# Patient Record
Sex: Female | Born: 1935 | Race: White | Hispanic: No | Smoking: Former smoker
Health system: Southern US, Community
[De-identification: ages and names within clinical notes are randomized; demographics above are authoritative.]

## PROBLEM LIST (undated history)

## (undated) DIAGNOSIS — F32A Depression, unspecified: Secondary | ICD-10-CM

## (undated) DIAGNOSIS — M545 Low back pain, unspecified: Secondary | ICD-10-CM

## (undated) DIAGNOSIS — F329 Major depressive disorder, single episode, unspecified: Secondary | ICD-10-CM

## (undated) DIAGNOSIS — L570 Actinic keratosis: Secondary | ICD-10-CM

## (undated) DIAGNOSIS — M722 Plantar fascial fibromatosis: Secondary | ICD-10-CM

## (undated) DIAGNOSIS — K5790 Diverticulosis of intestine, part unspecified, without perforation or abscess without bleeding: Secondary | ICD-10-CM

## (undated) DIAGNOSIS — J449 Chronic obstructive pulmonary disease, unspecified: Secondary | ICD-10-CM

## (undated) DIAGNOSIS — E785 Hyperlipidemia, unspecified: Secondary | ICD-10-CM

## (undated) DIAGNOSIS — I10 Essential (primary) hypertension: Secondary | ICD-10-CM

## (undated) DIAGNOSIS — H35351 Cystoid macular degeneration, right eye: Secondary | ICD-10-CM

## (undated) HISTORY — DX: Hyperlipidemia, unspecified: E78.5

## (undated) HISTORY — PX: ABDOMINAL HYSTERECTOMY: SHX81

## (undated) HISTORY — PX: VESICOVAGINAL FISTULA CLOSURE W/ TAH: SUR271

## (undated) HISTORY — DX: Low back pain: M54.5

## (undated) HISTORY — DX: Actinic keratosis: L57.0

## (undated) HISTORY — DX: Low back pain, unspecified: M54.50

## (undated) HISTORY — DX: Cystoid macular degeneration, right eye: H35.351

## (undated) HISTORY — DX: Depression, unspecified: F32.A

## (undated) HISTORY — DX: Chronic obstructive pulmonary disease, unspecified: J44.9

## (undated) HISTORY — DX: Plantar fascial fibromatosis: M72.2

## (undated) HISTORY — DX: Essential (primary) hypertension: I10

## (undated) HISTORY — PX: COLONOSCOPY: SHX174

## (undated) HISTORY — DX: Diverticulosis of intestine, part unspecified, without perforation or abscess without bleeding: K57.90

## (undated) HISTORY — DX: Major depressive disorder, single episode, unspecified: F32.9

---
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
## 2004-10-21 ENCOUNTER — Ambulatory Visit: Payer: Self-pay

## 2005-09-29 ENCOUNTER — Ambulatory Visit: Payer: Self-pay

## 2006-03-08 ENCOUNTER — Ambulatory Visit: Payer: Self-pay | Admitting: Ophthalmology

## 2006-03-14 ENCOUNTER — Ambulatory Visit: Payer: Self-pay | Admitting: Ophthalmology

## 2006-10-05 ENCOUNTER — Ambulatory Visit: Payer: Self-pay

## 2007-01-18 ENCOUNTER — Ambulatory Visit: Payer: Self-pay

## 2007-06-29 ENCOUNTER — Ambulatory Visit: Payer: Self-pay

## 2007-10-18 ENCOUNTER — Ambulatory Visit: Payer: Self-pay

## 2008-02-05 ENCOUNTER — Ambulatory Visit: Payer: Self-pay | Admitting: Internal Medicine

## 2008-02-25 ENCOUNTER — Ambulatory Visit: Payer: Self-pay | Admitting: Gastroenterology

## 2008-07-03 ENCOUNTER — Ambulatory Visit: Payer: Self-pay | Admitting: Internal Medicine

## 2008-11-05 ENCOUNTER — Ambulatory Visit: Payer: Self-pay | Admitting: Internal Medicine

## 2009-02-26 ENCOUNTER — Ambulatory Visit: Payer: Self-pay | Admitting: Gastroenterology

## 2009-09-23 ENCOUNTER — Ambulatory Visit: Payer: Self-pay | Admitting: Internal Medicine

## 2009-11-10 ENCOUNTER — Ambulatory Visit: Payer: Self-pay | Admitting: Internal Medicine

## 2010-03-02 ENCOUNTER — Ambulatory Visit: Payer: Self-pay | Admitting: Internal Medicine

## 2010-06-02 ENCOUNTER — Encounter: Payer: Self-pay | Admitting: Cardiology

## 2010-09-09 ENCOUNTER — Encounter: Payer: Self-pay | Admitting: Cardiology

## 2010-09-14 ENCOUNTER — Ambulatory Visit: Payer: Self-pay | Admitting: Cardiology

## 2010-09-14 DIAGNOSIS — R079 Chest pain, unspecified: Secondary | ICD-10-CM

## 2010-09-18 HISTORY — PX: NM MYOVIEW LTD: HXRAD82

## 2010-09-28 ENCOUNTER — Ambulatory Visit: Payer: Self-pay | Admitting: Cardiovascular Disease

## 2010-09-28 ENCOUNTER — Ambulatory Visit: Payer: Self-pay | Admitting: Cardiology

## 2010-09-30 ENCOUNTER — Ambulatory Visit: Payer: Self-pay | Admitting: Cardiovascular Disease

## 2010-09-30 DIAGNOSIS — R0602 Shortness of breath: Secondary | ICD-10-CM | POA: Insufficient documentation

## 2010-09-30 DIAGNOSIS — E785 Hyperlipidemia, unspecified: Secondary | ICD-10-CM | POA: Insufficient documentation

## 2010-11-17 ENCOUNTER — Ambulatory Visit: Payer: Self-pay | Admitting: Internal Medicine

## 2011-01-18 NOTE — Assessment & Plan Note (Signed)
Summary: EC6/AMD   Primary Provider:  Duncan Dull, M.D.  CC:  f/u after stress test.  History of Present Illness: 76 yo with history of HTN and hyperlipidemia , episodes of left arm tingling, With recent stress test at [facility] who presents for routine followup.  She had a treadmill Myoview. She only exercised for a very short period, 2 minutes though she did achieve her target heart rate. There is no ischemia seen on perfusion imaging. Normal ejection fraction. Overall a low risk study.  She presents today and reports that she now has fever. As Monday, she has had worsening cough, congestion. She feels that from her chest line up, she cannot breathe. She reports having previous episodes last year requiring antibiotics and prednisone. She states having bronchopneumonia in the past. She does have a long smoking history though did stop smoking 20 years ago She denies any chest pain.  ECG: NSR, normal  Labs (6/11): K 4.1, creatinine 0.9, LFTs normal, TSH normal Labs (9/11): HDL 70, LDL 112, TGs 148   Current Medications (verified): 1)  Hydrochlorothiazide 12.5 Mg Tabs (Hydrochlorothiazide) .... Take One Tablet By Mouth Daily. 2)  Simvastatin 40 Mg Tabs (Simvastatin) .... One Tablet At Bedtime 3)  Omeprazole 40 Mg Cpdr (Omeprazole) .... One Tablet Once Daily 4)  Vasotec 5 Mg Tabs (Enalapril Maleate) .... One Tablet Once Daily 5)  Zoloft 25 Mg Tabs (Sertraline Hcl) .... Take 1 Tablet By Mouth Once A Day As Needed 6)  Alprazolam 0.5 Mg Tabs (Alprazolam) .... One Tablet Once Daily 7)  Proventil Hfa 108 (90 Base) Mcg/act Aers (Albuterol Sulfate) .... As Needed 8)  Nystatin 100000 Unit/gm Oint (Nystatin) 9)  Aspir-Low 81 Mg Tbec (Aspirin) .... One Tablet Once Daily 10)  Vitamin D3 400 Unit Tabs (Cholecalciferol) .... One Tablet Once Daily  Allergies (verified): 1)  ! Codeine 2)  ! Sulfa 3)  ! Darvocet  Past History:  Past Medical History: Last updated: 09/14/2010 1. cholelithiasis:  asymptomatic 2. osteoporosis 3. plantar fascitis/Morton's Neuroma 4. Actinic Keratosis: Back, Upper extremities: s/p liquid nitro 01/29/2006 5. diverticulitis 6. colonoscopy March 2009, one sigmoid polyp, no tics 7. Hyperlipidemia 8. Hypertension 9. Depression 10. Hysterectomy 11. Low back pain  Past Surgical History: Last updated: 09/14/2010 hysterectomy  Family History: Last updated: 09/14/2010 Mother:  CAD; first MI in 29's, s/p CABG x 3, [place] ([facility]) Family History of Cancer: Mother, nonmetatastic 50. Father: Aortic Aneurysm, mini strokes, dementia  Social History: Last updated: 09/14/2010 Widowed, lives in [place] Tobacco Use - Former. Quit 20 years ago. Alcohol Use - no Regular Exercise - yes--works in yard. Drug Use - no  Risk Factors: Exercise: yes (09/14/2010)  Risk Factors: Smoking Status: quit (09/14/2010)  Review of Systems       The patient complains of dyspnea on exertion.  The patient denies fever, weight loss, weight gain, vision loss, decreased hearing, hoarseness, chest pain, syncope, peripheral edema, prolonged cough, abdominal pain, incontinence, muscle weakness, depression, and enlarged lymph nodes.         cough, "can't breath"  Vital Signs:  Patient profile:   76 year old female Height:      61 inches Weight:      126 pounds BMI:     23.89 Pulse rate:   96 / minute BP sitting:   124 / 72  (left arm) Cuff size:   regular  Vitals Entered By: Benedict Needy, RN (September 30, 2010 12:08 PM)  Physical Exam  General:  Well developed, well nourished,  in no acute distress. thin elderly Head:  normocephalic and atraumatic Neck:  Neck supple, no JVD. No masses, thyromegaly or abnormal cervical nodes. Lungs:  mildly decreased breath sounds throughout, moderately decreased at the bases Heart:  Non-displaced PMI, chest non-tender; regular rate and rhythm, S1, S2 without murmurs, rubs or gallops. Carotid upstroke normal, no bruit. Pedals  normal pulses. No edema, no varicosities. Abdomen:  Bowel sounds positive; abdomen soft and non-tender without masses Msk:  Back normal, normal gait. Muscle strength and tone normal. Pulses:  pulses normal in all 4 extremities Extremities:  No clubbing or cyanosis. Neurologic:  Alert and oriented x 3. Skin:  Intact without lesions or rashes. Psych:  Normal affect.   Impression & Recommendations:  Problem # 1:  DYSPNEA (ICD-786.05) she has worsening shortness of breath, cough, fever. Symptoms are similar to previous episodes of bronchitis though I cannot exclude a viral etiology. Given her significant COPD and smoking history, we will start her on a Z-Pak and prednisone taper. She would like to start today and not wait until tomorrow when she sees Dr. Lottie Mussel. She wonders if she might feel well enough tomorrow to not have to go to the doctor thought suggested that she do go in case she does not get better or the next several days.  Her updated medication list for this problem includes:    Hydrochlorothiazide 12.5 Mg Tabs (Hydrochlorothiazide) .Marland Kitchen... Take one tablet by mouth daily.    Vasotec 5 Mg Tabs (Enalapril maleate) ..... One tablet once daily    Aspir-low 81 Mg Tbec (Aspirin) ..... One tablet once daily  Problem # 2:  HYPERLIPIDEMIA-MIXED (ICD-272.4) Cholesterol is well controlled on her current medication regimen.  Her updated medication list for this problem includes:    Simvastatin 40 Mg Tabs (Simvastatin) ..... One tablet at bedtime  Problem # 3:  CHEST PAIN (ICD-786.50) No recent episodes of chest pain. Her left arm numbness and tingling has been stable. This is likely noncardiac and possibly neurologic in the ulnar nerve distribution.  Her Myoview pictures looked excellent with no significant ischemia noted. No further testing needed.  Her updated medication list for this problem includes:    Vasotec 5 Mg Tabs (Enalapril maleate) ..... One tablet once daily    Aspir-low  81 Mg Tbec (Aspirin) ..... One tablet once daily

## 2011-01-18 NOTE — Assessment & Plan Note (Signed)
Summary: NEW PT   Visit Type:  Initial Consult Primary Provider:  Duncan Dull, M.D.  CC:  c/o left and finger numbness; if massages the symptoms go away.  Denies chest pain.  Has shortness of breath that comes and goes..  History of Present Illness: 76 yo with history of HTN and hyperlipidemia presents for evaluation of left arm tingling.  Her entire left arm + the 4th and 5th fingers on the left hand have been tingling periodically.  This has been going on over a 4 week period and has happened 4 times total.  Each time she has been sitting down watching TV. The tingling resolves eventually after she massages the arm.  She does not have her arm in any particular position when this happens.  She does not get arm tingling with exertion.  She lives on an acre of land and does a fair amount of yardwork.  She is short of breath walking up a hill or up steps.  No problems walking on flat ground.  No chest pain.   ECG: NSR, normal  Labs (6/11): K 4.1, creatinine 0.9, LFTs normal, TSH normal Labs (9/11): HDL 70, LDL 112, TGs 148  Preventive Screening-Counseling & Management  Caffeine-Diet-Exercise     Does Patient Exercise: yes      Drug Use:  no.    Current Medications (verified): 1)  Hydrochlorothiazide 12.5 Mg Tabs (Hydrochlorothiazide) .... Take One Tablet By Mouth Daily. 2)  Simvastatin 40 Mg Tabs (Simvastatin) .... One Tablet At Bedtime 3)  Omeprazole 40 Mg Cpdr (Omeprazole) .... One Tablet Once Daily 4)  Vasotec 5 Mg Tabs (Enalapril Maleate) .... One Tablet Once Daily 5)  Zoloft 25 Mg Tabs (Sertraline Hcl) .... One Tablet Once Daily 6)  Allegra Allergy 180 Mg Tabs (Fexofenadine Hcl) .... One Tablet Once Daily 7)  Alprazolam 0.5 Mg Tabs (Alprazolam) .... One Tablet Once Daily 8)  Proventil Hfa 108 (90 Base) Mcg/act Aers (Albuterol Sulfate) .... As Needed 9)  Nystatin 100000 Unit/gm Oint (Nystatin) 10)  Aspir-Low 81 Mg Tbec (Aspirin) .... One Tablet Once Daily 11)  Vitamin D3 400 Unit  Tabs (Cholecalciferol) .... One Tablet Once Daily  Allergies (verified): No Known Drug Allergies  Past History:  Family History: Last updated: 09/14/2010 Mother:  CAD; first MI in 66's, s/p CABG x 3, Glower ([facility]) Family History of Cancer: Mother, nonmetatastic 76. Father: Aortic Aneurysm, mini strokes, dementia  Social History: Last updated: 09/14/2010 Widowed, lives in [place] Tobacco Use - Former. Quit 20 years ago. Alcohol Use - no Regular Exercise - yes--works in yard. Drug Use - no  Risk Factors: Exercise: yes (09/14/2010)  Risk Factors: Smoking Status: quit (09/14/2010)  Past Medical History: 1. cholelithiasis: asymptomatic 2. osteoporosis 3. plantar fascitis/Morton's Neuroma 4. Actinic Keratosis: Back, Upper extremities: s/p liquid nitro 01/29/2006 5. diverticulitis 6. colonoscopy March 2009, one sigmoid polyp, no tics 7. Hyperlipidemia 8. Hypertension 9. Depression 10. Hysterectomy 11. Low back pain  Past Surgical History: hysterectomy  Family History: Reviewed history from 09/14/2010 and no changes required. Mother:  CAD; first MI in 65's, s/p CABG x 3, [place]lower ([facility]) Family History of Cancer: Mother, nonmetatastic 24. Father: Aortic Aneurysm, mini strokes, dementia  Social History: Widowed, lives in [place] Tobacco Use - Former. Quit 20 years ago. Alcohol Use - no Regular Exercise - yes--works in yard. Drug Use - no Does Patient Exercise:  yes Drug Use:  no  Review of Systems       All systems reviewed and negative except  as per HPI.   Vital Signs:  Patient profile:   76 year old female Height:      61 inches Weight:      123 pounds BMI:     23.32 Pulse rate:   75 / minute BP sitting:   102 / 62  (left arm) Cuff size:   regular  Vitals Entered By: Bishop Dublin, CMA (September 14, 2010 2:15 PM)  Physical Exam  General:  Well developed, well nourished, in no acute distress. Head:  normocephalic and atraumatic Nose:  no  deformity, discharge, inflammation, or lesions Mouth:  Teeth, gums and palate normal. Oral mucosa normal. Neck:  Neck supple, no JVD. No masses, thyromegaly or abnormal cervical nodes. Lungs:  Clear bilaterally to auscultation and percussion. Heart:  Non-displaced PMI, chest non-tender; regular rate and rhythm, S1, S2 without murmurs, rubs or gallops. Carotid upstroke normal, no bruit. Pedals normal pulses. No edema, no varicosities. Abdomen:  Bowel sounds positive; abdomen soft and non-tender without masses, organomegaly, or hernias noted. No hepatosplenomegaly. Extremities:  No clubbing or cyanosis. Neurologic:  Alert and oriented x 3. Skin:  Intact without lesions or rashes. Psych:  Normal affect.   Impression & Recommendations:  Problem # 1:  ARM TINGLING Patient is getting arm tingling episodes on left typically when sitting down to wach TV.  Given the involvement of the left 4th and 5th fingers, I wonder if this could be an ulnar neuropathy, though would expect only hand and forearm symptoms (not entire arm).  She does have some risk factors for coronary disease, including HTN and hyperlipidemia.  Given concern for possible atypical coronary disease presentation, will arrange for ETT-myoview.  If this is normal, no further cardiac workup.   Other Orders: Nuclear Stress Test (Nuc Stress Test)  Patient Instructions: 1)  Your physician recommends that you schedule a follow-up appointment in: 2 weeks 2)  Your physician has requested that you have an exercise stress myoview.  For further information please visit [email or web address]  Please follow instruction sheet, as given. 3)  OCT 4TH ARRIVE AT 11:45

## 2011-03-29 ENCOUNTER — Ambulatory Visit: Payer: Self-pay | Admitting: Internal Medicine

## 2011-07-06 ENCOUNTER — Encounter: Payer: Self-pay | Admitting: Cardiovascular Disease

## 2011-08-12 ENCOUNTER — Telehealth: Payer: Self-pay | Admitting: Internal Medicine

## 2011-08-12 NOTE — Telephone Encounter (Signed)
Pt wants to go to [facility] to get her labs done Monday.  She has an appoinment with dr Darrick Huntsman on Tuesday 8/28  Please call pt to advise her  thanks

## 2011-08-15 ENCOUNTER — Other Ambulatory Visit: Payer: Self-pay | Admitting: Internal Medicine

## 2011-08-15 ENCOUNTER — Other Ambulatory Visit: Payer: Self-pay

## 2011-08-15 DIAGNOSIS — E785 Hyperlipidemia, unspecified: Secondary | ICD-10-CM

## 2011-08-15 DIAGNOSIS — R5383 Other fatigue: Secondary | ICD-10-CM

## 2011-08-15 NOTE — Telephone Encounter (Signed)
Patient is asking if order for labs can be sent to [facility] so that she can have her labs drawn there today. Her appt is with you tomorrow.

## 2011-08-16 ENCOUNTER — Encounter: Payer: Self-pay | Admitting: Internal Medicine

## 2011-08-16 ENCOUNTER — Ambulatory Visit (INDEPENDENT_AMBULATORY_CARE_PROVIDER_SITE_OTHER): Payer: Medicare Other | Admitting: Internal Medicine

## 2011-08-16 VITALS — BP 129/85 | HR 79 | Temp 98.8°F | Resp 18 | Wt 133.2 lb

## 2011-08-16 DIAGNOSIS — G4700 Insomnia, unspecified: Secondary | ICD-10-CM | POA: Insufficient documentation

## 2011-08-16 DIAGNOSIS — G8929 Other chronic pain: Secondary | ICD-10-CM

## 2011-08-16 DIAGNOSIS — R0989 Other specified symptoms and signs involving the circulatory and respiratory systems: Secondary | ICD-10-CM

## 2011-08-16 DIAGNOSIS — F419 Anxiety disorder, unspecified: Secondary | ICD-10-CM

## 2011-08-16 DIAGNOSIS — R0602 Shortness of breath: Secondary | ICD-10-CM

## 2011-08-16 DIAGNOSIS — F411 Generalized anxiety disorder: Secondary | ICD-10-CM

## 2011-08-16 MED ORDER — ALPRAZOLAM 1 MG PO TABS: 1.0000 mg | ORAL_TABLET | Freq: Every evening | ORAL | Status: DC | PRN

## 2011-08-16 NOTE — Assessment & Plan Note (Signed)
Secondary to persistent anxiety resulting from estranged relationship with daughter.  She can in crease her alprazolam to 1 mg at bedtime as a trial.

## 2011-08-16 NOTE — Assessment & Plan Note (Signed)
No current symptoms at rest, some exacerbations with recent exertion during hot weather activities.  Continue bronchodilator maintenance therapy and advised to refrain from outdoor activities during summer days when temps are over 85 degrees.

## 2011-08-16 NOTE — Patient Instructions (Signed)
You may increase your alprazolam to 2 tablets at  Bedtime ( = 1 mg ) .Marland Kitchen If this works,  Stage manager the prescription th atI gave you today for 1 mg tablets.  Call me in 1 week if no improvement.

## 2011-08-16 NOTE — Progress Notes (Signed)
  Subjective:    Patient ID: Caitlin Mason, female    DOB: 10/25/1935, 76 y.o.   MRN: [id number]  HPI 76 YO white female with a history of COPD, hypertension and anxiety disorder who presents for general followup.  Due to an estrangement from her daughter she has been having increased insomnia and anxiety. No other complaints.  She still mows her own lawn using a riding mower and has been generally healthy this summer.     Review of Systems  Constitutional: Negative for fever, chills, appetite change, fatigue and unexpected weight change.  HENT: Negative for hearing loss, ear pain, nosebleeds, congestion, sore throat, facial swelling, rhinorrhea, sneezing, mouth sores, trouble swallowing, neck pain, neck stiffness, voice change, postnasal drip, sinus pressure, tinnitus and ear discharge.   Eyes: Negative for pain, discharge, redness and visual disturbance.  Respiratory: Negative for cough, chest tightness, shortness of breath, wheezing and stridor.   Cardiovascular: Negative for chest pain, palpitations and leg swelling.  Gastrointestinal: Negative for nausea, vomiting, abdominal pain, diarrhea, constipation, blood in stool, abdominal distention and anal bleeding.  Genitourinary: Negative for dysuria and flank pain.  Musculoskeletal: Negative for myalgias, arthralgias and gait problem.  Skin: Negative for color change and rash.  Neurological: Negative for dizziness, weakness, light-headedness and headaches.  Hematological: Negative for adenopathy. Does not bruise/bleed easily.  Psychiatric/Behavioral: Negative for suicidal ideas, sleep disturbance and dysphoric mood. The patient is not nervous/anxious.        Objective:   Physical Exam  Constitutional: She is oriented to person, place, and time. She appears well-developed and well-nourished.  HENT:  Mouth/Throat: Oropharynx is clear and moist.  Eyes: EOM are normal. Pupils are equal, round, and reactive to light. No scleral icterus.    Neck: Normal range of motion. Neck supple. No JVD present. No thyromegaly present.  Cardiovascular: Normal rate, regular rhythm, normal heart sounds and intact distal pulses.   Pulmonary/Chest: Effort normal and breath sounds normal.  Abdominal: Soft. Bowel sounds are normal. She exhibits no mass. There is no tenderness.  Musculoskeletal: Normal range of motion. She exhibits no edema.  Lymphadenopathy:    She has no cervical adenopathy.  Neurological: She is alert and oriented to person, place, and time.  Skin: Skin is warm and dry.  Psychiatric: She has a normal mood and affect.          Assessment & Plan:

## 2011-08-17 NOTE — Telephone Encounter (Signed)
Done

## 2011-08-18 ENCOUNTER — Telehealth: Payer: Self-pay | Admitting: Internal Medicine

## 2011-08-18 NOTE — Telephone Encounter (Signed)
Pt called to get results of labs done on Monday 08/15/11 @ [facility]  Please call Friday 8/31

## 2011-08-19 NOTE — Telephone Encounter (Signed)
I have requested labs to be sent over from [facility].  Once they are reviewed I will call patient back.

## 2011-08-25 ENCOUNTER — Telehealth: Payer: Self-pay | Admitting: Internal Medicine

## 2011-08-25 NOTE — Telephone Encounter (Signed)
Notified patient of her lab results

## 2011-08-25 NOTE — Telephone Encounter (Signed)
Message copied by Edd Fabian on Thu Aug 25, 2011  1:02 PM ------      Message from: Duncan Dull      Created: Mon Aug 22, 2011 10:29 PM      Regarding: labs       Her labns were normal including cholesterol, liver, kidney and thyroid

## 2011-09-02 ENCOUNTER — Encounter: Payer: Self-pay | Admitting: Internal Medicine

## 2011-09-30 ENCOUNTER — Other Ambulatory Visit: Payer: Self-pay | Admitting: Internal Medicine

## 2011-09-30 DIAGNOSIS — F419 Anxiety disorder, unspecified: Secondary | ICD-10-CM

## 2011-09-30 MED ORDER — ALPRAZOLAM 1 MG PO TABS: 0.5000 mg | ORAL_TABLET | Freq: Every evening | ORAL | Status: DC | PRN

## 2011-10-19 ENCOUNTER — Telehealth: Payer: Self-pay | Admitting: Internal Medicine

## 2011-10-19 NOTE — Telephone Encounter (Signed)
Spoke with patient, she says that she has been having a lot of pain in her legs. Feels like her bones are dry and brittle. She thinks it is from the simvastatin. She says that her pharmacist told her to stop her simvastatin for a week so she did and she said that her pain improved a lot until she started taking it again. She is asking if there is something else that she could try. Please advise.

## 2011-10-19 NOTE — Telephone Encounter (Signed)
Yes, we can try crestor 5 mg one tablet daily if her insurance will pay for it .  We may have samples and a coupon for decreased copay per lunch rep today.

## 2011-10-19 NOTE — Telephone Encounter (Signed)
[phone]  Pt called she has questions about her simvastatin.  Her bones feels brittle.  Last night she woke up with her hips and legs hurting very bad she had to take 1/2 of goodies powder.

## 2011-10-20 MED ORDER — ROSUVASTATIN CALCIUM 5 MG PO TABS: 5.0000 mg | ORAL_TABLET | Freq: Every day | ORAL | Status: DC

## 2011-10-20 NOTE — Telephone Encounter (Signed)
Patient notified. Rx for crestor sent to pharmacy. Samples left up front for patient to pick up. Patient will pick them up tomorrow.

## 2011-11-07 ENCOUNTER — Other Ambulatory Visit: Payer: Self-pay | Admitting: Internal Medicine

## 2011-11-07 MED ORDER — OMEPRAZOLE 40 MG PO CPDR: 40.0000 mg | DELAYED_RELEASE_CAPSULE | Freq: Every day | ORAL | Status: DC

## 2011-11-07 MED ORDER — HYDROCHLOROTHIAZIDE 12.5 MG PO TABS: 12.5000 mg | ORAL_TABLET | Freq: Every day | ORAL | Status: DC

## 2011-11-28 ENCOUNTER — Other Ambulatory Visit: Payer: Self-pay | Admitting: Internal Medicine

## 2011-11-30 MED ORDER — SERTRALINE HCL 25 MG PO TABS
25.0000 mg | ORAL_TABLET | Freq: Every day | ORAL | Status: DC
Start: 2011-11-28 — End: 2012-11-27

## 2011-12-04 LAB — HM MAMMOGRAPHY: HM Mammogram: NORMAL

## 2011-12-07 ENCOUNTER — Ambulatory Visit: Payer: Self-pay | Admitting: Internal Medicine

## 2012-01-02 ENCOUNTER — Encounter: Payer: Self-pay | Admitting: Internal Medicine

## 2012-01-16 ENCOUNTER — Other Ambulatory Visit: Payer: Self-pay | Admitting: *Deleted

## 2012-01-17 NOTE — Telephone Encounter (Signed)
Opened in error

## 2012-01-19 ENCOUNTER — Telehealth: Payer: Self-pay | Admitting: *Deleted

## 2012-01-19 NOTE — Telephone Encounter (Signed)
Prior Berkley Harvey is needed for omeprazole, for tier exception. Form is in your red folder.

## 2012-01-31 NOTE — Telephone Encounter (Signed)
Prior auth given for omeprazole, approval letter given to doctor for signature and scanning.

## 2012-02-09 ENCOUNTER — Telehealth: Payer: Self-pay | Admitting: Internal Medicine

## 2012-02-09 MED ORDER — ENALAPRIL MALEATE 5 MG PO TABS: 5.0000 mg | ORAL_TABLET | Freq: Every day | ORAL | Status: DC

## 2012-02-09 NOTE — Telephone Encounter (Signed)
Medicine sent to pharmacy.  Patient advised.

## 2012-02-23 ENCOUNTER — Telehealth: Payer: Self-pay | Admitting: Internal Medicine

## 2012-02-23 MED ORDER — GUAIFENESIN-CODEINE 100-10 MG/5ML PO SYRP: 5.0000 mL | ORAL_SOLUTION | Freq: Three times a day (TID) | ORAL | Status: DC | PRN

## 2012-02-23 NOTE — Telephone Encounter (Signed)
Her symptoms suggest the same viral URI that everybody in [place] has currently and she does not need antibiotics unless she is no better by Monday. Have her take benadryl 25 mg every 8 hours,  Sudafed PE 10 mg every 8 hours and call her in cheratussin which has codein in it so she can sleep at night,.  cheratussin rx placed in [facility].  If she doesn't want it bc of the codeine,  You can call her in tessalon 200 mg every 8 hours prn cough  #30 1 refill

## 2012-02-23 NOTE — Telephone Encounter (Signed)
[phone] Pt called wanting to know if dr Darrick Huntsman can call her in something for no fever,chest congestion,head congestion,cough pt has been taking tussin plain no decongestant.  This started 4am onTuesday am [facility]. Pt stated she didn't want to come in if she didn't have to, but if dr Darrick Huntsman wants her to come in she will

## 2012-02-24 NOTE — Telephone Encounter (Signed)
In Caitlin Mason's note patient stated she didn't want to be seen, just something called in.  She told Caitlin Mason she did not want the cough medicine called in.

## 2012-02-24 NOTE — Telephone Encounter (Signed)
Patient called in this morning very upset that she wasn't called back yesterday, I explained that nurse didn't get this until this morning. I did tell her what dr. Darrick Huntsman had wrote and she became even more upset and said "Just because everyone else in [place] had this didn't mean that is what she had and she knew what she needed and that was a SHOT".  I did explain to her that I was sorry she was upset and tried to reassure her, she did take down the information of the Benadryl and sudafed, however she said she wasn't taking any of it.  "she would just suffer through it, even though she knows she just needs a shot".  She doesn't want either cough medication called in.

## 2012-03-06 ENCOUNTER — Other Ambulatory Visit: Payer: Self-pay | Admitting: *Deleted

## 2012-03-06 MED ORDER — OMEPRAZOLE 40 MG PO CPDR: 40.0000 mg | DELAYED_RELEASE_CAPSULE | Freq: Every day | ORAL | Status: DC

## 2012-03-12 ENCOUNTER — Other Ambulatory Visit: Payer: Self-pay | Admitting: Internal Medicine

## 2012-03-12 MED ORDER — HYDROCHLOROTHIAZIDE 12.5 MG PO TABS: 12.5000 mg | ORAL_TABLET | Freq: Every day | ORAL | Status: DC

## 2012-04-03 ENCOUNTER — Ambulatory Visit (INDEPENDENT_AMBULATORY_CARE_PROVIDER_SITE_OTHER): Payer: Medicare Other | Admitting: Internal Medicine

## 2012-04-03 ENCOUNTER — Encounter: Payer: Self-pay | Admitting: Internal Medicine

## 2012-04-03 DIAGNOSIS — R5383 Other fatigue: Secondary | ICD-10-CM

## 2012-04-03 DIAGNOSIS — G4700 Insomnia, unspecified: Secondary | ICD-10-CM

## 2012-04-03 DIAGNOSIS — E785 Hyperlipidemia, unspecified: Secondary | ICD-10-CM

## 2012-04-03 DIAGNOSIS — H35351 Cystoid macular degeneration, right eye: Secondary | ICD-10-CM | POA: Insufficient documentation

## 2012-04-03 DIAGNOSIS — Z79899 Other long term (current) drug therapy: Secondary | ICD-10-CM

## 2012-04-03 DIAGNOSIS — R5381 Other malaise: Secondary | ICD-10-CM

## 2012-04-03 DIAGNOSIS — J449 Chronic obstructive pulmonary disease, unspecified: Secondary | ICD-10-CM

## 2012-04-03 DIAGNOSIS — E559 Vitamin D deficiency, unspecified: Secondary | ICD-10-CM

## 2012-04-03 DIAGNOSIS — R079 Chest pain, unspecified: Secondary | ICD-10-CM

## 2012-04-03 DIAGNOSIS — Z0000 Encounter for general adult medical examination without abnormal findings: Secondary | ICD-10-CM

## 2012-04-03 NOTE — Progress Notes (Signed)
Patient ID: Caitlin Mason, female   DOB: 04/13/1935, 77 y.o.   MRN: [id number] The patient is here for annual Medicare wellness examination and management of other chronic and acute problems.  She recently had a prolonged URI which went untreated because she declined an office visit.  She was initially upset that I would not call her in an antibiotic without seeing her,.  She denies residual symptoms, and has no history of fever, purulent sputum or wheezing.  She continues to express feelings of sadness and anxiety over relationships in her immediate and extended family that have disappointed her,. And is taking zoloft at a very low dose with some improvement in mood    The risk factors are reflected in the social history.  The roster of all physicians providing medical care to patient - is listed in the Snapshot section of the chart.  Activities of daily living:  The patient is 100% independent in all ADLs: dressing, toileting, feeding as well as independent mobility  Home safety : The patient has smoke detectors in the home. They wear seatbelts.  There are no firearms at home. There is no violence in the home.   There is no risks for hepatitis, STDs or HIV. There is no   history of blood transfusion. They have no travel history to infectious disease endemic areas of the world.  The patient has seen their dentist in the last six month. They have seen their eye doctor in the last year. They admit to slight hearing difficulty with regard to whispered voices and some television programs.  They have deferred audiologic testing in the last year.  They do not  have excessive sun exposure. Discussed the need for sun protection: hats, long sleeves and use of sunscreen if there is significant sun exposure.   Diet: the importance of a healthy diet is discussed. They do have a healthy diet.  The benefits of regular aerobic exercise were discussed. She walks 4 times per week ,  20 minutes.   Depression  screen: there are no signs or vegative symptoms of depression- irritability, change in appetite, anhedonia, sadness/tearfullness.  Cognitive assessment: the patient manages all their financial and personal affairs and is actively engaged. They could relate day,date,year and events; recalled 2/3 objects at 3 minutes; performed clock-face test normally.  The following portions of the patient's history were reviewed and updated as appropriate: allergies, current medications, past family history, past medical history,  past surgical history, past social history  and problem list.  Visual acuity was not assessed per patient preference since she has regular follow up with her ophthalmologist. Hearing and body mass index were assessed and reviewed.   During the course of the visit the patient was educated and counseled about appropriate screening and preventive services including : fall prevention , diabetes screening, nutrition counseling, colorectal cancer screening, and recommended immunizations.    BP 122/60  Pulse 70  Temp(Src) 98.2 F (36.8 C) (Oral)  Resp 16  Ht 5\' 1"  (1.549 m)  Wt 130 lb 4 oz (59.081 kg)  BMI 24.61 kg/m2  SpO2 95%   General appearance: alert, cooperative and appears stated age Head: Normocephalic, without obvious abnormality, atraumatic Eyes: conjunctivae/corneas clear. PERRL, EOM's intact. Fundi benign. Ears: normal TM's and external ear canals both ears Nose: Nares normal. Septum midline. Mucosa normal. No drainage or sinus tenderness. Throat: lips, mucosa, and tongue normal; teeth and gums normal Neck: no adenopathy, no carotid bruit, no JVD, supple, symmetrical, trachea midline and thyroid  not enlarged, symmetric, no tenderness/mass/nodules Lungs: clear to auscultation bilaterally Breasts: normal appearance, no masses or tenderness Heart: regular rate and rhythm, S1, S2 normal, no murmur, click, rub or gallop Abdomen: soft, non-tender; bowel sounds normal; no  masses,  no organomegaly Extremities: extremities normal, atraumatic, no cyanosis or edema Pulses: 2+ and symmetric Skin: Skin color, texture, turgor normal. No rashes or lesions Neurologic: Alert and oriented X 3, normal strength and tone. Normal symmetric reflexes. Normal coordination and gait.   Assessment and Plan  COPD (chronic obstructive pulmonary disease) Moderately advanced, with recent exacerbation per patient which was not treated as she refused to be seen. Exam is normal today  CHEST PAIN With prior NM myoview in Oct 2011 negative for ischemia.   Insomnia, persistent Aggravated by anxiety over estrangement from daughter.    Annual physical exam Breast exam done, pelvic deferred.  She is s/p TAH and not sexually active. prior imaging of ovaries by CT done in 2011 for LLQ pain was normal.     Updated Medication List Outpatient Encounter Prescriptions as of 04/03/2012  Medication Sig Dispense Refill  . ALPRAZolam (XANAX) 1 MG tablet Take 0.5 tablets (0.5 mg total) by mouth at bedtime as needed for sleep.  30 tablet  5  . aspirin 81 MG EC tablet Take 81 mg by mouth daily.        . Cholecalciferol (VITAMIN D3) 400 UNITS tablet Take 400 Units by mouth daily.        . enalapril (VASOTEC) 5 MG tablet Take 1 tablet (5 mg total) by mouth daily.  30 tablet  2  . fexofenadine (ALLEGRA) 180 MG tablet Take 180 mg by mouth daily.        . hydrochlorothiazide (HYDRODIURIL) 12.5 MG tablet Take 1 tablet (12.5 mg total) by mouth daily.  30 tablet  3  . omeprazole (PRILOSEC) 40 MG capsule Take 1 capsule (40 mg total) by mouth daily.  30 capsule  3  . sertraline (ZOLOFT) 25 MG tablet Take 1 tablet (25 mg total) by mouth daily.  30 tablet  3  . simvastatin (ZOCOR) 40 MG tablet Take 40 mg by mouth every evening.      Marland Kitchen DISCONTD: albuterol (PROVENTIL HFA) 108 (90 BASE) MCG/ACT inhaler Inhale 2 puffs into the lungs every 6 (six) hours as needed.        Marland Kitchen DISCONTD: rosuvastatin (CRESTOR) 5 MG  tablet Take 1 tablet (5 mg total) by mouth daily.  30 tablet  4

## 2012-04-04 ENCOUNTER — Encounter: Payer: Self-pay | Admitting: Internal Medicine

## 2012-04-04 DIAGNOSIS — Z0000 Encounter for general adult medical examination without abnormal findings: Secondary | ICD-10-CM | POA: Insufficient documentation

## 2012-04-04 DIAGNOSIS — J449 Chronic obstructive pulmonary disease, unspecified: Secondary | ICD-10-CM | POA: Insufficient documentation

## 2012-04-04 NOTE — Assessment & Plan Note (Signed)
With prior NM myoview in Oct 2011 negative for ischemia.

## 2012-04-04 NOTE — Assessment & Plan Note (Signed)
Moderately advanced, with recent exacerbation per patient which was not treated as she refused to be seen. Exam is normal today

## 2012-04-04 NOTE — Assessment & Plan Note (Signed)
Breast exam done, pelvic deferred.  She is s/p TAH and not sexually active. prior imaging of ovaries by CT done in 2011 for LLQ pain was normal.

## 2012-04-04 NOTE — Assessment & Plan Note (Signed)
Aggravated by anxiety over estrangement from daughter.

## 2012-04-11 ENCOUNTER — Other Ambulatory Visit (INDEPENDENT_AMBULATORY_CARE_PROVIDER_SITE_OTHER): Payer: Medicare Other | Admitting: *Deleted

## 2012-04-11 DIAGNOSIS — R5381 Other malaise: Secondary | ICD-10-CM

## 2012-04-11 DIAGNOSIS — R5383 Other fatigue: Secondary | ICD-10-CM

## 2012-04-11 DIAGNOSIS — E785 Hyperlipidemia, unspecified: Secondary | ICD-10-CM

## 2012-04-11 LAB — CBC WITH DIFFERENTIAL/PLATELET
Basophils Absolute: 0 10*3/uL (ref 0.0–0.1)
Eosinophils Absolute: 0.1 10*3/uL (ref 0.0–0.7)
Lymphocytes Relative: 26.2 % (ref 12.0–46.0)
MCHC: 33.3 g/dL (ref 30.0–36.0)
Monocytes Relative: 9.8 % (ref 3.0–12.0)
Neutro Abs: 2.3 10*3/uL (ref 1.4–7.7)
Platelets: 201 10*3/uL (ref 150.0–400.0)
RDW: 13.8 % (ref 11.5–14.6)

## 2012-04-11 LAB — COMPREHENSIVE METABOLIC PANEL
ALT: 11 U/L (ref 0–35)
AST: 24 U/L (ref 0–37)
Alkaline Phosphatase: 46 U/L (ref 39–117)
BUN: 17 mg/dL (ref 6–23)
Chloride: 102 mEq/L (ref 96–112)
Creat: 1.13 mg/dL — ABNORMAL HIGH (ref 0.50–1.10)
Potassium: 4.2 mEq/L (ref 3.5–5.3)

## 2012-04-11 LAB — LIPID PANEL
HDL: 69 mg/dL (ref >39)
LDL Cholesterol: 112 mg/dL — ABNORMAL HIGH (ref 0–99)
Total CHOL/HDL Ratio: 2.9 Ratio

## 2012-04-11 NOTE — Progress Notes (Signed)
Addended by: Jobie Quaker on: 04/11/2012 11:36 AM   Modules accepted: Orders

## 2012-04-12 LAB — VITAMIN D 25 HYDROXY (VIT D DEFICIENCY, FRACTURES): Vit D, 25-Hydroxy: 58 ng/mL (ref 30–89)

## 2012-04-12 LAB — TSH: TSH: 1.622 u[IU]/mL (ref 0.350–4.500)

## 2012-04-18 ENCOUNTER — Ambulatory Visit (INDEPENDENT_AMBULATORY_CARE_PROVIDER_SITE_OTHER): Payer: Medicare Other | Admitting: Internal Medicine

## 2012-04-18 VITALS — BP 118/72 | HR 68 | Temp 97.8°F

## 2012-04-18 DIAGNOSIS — R102 Pelvic and perineal pain: Secondary | ICD-10-CM

## 2012-04-18 DIAGNOSIS — R109 Unspecified abdominal pain: Secondary | ICD-10-CM

## 2012-04-18 DIAGNOSIS — N390 Urinary tract infection, site not specified: Secondary | ICD-10-CM

## 2012-04-18 LAB — POCT URINALYSIS DIPSTICK: pH, UA: 5.5

## 2012-04-19 ENCOUNTER — Encounter: Payer: Self-pay | Admitting: Internal Medicine

## 2012-04-19 DIAGNOSIS — R102 Pelvic and perineal pain: Secondary | ICD-10-CM | POA: Insufficient documentation

## 2012-04-19 NOTE — Progress Notes (Signed)
Patient ID: Caitlin Mason, female   DOB: 1935/09/15, 77 y.o.   MRN: [id number]   Patient Active Problem List  Diagnoses  . HYPERLIPIDEMIA-MIXED  . DYSPNEA  . CHEST PAIN  . Insomnia, persistent  . Cystoid macular degeneration of right eye  . COPD (chronic obstructive pulmonary disease)  . Annual physical exam  . Suprapubic pain, acute    Subjective:  CC:   Chief Complaint  Patient presents with  . Urinary Tract Infection    HPI:   Caitlin Mason a 77 y.o. female who presents Recurrent low suprapubic pain accompanied by frequency. Has been present for 3 days some back pain. No fevers.  has been slightly constipated. No diarrhea   Past Medical History  Diagnosis Date  . Cholelithiasis     asymptomatic  . Osteoporosis   . Plantar fasciitis     Morton's Neuroma  . Actinic keratosis     back, upper extremities: s/p liquid nitro 01/29/06  . Diverticulosis   . HLD (hyperlipidemia)   . HTN (hypertension)   . Depression   . Low back pain   . 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 COPD (chronic obstructive pulmonary disease)     Past Surgical History  Procedure Date  . Colonoscopy     3/09, one sigmoid polyp; no tics   . Vesicovaginal fistula closure w/ tah   . Abdominal hysterectomy   . Nm myoview ltd Oct 2011    normal, EF 89%, study limited by lung disease         The following portions of the patient's history were reviewed and updated as appropriate: Allergies, current medications, and problem list.    Review of Systems:   12 Pt  review of systems was negative except those addressed in the HPI,     History   Social History  . Marital Status: Married    Spouse Name: N/A    Number of Children: N/A  . Years of Education: N/A   Occupational History  . Not on file.   Social History Main Topics  . Smoking status: Former Smoker    Quit date: 08/15/1985  . Smokeless tobacco: Never Used   Comment:  quit 20 years ago   . Alcohol Use: No  . Drug Use: No  . Sexually Active: Not on file   Other Topics Concern  . Not on file   Social History Narrative   Widowed, lives in [place], gets regular exercise by working in the yard.     Objective:  BP 118/72  Pulse 68  Temp 97.8 F (36.6 C)  General appearance: alert, cooperative and appears stated age Ears: normal TM's and external ear canals both ears Throat: lips, mucosa, and tongue normal; teeth and gums normal Neck: no adenopathy, no carotid bruit, supple, symmetrical, trachea midline and thyroid not enlarged, symmetric, no tenderness/mass/nodules Back: symmetric, no curvature. ROM normal. No CVA tenderness. Lungs: clear to auscultation bilaterally Heart: regular rate and rhythm, S1, S2 normal, no murmur, click, rub or gallop Abdomen: soft, non-tender; bowel sounds normal; no masses,  no organomegaly Pulses: 2+ and symmetric Skin: Skin color, texture, turgor normal. No rashes or lesions Lymph nodes: Cervical, supraclavicular, and axillary nodes normal.  Assessment and Plan:  Suprapubic pain, acute Acute with history of recurrent episodes. Her UA is normal and she has  no signs of fever or other ystemic symptoms. Suspect this could be early diverticulitis. Recommended that she use  the lactulose to move her bowels and change to a clear liquid diet. If she has no improvement  by Friday return or call for antibiotics    Updated Medication List Outpatient Encounter Prescriptions as of 04/18/2012  Medication Sig Dispense Refill  . ALPRAZolam (XANAX) 1 MG tablet Take 0.5 tablets (0.5 mg total) by mouth at bedtime as needed for sleep.  30 tablet  5  . aspirin 81 MG EC tablet Take 81 mg by mouth daily.        . Cholecalciferol (VITAMIN D3) 400 UNITS tablet Take 400 Units by mouth daily.        . enalapril (VASOTEC) 5 MG tablet Take 1 tablet (5 mg total) by mouth daily.  30 tablet  2  . fexofenadine (ALLEGRA) 180 MG tablet Take 180  mg by mouth daily.        . hydrochlorothiazide (HYDRODIURIL) 12.5 MG tablet Take 1 tablet (12.5 mg total) by mouth daily.  30 tablet  3  . omeprazole (PRILOSEC) 40 MG capsule Take 1 capsule (40 mg total) by mouth daily.  30 capsule  3  . sertraline (ZOLOFT) 25 MG tablet Take 1 tablet (25 mg total) by mouth daily.  30 tablet  3  . simvastatin (ZOCOR) 40 MG tablet Take 40 mg by mouth every evening.         Orders Placed This Encounter  Procedures  . POCT Urinalysis Dipstick    No Follow-up on file.

## 2012-04-19 NOTE — Assessment & Plan Note (Signed)
Acute with history of recurrent episodes. Her UA is normal and she has  no signs of fever or other ystemic symptoms. Suspect this could be early diverticulitis. Recommended that she use the lactulose to move her bowels and change to a clear liquid diet. If she has no improvement  by Friday return or call for antibiotics

## 2012-04-24 ENCOUNTER — Other Ambulatory Visit: Payer: Self-pay | Admitting: Internal Medicine

## 2012-04-24 DIAGNOSIS — F419 Anxiety disorder, unspecified: Secondary | ICD-10-CM

## 2012-04-25 ENCOUNTER — Other Ambulatory Visit: Payer: Self-pay | Admitting: *Deleted

## 2012-04-25 MED ORDER — ALPRAZOLAM 1 MG PO TABS: 0.5000 mg | ORAL_TABLET | Freq: Every evening | ORAL | Status: DC | PRN

## 2012-04-25 NOTE — Telephone Encounter (Signed)
rx already pended by Darletta Moll

## 2012-05-07 ENCOUNTER — Other Ambulatory Visit: Payer: Self-pay | Admitting: *Deleted

## 2012-05-07 MED ORDER — ENALAPRIL MALEATE 5 MG PO TABS: 5.0000 mg | ORAL_TABLET | Freq: Every day | ORAL | Status: DC

## 2012-05-28 ENCOUNTER — Other Ambulatory Visit: Payer: Self-pay | Admitting: Internal Medicine

## 2012-05-28 MED ORDER — SIMVASTATIN 40 MG PO TABS: 40.0000 mg | ORAL_TABLET | Freq: Every evening | ORAL | Status: DC

## 2012-07-04 ENCOUNTER — Other Ambulatory Visit: Payer: Self-pay | Admitting: *Deleted

## 2012-07-04 ENCOUNTER — Telehealth: Payer: Self-pay | Admitting: Internal Medicine

## 2012-07-04 MED ORDER — OMEPRAZOLE 40 MG PO CPDR: 40.0000 mg | DELAYED_RELEASE_CAPSULE | Freq: Every day | ORAL | Status: DC

## 2012-07-04 NOTE — Telephone Encounter (Signed)
Caller: Georgetta/Patient; PCP: Duncan Dull; CB#: [phone]; ; ; Call regarding Cough/Congestion THE PATIENT REFUSED 911;  Has cough/congestion since 7-15. Has temp 99 oral. Is coughing up yellow mucus. Advised appointment but patient gets very mad at nurse and states "is not coming to [facility] to see a doctor. States thought MD would call in "something like she has done before". Uses [facility]/[phone].

## 2012-07-05 NOTE — Telephone Encounter (Signed)
Made her an appointment for 7.19.13 @ 9:45.

## 2012-07-06 ENCOUNTER — Encounter: Payer: Self-pay | Admitting: Internal Medicine

## 2012-07-06 ENCOUNTER — Ambulatory Visit (INDEPENDENT_AMBULATORY_CARE_PROVIDER_SITE_OTHER): Payer: Medicare Other | Admitting: Internal Medicine

## 2012-07-06 VITALS — BP 122/62 | HR 84 | Temp 98.3°F | Wt 128.0 lb

## 2012-07-06 DIAGNOSIS — J321 Chronic frontal sinusitis: Secondary | ICD-10-CM

## 2012-07-06 DIAGNOSIS — J029 Acute pharyngitis, unspecified: Secondary | ICD-10-CM

## 2012-07-06 MED ORDER — AMOXICILLIN-POT CLAVULANATE 875-125 MG PO TABS: 1.0000 | ORAL_TABLET | Freq: Two times a day (BID) | ORAL | Status: AC

## 2012-07-06 MED ORDER — LEVALBUTEROL TARTRATE 45 MCG/ACT IN AERO: 1.0000 | INHALATION_SPRAY | RESPIRATORY_TRACT | Status: DC | PRN

## 2012-07-06 MED ORDER — BENZONATATE 100 MG PO CAPS: ORAL_CAPSULE | ORAL | Status: DC

## 2012-07-06 NOTE — Patient Instructions (Addendum)
You have a sinus  Infection.  .  I am prescribing an antibiotic  And a cough tablet  to manage the infection and the cough  You may use the xopenex inhaler i gave you up to four times daily for shortness of breath   I also advise use of the following OTC meds to help with your other symptoms.   Take generic OTC benadryl 25 mg every 8 hours for the drainage,  Sudafed PE  10 to 30 mg every 8 hours for the congestion, you may substitute Afrin nasal spray for the nighttime dose of sudafed PE  If needed to prevent insomnia.  flushes your sinuses twice daily with Simply Saline (do over the sink because if you do it right you will spit out globs of mucus)  Use benzonatate capsules FOR THE COUGH.  Gargle with salt water as needed for sore throat.

## 2012-07-06 NOTE — Progress Notes (Signed)
Patient ID: Caitlin Mason, female   DOB: 07/17/1935, 77 y.o.   MRN: [id number]  Patient Active Problem List  Diagnosis  . HYPERLIPIDEMIA-MIXED  . DYSPNEA  . CHEST PAIN  . Insomnia, persistent  . Cystoid macular degeneration of right eye  . COPD (chronic obstructive pulmonary disease)  . Annual physical exam  . Suprapubic pain, acute  . Sinusitis chronic, frontal    Subjective:  CC:   Chief Complaint  Patient presents with  . Cough    Productive cough with congestion-yellowish phlegm x 5 days  . Fever    HPI:   Caitlin Mason a 77 y.o. female who presents  Past Medical History  Diagnosis Date  . Cholelithiasis     asymptomatic  . Osteoporosis   . Plantar fasciitis     Morton's Neuroma  . Actinic keratosis     back, upper extremities: s/p liquid nitro 01/29/06  . Diverticulosis   . HLD (hyperlipidemia)   . HTN (hypertension)   . Depression   . Low back pain   . 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 COPD (chronic obstructive pulmonary disease)     Past Surgical History  Procedure Date  . Colonoscopy     3/09, one sigmoid polyp; no tics   . Vesicovaginal fistula closure w/ tah   . Abdominal hysterectomy   . Nm myoview ltd Oct 2011    normal, EF 89%, study limited by lung disease         The following portions of the patient's history were reviewed and updated as appropriate: Allergies, current medications, and problem list.    Review of Systems:   Review of Systems - General ROS: positive for  - fatigue and fever negative for - weight loss ENT ROS: positive for - headaches, nasal congestion, nasal discharge, sinus pain, sneezing and sore throat negative for - epistaxis or vertigo Allergy and Immunology ROS: negative for - hives Respiratory ROS: positive for - cough and sputum changes. Negative for wheezing    History   Social History  . Marital Status: Married    Spouse Name: N/A    Number of  Children: N/A  . Years of Education: N/A   Occupational History  . Not on file.   Social History Main Topics  . Smoking status: Former Smoker    Quit date: 08/15/1985  . Smokeless tobacco: Never Used   Comment: quit 20 years ago   . Alcohol Use: No  . Drug Use: No  . Sexually Active: Not on file   Other Topics Concern  . Not on file   Social History Narrative   Widowed, lives in [place], gets regular exercise by working in the yard.     Objective:  BP 122/62  Pulse 84  Temp 98.3 F (36.8 C) (Oral)  Wt 128 lb (58.06 kg)  SpO2 93%  General appearance: alert, cooperative and appears stated age. Looks miserable Ears: normal TM's and external ear canals both ears Throat: mild tonsillar erythema without lesions. lips, mucosa, and tongue normal; teeth and gums normal Neck: no adenopathy, no carotid bruit, supple, symmetrical, trachea midline and thyroid not enlarged, symmetric, no tenderness/mass/nodules Back: symmetric, no curvature. ROM normal. No CVA tenderness. Lungs: clear to auscultation bilaterally.  GAM, no wheezing  Heart: regular rate and rhythm, S1, S2 normal, no murmur, click, rub or gallop Abdomen: soft, non-tender; bowel sounds normal; no masses,  no organomegaly Pulses: 2+ and symmetric Skin:  Skin color, texture, turgor normal. No rashes or lesions Lymph nodes: Cervical, supraclavicular, and axillary nodes normal.  Assessment and Plan:  Sinusitis chronic, frontal Given chronicity of symptoms, development of facial pain and exam consistent with bacterial URI,  Will treat with empiric antibiotics, decongestants, and saline lavage.     Updated Medication List Outpatient Encounter Prescriptions as of 07/06/2012  Medication Sig Dispense Refill  . ALPRAZolam (XANAX) 1 MG tablet Take 0.5 tablets (0.5 mg total) by mouth at bedtime as needed for sleep.  30 tablet  5  . Cholecalciferol (VITAMIN D3) 400 UNITS tablet Take 400 Units by mouth daily.        .  enalapril (VASOTEC) 5 MG tablet Take 1 tablet (5 mg total) by mouth daily.  30 tablet  2  . fexofenadine (ALLEGRA) 180 MG tablet Take 180 mg by mouth daily.        . hydrochlorothiazide (HYDRODIURIL) 12.5 MG tablet Take 1 tablet (12.5 mg total) by mouth daily.  30 tablet  3  . omeprazole (PRILOSEC) 40 MG capsule Take 1 capsule (40 mg total) by mouth daily.  30 capsule  3  . sertraline (ZOLOFT) 25 MG tablet Take 1 tablet (25 mg total) by mouth daily.  30 tablet  3  . simvastatin (ZOCOR) 40 MG tablet Take 1 tablet (40 mg total) by mouth every evening.  30 tablet  3  . amoxicillin-clavulanate (AUGMENTIN) 875-125 MG per tablet Take 1 tablet by mouth 2 (two) times daily.  14 tablet  0  . aspirin 81 MG EC tablet Take 81 mg by mouth daily.        . benzonatate (TESSALON) 100 MG capsule 1 or 2 capsules every 8 hours  60 capsule  0  . levalbuterol (XOPENEX HFA) 45 MCG/ACT inhaler Inhale 1-2 puffs into the lungs every 4 (four) hours as needed for wheezing.  1 Inhaler  12     Orders Placed This Encounter  Procedures  . POCT rapid strep A    No Follow-up on file.

## 2012-07-09 ENCOUNTER — Encounter: Payer: Self-pay | Admitting: Internal Medicine

## 2012-07-09 DIAGNOSIS — J321 Chronic frontal sinusitis: Secondary | ICD-10-CM | POA: Insufficient documentation

## 2012-07-09 NOTE — Assessment & Plan Note (Signed)
Given chronicity of symptoms, development of facial pain and exam consistent with bacterial URI,  Will treat with empiric antibiotics, decongestants, and saline lavage.   

## 2012-07-18 ENCOUNTER — Encounter: Payer: Self-pay | Admitting: Internal Medicine

## 2012-07-18 ENCOUNTER — Ambulatory Visit (INDEPENDENT_AMBULATORY_CARE_PROVIDER_SITE_OTHER): Payer: Medicare Other | Admitting: Internal Medicine

## 2012-07-18 VITALS — BP 128/62 | HR 74 | Temp 98.8°F | Resp 18 | Wt 128.8 lb

## 2012-07-18 DIAGNOSIS — J321 Chronic frontal sinusitis: Secondary | ICD-10-CM

## 2012-07-18 MED ORDER — PREDNISONE (PAK) 10 MG PO TABS: ORAL_TABLET | ORAL | Status: AC

## 2012-07-18 NOTE — Patient Instructions (Addendum)
Flush your sinuses twice daily with Simply Saline  To help remove the phlegm .Marland Kitchen  Continue allegra daily  I will add a week of prednisone for the allergies   Continue the cough capsules.    Make sure you are drink 48 ounces of water  You can try mucinex (guiafenesin ) 600 mg twice daily to help thin out the secretion s

## 2012-07-22 ENCOUNTER — Encounter: Payer: Self-pay | Admitting: Internal Medicine

## 2012-07-22 NOTE — Assessment & Plan Note (Signed)
She has no signs of persistent bacterial sinus on exam. Her symptoms appear do to persisting congestion and allergic rhinitis. I recommended an course of prednisone to help manage the inflammation along with a decongestant antihistamine and silent simply saline for sinus lavage twice daily. If symptoms do improve we'll refer her to ENT.

## 2012-07-22 NOTE — Progress Notes (Signed)
Patient ID: Caitlin Mason, female   DOB: 1935/02/26, 77 y.o.   MRN: [id number] Patient Active Problem List  Diagnosis  . HYPERLIPIDEMIA-MIXED  . Insomnia, persistent  . Cystoid macular degeneration of right eye  . COPD (chronic obstructive pulmonary disease)  . Sinusitis chronic, frontal    Subjective:  CC:   Chief Complaint  Patient presents with  . Cough  . Sinusitis    HPI:   Caitlin Mason a 77 y.o. female who presents for  followup on sinusiti/otitis. She was treated on July 22 for 7 days with broad-spectrum antibiotics and cough medicine. She continues to have persistent cough and malaise but denies any fevers facial pain purulent drainage    Past Medical History  Diagnosis Date  . Cholelithiasis     asymptomatic  . Osteoporosis   . Plantar fasciitis     Morton's Neuroma  . Actinic keratosis     back, upper extremities: s/p liquid nitro 01/29/06  . Diverticulosis   . HLD (hyperlipidemia)   . HTN (hypertension)   . Depression   . Low back pain   . 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 COPD (chronic obstructive pulmonary disease)     Past Surgical History  Procedure Date  . Colonoscopy     3/09, one sigmoid polyp; no tics   . Vesicovaginal fistula closure w/ tah   . Abdominal hysterectomy   . Nm myoview ltd Oct 2011    normal, EF 89%, study limited by lung disease         The following portions of the patient's history were reviewed and updated as appropriate: Allergies, current medications, and problem list.    Review of Systems:   12 Pt  review of systems was negative except those addressed in the HPI,     History   Social History  . Marital Status: Married    Spouse Name: N/A    Number of Children: N/A  . Years of Education: N/A   Occupational History  . Not on file.   Social History Main Topics  . Smoking status: Former Smoker    Quit date: 08/15/1985  . Smokeless tobacco: Never  Used   Comment: quit 20 years ago   . Alcohol Use: No  . Drug Use: No  . Sexually Active: Not on file   Other Topics Concern  . Not on file   Social History Narrative   Widowed, lives in [place], gets regular exercise by working in the yard.     Objective:  BP 128/62  Pulse 74  Temp 98.8 F (37.1 C) (Oral)  Resp 18  Wt 128 lb 12 oz (58.401 kg)  SpO2 93%  General appearance: alert, cooperative and appears stated age Ears: normal TM's and external ear canals both ears Throat: lips, mucosa, and tongue normal; teeth and gums normal Neck: no adenopathy, no carotid bruit, supple, symmetrical, trachea midline and thyroid not enlarged, symmetric, no tenderness/mass/nodules Back: symmetric, no curvature. ROM normal. No CVA tenderness. Lungs: clear to auscultation bilaterally Heart: regular rate and rhythm, S1, S2 normal, no murmur, click, rub or gallop Abdomen: soft, non-tender; bowel sounds normal; no masses,  no organomegaly Pulses: 2+ and symmetric Skin: Skin color, texture, turgor normal. No rashes or lesions Lymph nodes: Cervical, supraclavicular, and axillary nodes normal.  Assessment and Plan:  Sinusitis chronic, frontal She has no signs of persistent bacterial sinus on exam. Her symptoms appear do to persisting congestion and  allergic rhinitis. I recommended an course of prednisone to help manage the inflammation along with a decongestant antihistamine and silent simply saline for sinus lavage twice daily. If symptoms do improve we'll refer her to ENT.   Updated Medication List Outpatient Encounter Prescriptions as of 07/18/2012  Medication Sig Dispense Refill  . ALPRAZolam (XANAX) 1 MG tablet Take 0.5 tablets (0.5 mg total) by mouth at bedtime as needed for sleep.  30 tablet  5  . aspirin 81 MG EC tablet Take 81 mg by mouth daily.        . benzonatate (TESSALON) 100 MG capsule 1 or 2 capsules every 8 hours  60 capsule  0  . Cholecalciferol (VITAMIN D3) 400 UNITS  tablet Take 400 Units by mouth daily.        . enalapril (VASOTEC) 5 MG tablet Take 1 tablet (5 mg total) by mouth daily.  30 tablet  2  . fexofenadine (ALLEGRA) 180 MG tablet Take 180 mg by mouth daily.        . hydrochlorothiazide (HYDRODIURIL) 12.5 MG tablet Take 1 tablet (12.5 mg total) by mouth daily.  30 tablet  3  . levalbuterol (XOPENEX HFA) 45 MCG/ACT inhaler Inhale 1-2 puffs into the lungs every 4 (four) hours as needed for wheezing.  1 Inhaler  12  . omeprazole (PRILOSEC) 40 MG capsule Take 1 capsule (40 mg total) by mouth daily.  30 capsule  3  . sertraline (ZOLOFT) 25 MG tablet Take 1 tablet (25 mg total) by mouth daily.  30 tablet  3  . simvastatin (ZOCOR) 40 MG tablet Take 1 tablet (40 mg total) by mouth every evening.  30 tablet  3  . predniSONE (STERAPRED UNI-PAK) 10 MG tablet 6 tablets on day 1, decrease by tablet daily until gone  21 tablet  0     No orders of the defined types were placed in this encounter.    No Follow-up on file.

## 2012-08-06 ENCOUNTER — Other Ambulatory Visit: Payer: Self-pay | Admitting: *Deleted

## 2012-08-06 MED ORDER — ENALAPRIL MALEATE 5 MG PO TABS: 5.0000 mg | ORAL_TABLET | Freq: Every day | ORAL | Status: DC

## 2012-08-10 ENCOUNTER — Other Ambulatory Visit: Payer: Self-pay | Admitting: Internal Medicine

## 2012-08-10 MED ORDER — HYDROCHLOROTHIAZIDE 12.5 MG PO TABS: 12.5000 mg | ORAL_TABLET | Freq: Every day | ORAL | Status: DC

## 2012-08-16 ENCOUNTER — Ambulatory Visit (INDEPENDENT_AMBULATORY_CARE_PROVIDER_SITE_OTHER): Payer: Medicare Other | Admitting: Internal Medicine

## 2012-08-16 ENCOUNTER — Encounter: Payer: Self-pay | Admitting: Internal Medicine

## 2012-08-16 VITALS — BP 120/62 | HR 74 | Temp 98.6°F | Resp 14 | Wt 128.5 lb

## 2012-08-16 DIAGNOSIS — M549 Dorsalgia, unspecified: Secondary | ICD-10-CM

## 2012-08-16 DIAGNOSIS — J321 Chronic frontal sinusitis: Secondary | ICD-10-CM

## 2012-08-16 DIAGNOSIS — M545 Low back pain: Secondary | ICD-10-CM

## 2012-08-16 MED ORDER — METHYLPREDNISOLONE ACETATE 40 MG/ML IJ SUSP
40.0000 mg | Freq: Once | INTRAMUSCULAR | Status: AC
  Administered 2012-08-16: 40 mg via INTRAMUSCULAR

## 2012-08-16 MED ORDER — PREDNISONE (PAK) 10 MG PO TABS: ORAL_TABLET | ORAL | Status: AC

## 2012-08-16 NOTE — Patient Instructions (Addendum)
We are using a prednisone taper to help the inflammation in your back.    You may use up to 2000 mg daily of tylenol safely.  You had a shot of Depo Medrol in your arm to get a head start on your prednisone.

## 2012-08-16 NOTE — Progress Notes (Signed)
Patient ID: Caitlin Mason, female   DOB: [birth year], 77 y.o.   MRN: [id number] Patient Active Problem List  Diagnosis  . HYPERLIPIDEMIA-MIXED  . Insomnia, persistent  . Cystoid macular degeneration of right eye  . COPD (chronic obstructive pulmonary disease)  . Sinusitis chronic, frontal  . Lumbago  . Cataract    Subjective:  CC:   Chief Complaint  Patient presents with  . Back Pain    HPI:   Caitlin Mason a 77 y.o. female who presents  Past Medical History  Diagnosis Date  . Cholelithiasis     asymptomatic  . Osteoporosis   . Plantar fasciitis     Morton's Neuroma  . Actinic keratosis     back, upper extremities: s/p liquid nitro 01/29/06  . Diverticulosis   . HLD (hyperlipidemia)   . HTN (hypertension)   . Depression   . Low back pain   . 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 COPD (chronic obstructive pulmonary disease)   . Cataract     s/p extraction 2007, 2005    Past Surgical History  Procedure Date  . Colonoscopy     3/09, one sigmoid polyp; no tics   . Vesicovaginal fistula closure w/ tah   . Abdominal hysterectomy   . Nm myoview ltd Oct 2011    normal, EF 89%, study limited by lung disease         The following portions of the patient's history were reviewed and updated as appropriate: Allergies, current medications, and problem list.    Review of Systems:   12 Pt  review of systems was negative except those addressed in the HPI,     History   Social History  . Marital Status: Married    Spouse Name: N/A    Number of Children: N/A  . Years of Education: N/A   Occupational History  . Not on file.   Social History Main Topics  . Smoking status: Former Smoker    Quit date: 08/15/1985  . Smokeless tobacco: Never Used   Comment: quit 20 years ago   . Alcohol Use: No  . Drug Use: No  . Sexually Active: Not on file   Other Topics Concern  . Not on file   Social History  Narrative   Widowed, lives in [place], gets regular exercise by working in the yard.     Objective:  BP 120/62  Pulse 74  Temp 98.6 F (37 C) (Oral)  Resp 14  Wt 128 lb 8 oz (58.287 kg)  SpO2 97%  General appearance: alert, cooperative and appears stated age Ears: normal TM's and external ear canals both ears Back: symmetric, no curvature. ROM normal. No CVA tenderness. Lungs: clear to auscultation bilaterally Heart: regular rate and rhythm, S1, S2 normal, no murmur, click, rub or gallop Abdomen: soft, non-tender; bowel sounds normal; no masses,  no organomegaly Pulses: 2+ and symmetric Skin: Skin color, texture, turgor normal. No rashes or lesions Back: pt tenderness on SI joint on left.  .  Neg straight leg lift.,  DTRs and strength normal.  Lymph nodes: Cervical, supraclavicular, and axillary nodes normal.  Assessment and Plan:  Sinusitis chronic, frontal Resolved without additional use of antibiotics. Symptoms resolved with use of anti-inflammatories and decongestants.  Lumbago Her symptoms appear to be due to overuse phenomenon.  Her neurologic exam was grossly normal. She had normal strength and range of motion. She was given a shot of Depo-Medrol  and a steroid taper to help decrease inflammation over the next several days.   Updated Medication List Outpatient Encounter Prescriptions as of 08/16/2012  Medication Sig Dispense Refill  . ALPRAZolam (XANAX) 1 MG tablet Take 0.5 tablets (0.5 mg total) by mouth at bedtime as needed for sleep.  30 tablet  5  . aspirin 81 MG EC tablet Take 81 mg by mouth daily.        . Cholecalciferol (VITAMIN D3) 400 UNITS tablet Take 400 Units by mouth daily.        . enalapril (VASOTEC) 5 MG tablet Take 1 tablet (5 mg total) by mouth daily.  30 tablet  2  . fexofenadine (ALLEGRA) 180 MG tablet Take 180 mg by mouth daily.        . hydrochlorothiazide (HYDRODIURIL) 12.5 MG tablet Take 1 tablet (12.5 mg total) by mouth daily.  30 tablet  3    . omeprazole (PRILOSEC) 40 MG capsule Take 1 capsule (40 mg total) by mouth daily.  30 capsule  3  . sertraline (ZOLOFT) 25 MG tablet Take 1 tablet (25 mg total) by mouth daily.  30 tablet  3  . simvastatin (ZOCOR) 40 MG tablet Take 1 tablet (40 mg total) by mouth every evening.  30 tablet  3  . predniSONE (STERAPRED UNI-PAK) 10 MG tablet 6 tablets on Day 1 , then reduce by 1 tablet daily until gone  21 tablet  0  . DISCONTD: benzonatate (TESSALON) 100 MG capsule 1 or 2 capsules every 8 hours  60 capsule  0  . DISCONTD: levalbuterol (XOPENEX HFA) 45 MCG/ACT inhaler Inhale 1-2 puffs into the lungs every 4 (four) hours as needed for wheezing.  1 Inhaler  12  . methylPREDNISolone acetate (DEPO-MEDROL) injection 40 mg          Orders Placed This Encounter  Procedures  . HM COLONOSCOPY    No Follow-up on file.

## 2012-08-18 ENCOUNTER — Encounter: Payer: Self-pay | Admitting: Internal Medicine

## 2012-08-18 DIAGNOSIS — G8929 Other chronic pain: Secondary | ICD-10-CM | POA: Insufficient documentation

## 2012-08-18 DIAGNOSIS — Z9841 Cataract extraction status, right eye: Secondary | ICD-10-CM | POA: Insufficient documentation

## 2012-08-18 NOTE — Assessment & Plan Note (Signed)
Resolved without additional use of antibiotics. Symptoms resolved with use of anti-inflammatories and decongestants.

## 2012-08-18 NOTE — Assessment & Plan Note (Addendum)
Her symptoms appear to be due to overuse phenomenon. She was given a shot of Depo-Medrol and a steroid taper to help decrease inflammation over the next several days.

## 2012-09-07 ENCOUNTER — Encounter: Payer: Self-pay | Admitting: Internal Medicine

## 2012-09-07 ENCOUNTER — Ambulatory Visit (INDEPENDENT_AMBULATORY_CARE_PROVIDER_SITE_OTHER): Payer: Medicare Other | Admitting: Internal Medicine

## 2012-09-07 VITALS — BP 112/58 | HR 70 | Temp 98.4°F | Resp 16 | Wt 128.8 lb

## 2012-09-07 DIAGNOSIS — M25559 Pain in unspecified hip: Secondary | ICD-10-CM

## 2012-09-07 DIAGNOSIS — M5430 Sciatica, unspecified side: Secondary | ICD-10-CM

## 2012-09-07 DIAGNOSIS — M5432 Sciatica, left side: Secondary | ICD-10-CM

## 2012-09-07 MED ORDER — METHYLPREDNISOLONE ACETATE 40 MG/ML IJ SUSP
40.0000 mg | Freq: Once | INTRAMUSCULAR | Status: AC
  Administered 2012-09-07: 40 mg via INTRAMUSCULAR

## 2012-09-07 MED ORDER — PREDNISONE (PAK) 10 MG PO TABS: ORAL_TABLET | ORAL | Status: DC

## 2012-09-07 NOTE — Progress Notes (Signed)
Patient ID: Caitlin Mason, female   DOB: [birth year], 77 y.o.   MRN: [id number]  Patient Active Problem List  Diagnosis  . HYPERLIPIDEMIA-MIXED  . Insomnia, persistent  . Cystoid macular degeneration of right eye  . COPD (chronic obstructive pulmonary disease)  . Sinusitis chronic, frontal  . Lumbago  . Cataract  . Sciatica of left side    Subjective:  CC:   Chief Complaint  Patient presents with  . Hip Pain    HPI:   Caitlin Mason a 77 y.o. female who presents for Recurrence of low back pain. Patient was last seen 3 weeks ago for an acute episode of left sacro iliac pain brought on by riding her riding lawnmower for several hours the weekend prior to visit. She was treated with a steroid injection and a six-day prednisone taper with resolution of symptoms. However she has used her lawnmower again on 2 more occasions recently and has had a recurrence of pain. Patient describes low back pain now as radiating down left leg. Symptoms have been present for 4 days. Symptoms were brought on by riding a riding lawnmower for several hours over the weekend.  She has no history of foot drop or falls. She has no bowel or bladder incontinence   Past Medical History  Diagnosis Date  . Cholelithiasis     asymptomatic  . Osteoporosis   . Plantar fasciitis     Morton's Neuroma  . Actinic keratosis     back, upper extremities: s/p liquid nitro 01/29/06  . Diverticulosis   . HLD (hyperlipidemia)   . HTN (hypertension)   . Depression   . Low back pain   . 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 COPD (chronic obstructive pulmonary disease)   . Cataract     s/p extraction 2007, 2005    Past Surgical History  Procedure Date  . Colonoscopy     3/09, one sigmoid polyp; no tics   . Vesicovaginal fistula closure w/ tah   . Abdominal hysterectomy   . Nm myoview ltd Oct 2011    normal, EF 89%, study limited by lung disease         The  following portions of the patient's history were reviewed and updated as appropriate: Allergies, current medications, and problem list.    Review of Systems:   12 Pt  review of systems was negative except for low back pain radiating to left leg.    History   Social History  . Marital Status: Married    Spouse Name: N/A    Number of Children: N/A  . Years of Education: N/A   Occupational History  . Not on file.   Social History Main Topics  . Smoking status: Former Smoker    Quit date: 08/15/1985  . Smokeless tobacco: Never Used   Comment: quit 20 years ago   . Alcohol Use: No  . Drug Use: No  . Sexually Active: Not on file   Other Topics Concern  . Not on file   Social History Narrative   Widowed, lives in [place], gets regular exercise by working in the yard.     Objective:  BP 112/58  Pulse 70  Temp 98.4 F (36.9 C) (Oral)  Resp 16  Wt 128 lb 12 oz (58.401 kg)  SpO2 94%  General appearance: alert, cooperative and appears stated age Neck: no adenopathy, no carotid bruit, supple, symmetrical, trachea midline and thyroid not enlarged,  symmetric, no tenderness/mass/nodules Back: symmetric, no curvature. ROM normal. Left SI joint tenderness, left gluteal tenderness, positive straight leg lift.  Lungs: clear to auscultation bilaterally Heart: regular rate and rhythm, S1, S2 normal, no murmur, click, rub or gallop Abdomen: soft, non-tender; bowel sounds normal; no masses,  no organomegaly Pulses: 2+ and symmetric Skin: Skin color, texture, turgor normal. No rashes or lesions Lymph nodes: Cervical, supraclavicular, and axillary nodes normal.  Assessment and Plan:  Sciatica of left side This dose months episode appears to be a little bit worse than last time since she has pain radiating down her left leg to the foot. We will prescribe a 10 day prednisone taper  with the understanding that if her pain does not resolve as quickly,  she may benefit from physical  therapy.  She has worn off any more use of the lawn mowerfor the season. She is given a steroid injection and a 6 day taper of prednisone. She does not want any narcotics as she has a history of GI upset with all previously prescribed medications. She will suspend any use of Advil or Aleve and just use Tylenol for pain control recommended trial of heat and pad and ice packs   Updated Medication List Outpatient Encounter Prescriptions as of 09/07/2012  Medication Sig Dispense Refill  . ALPRAZolam (XANAX) 1 MG tablet Take 0.5 tablets (0.5 mg total) by mouth at bedtime as needed for sleep.  30 tablet  5  . aspirin 81 MG EC tablet Take 81 mg by mouth daily.        . Cholecalciferol (VITAMIN D3) 400 UNITS tablet Take 400 Units by mouth daily.        . enalapril (VASOTEC) 5 MG tablet Take 1 tablet (5 mg total) by mouth daily.  30 tablet  2  . fexofenadine (ALLEGRA) 180 MG tablet Take 180 mg by mouth daily.        . hydrochlorothiazide (HYDRODIURIL) 12.5 MG tablet Take 1 tablet (12.5 mg total) by mouth daily.  30 tablet  3  . omeprazole (PRILOSEC) 40 MG capsule Take 1 capsule (40 mg total) by mouth daily.  30 capsule  3  . sertraline (ZOLOFT) 25 MG tablet Take 1 tablet (25 mg total) by mouth daily.  30 tablet  3  . simvastatin (ZOCOR) 40 MG tablet Take 1 tablet (40 mg total) by mouth every evening.  30 tablet  3  . predniSONE (STERAPRED UNI-PAK) 10 MG tablet 6 tablets daily for 4 days,  Then decrease by 1 tablet daily until gone  39 tablet  0  . methylPREDNISolone acetate (DEPO-MEDROL) injection 40 mg          No orders of the defined types were placed in this encounter.    No Follow-up on file.

## 2012-09-07 NOTE — Patient Instructions (Addendum)
10 day prednisone taper for sciatica:  6 tablets daily for 4 days,, then decrease by 1 tablet daily until gone  Do not get your flu shot until you have been off of the prednisnone for at least a week

## 2012-09-09 ENCOUNTER — Encounter: Payer: Self-pay | Admitting: Internal Medicine

## 2012-09-09 DIAGNOSIS — M5432 Sciatica, left side: Secondary | ICD-10-CM | POA: Insufficient documentation

## 2012-09-09 NOTE — Assessment & Plan Note (Addendum)
This dose months episode appears to be a little bit worse than last time since she has pain radiating down her left leg to the foot. We will prescribe a 10 day prednisone taper  with the understanding that if her pain does not resolve as quickly,  she may benefit from physical therapy.  She has worn off any more use of the lawn mowerfor the season. She is given a steroid injection and a 6 day taper of prednisone. She does not want any narcotics as she has a history of GI upset with all previously prescribed medications. She will suspend any use of Advil or Aleve and just use Tylenol for pain control recommended trial of heat and pad and ice packs

## 2012-09-27 ENCOUNTER — Telehealth: Payer: Self-pay | Admitting: Internal Medicine

## 2012-09-27 NOTE — Telephone Encounter (Signed)
Pt is not feeling well. Pt is saying that she had an accident all over herself during the night. She feels like she is going to pass out. She has chills. She has a headache and she isn't eating well. Pt has a fever. She thinks she may have the Flu ??? She was wondering if anything could be called into [facility] drug and she says a Z-Pak does not help at all.

## 2012-09-27 NOTE — Telephone Encounter (Signed)
As I am seeing this message at 5:26pm, pt should be sent to urgent care or ED for evaluation.  Carollee Herter, This message should have been sent to triage.

## 2012-09-28 ENCOUNTER — Telehealth: Payer: Self-pay | Admitting: Internal Medicine

## 2012-09-28 NOTE — Telephone Encounter (Signed)
Patient was sent to call a nurse and there is another message from CAN.

## 2012-09-28 NOTE — Telephone Encounter (Signed)
Again, this should have been sent to triage. This patient should be seen today if possible in an urgent visit, perhaps with Dr. Lorin Picket or needs to go to urgent care. Our policy is not to call in antibiotics without evaluating.

## 2012-09-28 NOTE — Telephone Encounter (Signed)
Patient called back this morning and states she is still weak as water and not feeling well.  She states that she is now coughing up grey stuff and would like a zpak called in. I transferred the call to [facility], which the patient wasn't happy about but I advised her this was our protocol.

## 2012-09-28 NOTE — Telephone Encounter (Signed)
Caller: [name]/Patient; Patient Name: Caitlin Mason; PCP: Duncan Dull (Adults only); Best Callback Phone Number: [phone]; Reason for call: She states she became sick on Wednesday night 09/26/12.  Incontinent of Urine, nauseated and weakness. Temperature 99.0 (O).  She states ongoing "weakness", Funny feeling in your head "like a sinus infection".  Intermittent cough "grey in color". No sore throat, no sneezing. No urinary issues  since the incontinent episode.  She states she thinks it is a virus. Emergent /sx ruled out per Weakness or Paralysis Protocol with the exception -to "New onset of generalized weakness".  See ED Immediately. Patient refused office visit or ED visit. Encouraged her to call back for questions, changes or concerns and to be seen . Patient states "I think this is a virus" and you are not responsible , I will call back if I need too".  Patient expressed understanding.

## 2012-10-16 ENCOUNTER — Other Ambulatory Visit: Payer: Self-pay

## 2012-10-16 MED ORDER — SIMVASTATIN 40 MG PO TABS: 40.0000 mg | ORAL_TABLET | Freq: Every evening | ORAL | Status: DC

## 2012-10-16 NOTE — Telephone Encounter (Signed)
Simvastatin 40 mg 330 3 R sent to [facility].

## 2012-11-02 ENCOUNTER — Other Ambulatory Visit: Payer: Self-pay

## 2012-11-02 MED ORDER — OMEPRAZOLE 40 MG PO CPDR: 40.0000 mg | DELAYED_RELEASE_CAPSULE | Freq: Every day | ORAL | Status: DC

## 2012-11-02 MED ORDER — ENALAPRIL MALEATE 5 MG PO TABS: 5.0000 mg | ORAL_TABLET | Freq: Every day | ORAL | Status: DC

## 2012-11-02 NOTE — Telephone Encounter (Signed)
Refill request for Enalapril 5 mg # 30 2 R sent electronic to [facility].

## 2012-11-02 NOTE — Telephone Encounter (Signed)
Refill request for Omeprazole 40 mg # 30 3 R sent electronic to [facility].

## 2012-12-13 ENCOUNTER — Other Ambulatory Visit: Payer: Self-pay | Admitting: Internal Medicine

## 2012-12-13 MED ORDER — HYDROCHLOROTHIAZIDE 12.5 MG PO TABS: 12.5000 mg | ORAL_TABLET | Freq: Every day | ORAL | Status: DC

## 2012-12-13 NOTE — Telephone Encounter (Signed)
Med filled.  

## 2012-12-13 NOTE — Telephone Encounter (Signed)
Pt is needing refill on Hydrochlorothiazide 12.5 mg capsules QTY 30

## 2013-01-09 ENCOUNTER — Other Ambulatory Visit: Payer: Self-pay | Admitting: Internal Medicine

## 2013-01-09 DIAGNOSIS — F419 Anxiety disorder, unspecified: Secondary | ICD-10-CM

## 2013-01-09 MED ORDER — ALPRAZOLAM 1 MG PO TABS: 0.5000 mg | ORAL_TABLET | Freq: Every evening | ORAL | Status: DC | PRN

## 2013-01-09 NOTE — Telephone Encounter (Signed)
Med last filled on 04/24/12. Pt last seen on 9/25. Ok to fill?

## 2013-01-09 NOTE — Telephone Encounter (Signed)
Ok to refill,  Authorized in epic 

## 2013-01-09 NOTE — Telephone Encounter (Signed)
ALPRAZolam (XANAX) 1 MG tablet   #30

## 2013-01-10 NOTE — Telephone Encounter (Signed)
Med faxed   

## 2013-01-17 ENCOUNTER — Telehealth: Payer: Self-pay | Admitting: Internal Medicine

## 2013-01-17 DIAGNOSIS — E559 Vitamin D deficiency, unspecified: Secondary | ICD-10-CM

## 2013-01-17 DIAGNOSIS — Z79899 Other long term (current) drug therapy: Secondary | ICD-10-CM

## 2013-01-17 DIAGNOSIS — R5383 Other fatigue: Secondary | ICD-10-CM

## 2013-01-17 DIAGNOSIS — E785 Hyperlipidemia, unspecified: Secondary | ICD-10-CM

## 2013-01-17 NOTE — Telephone Encounter (Signed)
Yes, please have her come in for fasting labs. Ok to drink water before. All ordered.

## 2013-01-17 NOTE — Telephone Encounter (Signed)
Can you schedule pt for labs?

## 2013-01-17 NOTE — Telephone Encounter (Signed)
Patient wanting to know if it is time for her to have blood work done.

## 2013-01-22 NOTE — Telephone Encounter (Signed)
Scheduled

## 2013-01-24 ENCOUNTER — Other Ambulatory Visit (INDEPENDENT_AMBULATORY_CARE_PROVIDER_SITE_OTHER): Payer: Medicare Other

## 2013-01-24 DIAGNOSIS — R5383 Other fatigue: Secondary | ICD-10-CM

## 2013-01-24 DIAGNOSIS — E559 Vitamin D deficiency, unspecified: Secondary | ICD-10-CM

## 2013-01-24 DIAGNOSIS — R5381 Other malaise: Secondary | ICD-10-CM

## 2013-01-24 DIAGNOSIS — E785 Hyperlipidemia, unspecified: Secondary | ICD-10-CM

## 2013-01-24 DIAGNOSIS — Z79899 Other long term (current) drug therapy: Secondary | ICD-10-CM

## 2013-01-24 LAB — COMPREHENSIVE METABOLIC PANEL
Albumin: 3.5 g/dL (ref 3.5–5.2)
Alkaline Phosphatase: 65 U/L (ref 39–117)
Glucose, Bld: 89 mg/dL (ref 70–99)
Potassium: 4.6 mEq/L (ref 3.5–5.1)
Sodium: 138 mEq/L (ref 135–145)
Total Protein: 6.6 g/dL (ref 6.0–8.3)

## 2013-01-24 LAB — COMPLETE METABOLIC PANEL WITH GFR
ALT: 12 U/L (ref 0–35)
Alkaline Phosphatase: 67 U/L (ref 39–117)
Creat: 0.96 mg/dL (ref 0.50–1.10)
GFR, Est Non African American: 58 mL/min — ABNORMAL LOW
Glucose, Bld: 83 mg/dL (ref 70–99)
Sodium: 143 mEq/L (ref 135–145)
Total Bilirubin: 0.3 mg/dL (ref 0.3–1.2)
Total Protein: 6 g/dL (ref 6.0–8.3)

## 2013-01-24 LAB — CBC WITH DIFFERENTIAL/PLATELET
Basophils Absolute: 0 10*3/uL (ref 0.0–0.1)
Eosinophils Absolute: 0.2 10*3/uL (ref 0.0–0.7)
Eosinophils Relative: 5 % (ref 0.0–5.0)
MCHC: 33 g/dL (ref 30.0–36.0)
MCV: 87.9 fl (ref 78.0–100.0)
Monocytes Absolute: 0.4 10*3/uL (ref 0.1–1.0)
Neutrophils Relative %: 63.5 % (ref 43.0–77.0)
Platelets: 204 10*3/uL (ref 150.0–400.0)
WBC: 4.9 10*3/uL (ref 4.5–10.5)

## 2013-01-24 LAB — LIPID PANEL
HDL: 58.4 mg/dL (ref >39.00)
Triglycerides: 125 mg/dL (ref 0.0–149.0)
VLDL: 25 mg/dL (ref 0.0–40.0)

## 2013-01-25 LAB — VITAMIN D 25 HYDROXY (VIT D DEFICIENCY, FRACTURES): Vit D, 25-Hydroxy: 50 ng/mL (ref 30–89)

## 2013-02-04 ENCOUNTER — Other Ambulatory Visit: Payer: Self-pay | Admitting: *Deleted

## 2013-02-04 MED ORDER — SIMVASTATIN 40 MG PO TABS: 40.0000 mg | ORAL_TABLET | Freq: Every evening | ORAL | Status: DC

## 2013-02-04 NOTE — Telephone Encounter (Signed)
Med filled.  

## 2013-02-05 ENCOUNTER — Other Ambulatory Visit: Payer: Self-pay | Admitting: *Deleted

## 2013-02-06 MED ORDER — OMEPRAZOLE 40 MG PO CPDR: 40.0000 mg | DELAYED_RELEASE_CAPSULE | Freq: Every day | ORAL | Status: DC

## 2013-02-06 MED ORDER — ENALAPRIL MALEATE 5 MG PO TABS: 5.0000 mg | ORAL_TABLET | Freq: Every day | ORAL | Status: DC

## 2013-02-06 NOTE — Telephone Encounter (Signed)
Med filled.  

## 2013-02-08 ENCOUNTER — Other Ambulatory Visit: Payer: Self-pay | Admitting: General Practice

## 2013-02-08 MED ORDER — ENALAPRIL MALEATE 5 MG PO TABS: 5.0000 mg | ORAL_TABLET | Freq: Every day | ORAL | Status: DC

## 2013-02-08 MED ORDER — OMEPRAZOLE 40 MG PO CPDR: 40.0000 mg | DELAYED_RELEASE_CAPSULE | Freq: Every day | ORAL | Status: DC

## 2013-02-08 NOTE — Telephone Encounter (Signed)
Med filled.  

## 2013-02-20 ENCOUNTER — Ambulatory Visit: Payer: Self-pay | Admitting: General Practice

## 2013-04-15 ENCOUNTER — Telehealth: Payer: Self-pay | Admitting: Internal Medicine

## 2013-04-15 NOTE — Telephone Encounter (Signed)
Referral is in process as requested 

## 2013-04-15 NOTE — Telephone Encounter (Signed)
Please ask the patient the reason for the referral,  Is it her low back pain?  They will require an MRI of the lumbar spine prior to visit,. which I will order.

## 2013-04-15 NOTE — Telephone Encounter (Signed)
Pt called stating she needs a referral to pain clinic in [place]  [phone]

## 2013-04-15 NOTE — Telephone Encounter (Signed)
Called patient to verify reason for referral and patient stated she has had an MRI of her spine done by DR. Merri Ray at [place] [facility] and that they advised she needed her primary doctor to refer her to pain clinic.

## 2013-04-15 NOTE — Telephone Encounter (Signed)
Called patient home phone no answer and no voicemail try later.

## 2013-04-16 NOTE — Telephone Encounter (Signed)
Patient notified as requested of referral.

## 2013-05-14 ENCOUNTER — Ambulatory Visit (INDEPENDENT_AMBULATORY_CARE_PROVIDER_SITE_OTHER): Payer: Medicare Other | Admitting: Internal Medicine

## 2013-05-14 ENCOUNTER — Encounter: Payer: Self-pay | Admitting: Internal Medicine

## 2013-05-14 VITALS — BP 120/64 | HR 79 | Temp 98.2°F | Resp 14 | Ht 59.0 in | Wt 128.8 lb

## 2013-05-14 DIAGNOSIS — M5430 Sciatica, unspecified side: Secondary | ICD-10-CM

## 2013-05-14 DIAGNOSIS — M5432 Sciatica, left side: Secondary | ICD-10-CM

## 2013-05-14 DIAGNOSIS — J449 Chronic obstructive pulmonary disease, unspecified: Secondary | ICD-10-CM

## 2013-05-14 DIAGNOSIS — Z0000 Encounter for general adult medical examination without abnormal findings: Secondary | ICD-10-CM | POA: Insufficient documentation

## 2013-05-14 NOTE — Progress Notes (Signed)
Patient ID: Caitlin Mason, female   DOB: Jan 15, 1935, 78 y.o.   MRN: [id number]   The patient is here for annual Medicare wellness examination and management of other chronic and acute problems.   The risk factors are reflected in the social history.  The roster of all physicians providing medical care to patient - is listed in the Snapshot section of the chart.  Activities of daily living:  The patient is 100% independent in all ADLs: dressing, toileting, feeding as well as independent mobility  Home safety : The patient has smoke detectors in the home. They wear seatbelts.  There are no firearms at home. There is no violence in the home.   There is no risks for hepatitis, STDs or HIV. There is no   history of blood transfusion. They have no travel history to infectious disease endemic areas of the world.  She does not feel well due to persistent left hip and lower back pain which has not improved with conservative measures.  She was told that her pain was due to a bulging disk by MRI.  She refused 2nd shot. Wants to see Aliene Beams bc two friends have been pleased. Pain radiates to left knee and below, and her left foot is developing numbness.  Frustrated   She is losing vision in right eye due to [facility] degeneration  She has had a sore throat for the last 12 hours .  She is having some post nasal drip  The patient has seen their dentist in the last six month. They have seen their eye doctor in the last year. They admit to slight hearing difficulty with regard to whispered voices and some television programs.  They have deferred audiologic testing in the last year.  They do not  have excessive sun exposure. Discussed the need for sun protection: hats, long sleeves and use of sunscreen if there is significant sun exposure.   Diet: the importance of a healthy diet is discussed. They do have a healthy diet.  The benefits of regular aerobic exercise were discussed. She walks 4 times per week  ,  20 minutes.   Depression screen: there are no signs or vegative symptoms of depression- irritability, change in appetite, anhedonia, sadness/tearfullness.  Cognitive assessment: the patient manages all their financial and personal affairs and is actively engaged. They could relate day,date,year and events; recalled 2/3 objects at 3 minutes; performed clock-face test normally.  The following portions of the patient's history were reviewed and updated as appropriate: allergies, current medications, past family history, past medical history,  past surgical history, past social history  and problem list.  Visual acuity was not assessed per patient preference since she has regular follow up with her ophthalmologist. Hearing and body mass index were assessed and reviewed.   During the course of the visit the patient was educated and counseled about appropriate screening and preventive services including : fall prevention , diabetes screening, nutrition counseling, colorectal cancer screening, and recommended immunizations.    Objective:  BP 120/64  Pulse 79  Temp(Src) 98.2 F (36.8 C) (Oral)  Resp 14  Ht 4\' 11"  (1.499 m)  Wt 128 lb 12 oz (58.401 kg)  BMI 25.99 kg/m2  SpO2 96%  General Appearance:    Alert, cooperative, no distress, appears stated age  Head:    Normocephalic, without obvious abnormality, atraumatic  Eyes:    PERRL, conjunctiva/corneas clear, EOM's intact, fundi    benign, both eyes  Ears:    Normal TM's  and external ear canals, both ears  Nose:   Nares normal, septum midline, mucosa normal, no drainage    or sinus tenderness  Throat:   Lips, mucosa, and tongue normal; teeth and gums normal  Neck:   Supple, symmetrical, trachea midline, no adenopathy;    thyroid:  no enlargement/tenderness/nodules; no carotid   bruit or JVD  Back:     Symmetric, no curvature, ROM normal, no CVA tenderness  Lungs:     Clear to auscultation bilaterally, respirations unlabored  Chest  Wall:    No tenderness or deformity   Heart:    Regular rate and rhythm, S1 and S2 normal, no murmur, rub   or gallop  Breast Exam:    No tenderness, masses, or nipple abnormality  Abdomen:     Soft, non-tender, bowel sounds active all four quadrants,    no masses, no organomegaly  Genitalia:    Pelvic:  no adnexal masses or tenderness, rectovaginal septum normal,  vagina normal without discharge  Extremities:   Extremities normal, atraumatic, no cyanosis or edema  Pulses:   2+ and symmetric all extremities  Skin:   Skin color, texture, turgor normal, no rashes or lesions  Lymph nodes:   Cervical, supraclavicular, and axillary nodes normal  Neurologic:   CNII-XII intact, normal strength, sensation and reflexes    throughout    Assessment and Plan:  Sciatica of left side Persistent, without relief rom an epidural injection, with foot numbness. Referral to [facility] for surgical evaluation   COPD (chronic obstructive pulmonary disease) Currently asymptomatic   Routine general medical examination at a health care facility Annual comprehensive exam was done including breast, pelvic exam were done. All screenings have been addressed .    Updated Medication List Outpatient Encounter Prescriptions as of 05/14/2013  Medication Sig Dispense Refill  . ALPRAZolam (XANAX) 1 MG tablet Take 0.5 tablets (0.5 mg total) by mouth at bedtime as needed for sleep.  30 tablet  5  . aspirin 81 MG EC tablet Take 81 mg by mouth daily.        . Cholecalciferol (VITAMIN D3) 400 UNITS tablet Take 400 Units by mouth daily.        . enalapril (VASOTEC) 5 MG tablet Take 1 tablet (5 mg total) by mouth daily.  30 tablet  2  . fexofenadine (ALLEGRA) 180 MG tablet Take 180 mg by mouth daily.        . hydrochlorothiazide (HYDRODIURIL) 12.5 MG tablet Take 1 tablet (12.5 mg total) by mouth daily.  30 tablet  3  . omeprazole (PRILOSEC) 40 MG capsule Take 1 capsule (40 mg total) by mouth daily.  30 capsule  3  .  simvastatin (ZOCOR) 40 MG tablet Take 1 tablet (40 mg total) by mouth every evening.  30 tablet  3  . predniSONE (STERAPRED UNI-PAK) 10 MG tablet 6 tablets daily for 4 days,  Then decrease by 1 tablet daily until gone  39 tablet  0   No facility-administered encounter medications on file as of 05/14/2013.

## 2013-05-14 NOTE — Addendum Note (Signed)
Addended by: Sherlene Shams on: 05/14/2013 10:32 PM   Modules accepted: Orders

## 2013-05-14 NOTE — Assessment & Plan Note (Signed)
Annual comprehensive exam was done including breast, pelvic exam were done. All screenings have been addressed .

## 2013-05-14 NOTE — Assessment & Plan Note (Signed)
Persistent, without relief rom an epidural injection, with foot numbness. Referral to [facility] for surgical evaluation

## 2013-05-14 NOTE — Patient Instructions (Addendum)
Your breast and pelvic exam were both normal  We will repeat your mammogram in 2015  I will make the referral to Dr. Gerlene Fee for your back pain to consider surgery.    Return in August for labs (fasting)   For your throat,  Flush sinuses twice daily with Simply Saline , and continue your allegra.  Gargle with salt water often  If no improvement by thursday let me know

## 2013-05-14 NOTE — Assessment & Plan Note (Signed)
Currently asymptomatic 

## 2013-05-15 ENCOUNTER — Telehealth: Payer: Self-pay | Admitting: Internal Medicine

## 2013-05-15 MED ORDER — LEVOFLOXACIN 500 MG PO TABS: 500.0000 mg | ORAL_TABLET | Freq: Every day | ORAL | Status: DC

## 2013-05-15 NOTE — Telephone Encounter (Signed)
Patient is not better . She has yellow and green mucous coming up and terrible cough.  Needing and antibiotic called into the pharmacy . She does not want a Z-pac.

## 2013-05-15 NOTE — Telephone Encounter (Signed)
Once daily levaquin sent to pharmacy in [place].  Her insurance wont' cover any of the medications i usually prescribe for cough.

## 2013-05-15 NOTE — Telephone Encounter (Signed)
Patient stated she has already picked up Antibiotic and taken first dose. Patient reports temp 99.0 and cough, patient gives her thanks and wishes to try the antibiotic for a couple of days but if no improvement will call office.

## 2013-05-22 ENCOUNTER — Telehealth: Payer: Self-pay | Admitting: Internal Medicine

## 2013-05-22 MED ORDER — PREDNISONE (PAK) 10 MG PO TABS: ORAL_TABLET | ORAL | Status: DC

## 2013-05-22 NOTE — Telephone Encounter (Signed)
,  prednisone rx sent

## 2013-05-22 NOTE — Telephone Encounter (Signed)
The patient called stating she can't take levofloxacin it has made her nauseated and she has been vomiting and sweating. She wants this medication put on the list of medication she can't take . The patient also stated she has a cough and congestion but not any infection . She told the pharmacist her symptoms and asked him if prednisone would help and he stated yes. Therefore the patient is requesting prednisone for her cough and congestion.

## 2013-05-23 ENCOUNTER — Other Ambulatory Visit (HOSPITAL_COMMUNITY): Payer: Medicare Other

## 2013-05-23 NOTE — Telephone Encounter (Signed)
Pt notified that Rx was sent to pharmacy and to call back with failure of improvement. Levaquin updated on allergy list.

## 2013-06-04 ENCOUNTER — Encounter (HOSPITAL_COMMUNITY): Admission: RE | Payer: Self-pay | Source: Ambulatory Visit

## 2013-06-04 ENCOUNTER — Inpatient Hospital Stay (HOSPITAL_COMMUNITY): Admission: RE | Admit: 2013-06-04 | Payer: Medicare Other | Source: Ambulatory Visit | Admitting: Neurosurgery

## 2013-06-04 SURGERY — POSTERIOR LUMBAR FUSION 1 LEVEL
Anesthesia: General | Site: Back

## 2013-07-02 ENCOUNTER — Other Ambulatory Visit: Payer: Self-pay | Admitting: *Deleted

## 2013-07-02 MED ORDER — ENALAPRIL MALEATE 5 MG PO TABS: 5.0000 mg | ORAL_TABLET | Freq: Every day | ORAL | Status: DC

## 2013-07-02 MED ORDER — OMEPRAZOLE 40 MG PO CPDR: 40.0000 mg | DELAYED_RELEASE_CAPSULE | Freq: Every day | ORAL | Status: DC

## 2013-07-29 ENCOUNTER — Ambulatory Visit (INDEPENDENT_AMBULATORY_CARE_PROVIDER_SITE_OTHER): Payer: Medicare Other | Admitting: Adult Health

## 2013-07-29 ENCOUNTER — Encounter: Payer: Self-pay | Admitting: Adult Health

## 2013-07-29 VITALS — BP 100/56 | HR 84 | Temp 98.0°F | Resp 12 | Wt 127.0 lb

## 2013-07-29 DIAGNOSIS — R1032 Left lower quadrant pain: Secondary | ICD-10-CM

## 2013-07-29 DIAGNOSIS — R350 Frequency of micturition: Secondary | ICD-10-CM

## 2013-07-29 LAB — POCT URINALYSIS DIPSTICK
Glucose, UA: NEGATIVE
Spec Grav, UA: >=1.03
Urobilinogen, UA: 0.2

## 2013-07-29 MED ORDER — METRONIDAZOLE 500 MG PO TABS: 500.0000 mg | ORAL_TABLET | Freq: Three times a day (TID) | ORAL | Status: DC

## 2013-07-29 MED ORDER — CIPROFLOXACIN HCL 500 MG PO TABS: 500.0000 mg | ORAL_TABLET | Freq: Two times a day (BID) | ORAL | Status: DC

## 2013-07-29 NOTE — Progress Notes (Signed)
  Subjective:    Patient ID: Caitlin Mason, female    DOB: 1935-06-10, 78 y.o.   MRN: [id number]  HPI  Patient presents with LLQ discomfort which started on Saturday. She reports having hx of diverticulitis. Patient reports that she has been eating lots of tomatoes.  She is scheduled for colonoscopy on September 3.  Review of Systems  Gastrointestinal: Positive for abdominal pain. Negative for nausea, vomiting, diarrhea and blood in stool.       Left lower quadrant discomfort    BP 100/56  Pulse 84  Temp(Src) 98 F (36.7 C) (Oral)  Resp 12  Wt 127 lb (57.607 kg)  BMI 25.64 kg/m2  SpO2 96%     Objective:   Physical Exam  Constitutional: She is oriented to person, place, and time. She appears well-developed and well-nourished. No distress.  Cardiovascular: Normal rate and regular rhythm.   Pulmonary/Chest: Effort normal and breath sounds normal.  Abdominal: Soft. Bowel sounds are normal. She exhibits no distension and no mass. There is tenderness. There is no rebound and no guarding.  Neurological: She is oriented to person, place, and time.  Psychiatric: She has a normal mood and affect. Her behavior is normal. Thought content normal.       Assessment & Plan:

## 2013-07-29 NOTE — Assessment & Plan Note (Signed)
Suspect diverticulitis. Start Cipro 500 mg bid x 7 days and Metronidazole 500 mg tid x 7 days. RTC if no improvement within 3-4 days or if symptoms worsen.

## 2013-07-31 ENCOUNTER — Other Ambulatory Visit: Payer: Self-pay | Admitting: *Deleted

## 2013-07-31 MED ORDER — SIMVASTATIN 40 MG PO TABS: 40.0000 mg | ORAL_TABLET | Freq: Every evening | ORAL | Status: DC

## 2013-08-21 ENCOUNTER — Ambulatory Visit: Payer: Self-pay | Admitting: Gastroenterology

## 2013-08-21 LAB — HM COLONOSCOPY

## 2013-08-22 ENCOUNTER — Other Ambulatory Visit: Payer: Self-pay | Admitting: *Deleted

## 2013-08-22 DIAGNOSIS — F419 Anxiety disorder, unspecified: Secondary | ICD-10-CM

## 2013-08-23 ENCOUNTER — Other Ambulatory Visit: Payer: Self-pay | Admitting: Internal Medicine

## 2013-08-23 DIAGNOSIS — F419 Anxiety disorder, unspecified: Secondary | ICD-10-CM

## 2013-08-23 MED ORDER — ALPRAZOLAM 1 MG PO TABS: 0.5000 mg | ORAL_TABLET | Freq: Every evening | ORAL | Status: DC | PRN

## 2013-08-23 NOTE — Telephone Encounter (Signed)
Patient calling in today saying she is completely out of this medication. She states that [facility] has faxed [place] 2 times for this medica ition. Please advise when it has been called in.

## 2013-08-27 ENCOUNTER — Encounter: Payer: Self-pay | Admitting: Internal Medicine

## 2013-09-09 ENCOUNTER — Encounter: Payer: Self-pay | Admitting: Internal Medicine

## 2013-10-16 ENCOUNTER — Other Ambulatory Visit (INDEPENDENT_AMBULATORY_CARE_PROVIDER_SITE_OTHER): Payer: Medicare Other

## 2013-10-16 ENCOUNTER — Telehealth: Payer: Self-pay | Admitting: *Deleted

## 2013-10-16 DIAGNOSIS — E785 Hyperlipidemia, unspecified: Secondary | ICD-10-CM

## 2013-10-16 DIAGNOSIS — Z79899 Other long term (current) drug therapy: Secondary | ICD-10-CM

## 2013-10-16 LAB — COMPREHENSIVE METABOLIC PANEL
ALT: 9 U/L (ref 0–35)
Alkaline Phosphatase: 60 U/L (ref 39–117)
BUN: 19 mg/dL (ref 6–23)
Calcium: 9.6 mg/dL (ref 8.4–10.5)
Chloride: 100 mEq/L (ref 96–112)
Glucose, Bld: 90 mg/dL (ref 70–99)
Potassium: 4.7 mEq/L (ref 3.5–5.1)
Sodium: 139 mEq/L (ref 135–145)
Total Bilirubin: 0.5 mg/dL (ref 0.3–1.2)

## 2013-10-16 LAB — LIPID PANEL
Cholesterol: 209 mg/dL — ABNORMAL HIGH (ref 0–200)
HDL: 58 mg/dL (ref >39.00)
Triglycerides: 181 mg/dL — ABNORMAL HIGH (ref 0.0–149.0)
VLDL: 36.2 mg/dL (ref 0.0–40.0)

## 2013-10-16 LAB — LDL CHOLESTEROL, DIRECT: Direct LDL: 131.2 mg/dL

## 2013-10-16 NOTE — Telephone Encounter (Signed)
What labs and dx?  

## 2013-10-18 ENCOUNTER — Encounter: Payer: Self-pay | Admitting: *Deleted

## 2013-11-06 ENCOUNTER — Ambulatory Visit (INDEPENDENT_AMBULATORY_CARE_PROVIDER_SITE_OTHER): Payer: Medicare Other | Admitting: Internal Medicine

## 2013-11-06 ENCOUNTER — Encounter: Payer: Self-pay | Admitting: Internal Medicine

## 2013-11-06 VITALS — BP 118/60 | HR 85 | Temp 98.1°F | Resp 14 | Ht 59.0 in | Wt 130.0 lb

## 2013-11-06 DIAGNOSIS — Z23 Encounter for immunization: Secondary | ICD-10-CM

## 2013-11-06 DIAGNOSIS — J449 Chronic obstructive pulmonary disease, unspecified: Secondary | ICD-10-CM

## 2013-11-06 DIAGNOSIS — J069 Acute upper respiratory infection, unspecified: Secondary | ICD-10-CM

## 2013-11-06 MED ORDER — GUAIFENESIN-CODEINE 100-10 MG/5ML PO SYRP: 5.0000 mL | ORAL_SOLUTION | Freq: Three times a day (TID) | ORAL | Status: DC | PRN

## 2013-11-06 MED ORDER — AMOXICILLIN-POT CLAVULANATE 875-125 MG PO TABS: 1.0000 | ORAL_TABLET | Freq: Two times a day (BID) | ORAL | Status: DC

## 2013-11-06 NOTE — Patient Instructions (Addendum)
You have a viral  Syndrome .  The post nasal drip is causing your sore throat.  Lavage your sinuses twice daily with Simply saline nasal spray.  You can use benadryl 25 mg every 8 hours and Sudafed PE 10 to 30 every 8 hours to manage the drainage and congestion.  Continue to gargle with salt water often for the sore throat.  Use Tussin for the cough .  If the throat is no better  In 3 to 4 days OR  if you develop Temp > 100.4,   facial pain,  Or bronchitis you can start the augmentin   BUT:  Please take a probiotic ( Align, Floraque or Culturelle) Every day for 2 weeks if you start the antibiotic to prevent a serious antibiotic associated diarrhea  Called clostirudium dificile colitis and a vaginal yeast infection

## 2013-11-06 NOTE — Progress Notes (Signed)
Pre-visit discussion using our clinic review tool. No additional management support is needed unless otherwise documented below in the visit note.  

## 2013-11-06 NOTE — Progress Notes (Signed)
Patient ID: Caitlin Mason, female   DOB: [birth year], 78 y.o.   MRN: [id number]   Patient Active Problem List   Diagnosis Date Noted  . Acute URI 11/08/2013  . LLQ pain 07/29/2013  . Routine general medical examination at a health care facility 05/14/2013  . Sciatica of left side 09/09/2012  . Lumbago 08/18/2012  . Cataract   . COPD (chronic obstructive pulmonary disease)   . Cystoid macular degeneration of right eye   . Insomnia, persistent 08/16/2011  . HYPERLIPIDEMIA-MIXED 09/30/2010    Subjective:  CC:   Chief Complaint  Patient presents with  . Cough    Productive green and yellow mucus, feels weak and fatigued.    HPI:   Caitlin Mason a 78 y.o. female who presents with bronchitis symptoms since Monday  No fever,  Head feels stuffed up,.  Throat sore,  Having a Productive cough yellow /green.  Had flu shot .  Wanting antibiotics.     Past Medical History  Diagnosis Date  . Cholelithiasis     asymptomatic  . Osteoporosis   . Plantar fasciitis     Morton's Neuroma  . Actinic keratosis     back, upper extremities: s/p liquid nitro 01/29/06  . Diverticulosis   . HLD (hyperlipidemia)   . HTN (hypertension)   . Depression   . Low back pain   . 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 COPD (chronic obstructive pulmonary disease)   . Cataract     s/p extraction 2007, 2005    Past Surgical History  Procedure Laterality Date  . Colonoscopy      3/09, one sigmoid polyp; no tics   . Vesicovaginal fistula closure w/ tah    . Abdominal hysterectomy    . Nm myoview ltd  Oct 2011    normal, EF 89%, study limited by lung disease       The following portions of the patient's history were reviewed and updated as appropriate: Allergies, current medications, and problem list.    Review of Systems:   12 Pt  review of systems was negative except those addressed in the HPI,     History   Social History  . Marital  Status: Widowed    Spouse Name: N/A    Number of Children: N/A  . Years of Education: N/A   Occupational History  . Not on file.   Social History Main Topics  . Smoking status: Former Smoker    Quit date: 08/15/1985  . Smokeless tobacco: Never Used     Comment: quit 20 years ago   . Alcohol Use: No  . Drug Use: No  . Sexual Activity: Not on file   Other Topics Concern  . Not on file   Social History Narrative   Widowed, lives in [place], gets regular exercise by working in the yard.     Objective:  Filed Vitals:   11/06/13 1120  BP: 118/60  Pulse: 85  Temp: 98.1 F (36.7 C)  Resp: 14     General appearance: alert, cooperative and appears stated age Ears: normal TM's and external ear canals both ears Throat: lips, mucosa, and tongue normal; teeth and gums normal Neck: no adenopathy, no carotid bruit, supple, symmetrical, trachea midline and thyroid not enlarged, symmetric, no tenderness/mass/nodules Back: symmetric, no curvature. ROM normal. No CVA tenderness. Lungs: clear to auscultation bilaterally Heart: regular rate and rhythm, S1, S2 normal, no murmur, click, rub or  gallop Abdomen: soft, non-tender; bowel sounds normal; no masses,  no organomegaly Pulses: 2+ and symmetric Skin: Skin color, texture, turgor normal. No rashes or lesions Lymph nodes: Cervical, supraclavicular, and axillary nodes normal.  Assessment and Plan:  Acute URI Explained that her currnet symptoms of  URI are caused by viral infection currently given her current symptoms.   I have explained that in viral URIS, an antibiotic will not help the symptoms and will increase the risk of developing diarrhea. Advised to use oral and nasal decongestants,  Ibuprofen 400 mg and tylenol 650 mq 8 hrs for aches and pains,  tessalon every 8 hours prn cough  Advised to start round of abx only if symptoms worsen to include fevers, facial pain, purulent sputum./drainage.    Updated Medication  List Outpatient Encounter Prescriptions as of 11/06/2013  Medication Sig  . ALPRAZolam (XANAX) 1 MG tablet Take 0.5 tablets (0.5 mg total) by mouth at bedtime as needed for sleep.  Marland Kitchen aspirin 81 MG EC tablet Take 81 mg by mouth daily.    . Cholecalciferol (VITAMIN D3) 400 UNITS tablet Take 1,000 Units by mouth daily.   . enalapril (VASOTEC) 5 MG tablet Take 1 tablet (5 mg total) by mouth daily.  . fexofenadine (ALLEGRA) 180 MG tablet Take 180 mg by mouth daily.    . hydrochlorothiazide (HYDRODIURIL) 12.5 MG tablet Take 1 tablet (12.5 mg total) by mouth daily.  Marland Kitchen omeprazole (PRILOSEC) 40 MG capsule Take 1 capsule (40 mg total) by mouth daily.  . simvastatin (ZOCOR) 40 MG tablet Take 1 tablet (40 mg total) by mouth every evening.  Marland Kitchen amoxicillin-clavulanate (AUGMENTIN) 875-125 MG per tablet Take 1 tablet by mouth 2 (two) times daily.  . [DISCONTINUED] ciprofloxacin (CIPRO) 500 MG tablet Take 1 tablet (500 mg total) by mouth 2 (two) times daily.  . [DISCONTINUED] guaiFENesin-codeine (CHERATUSSIN AC) 100-10 MG/5ML syrup Take 5 mLs by mouth 3 (three) times daily as needed for cough.  . [DISCONTINUED] metroNIDAZOLE (FLAGYL) 500 MG tablet Take 1 tablet (500 mg total) by mouth 3 (three) times daily.     Orders Placed This Encounter  Procedures  . Pneumococcal polysaccharide vaccine 23-valent greater than or equal to 2yo subcutaneous/IM    No Follow-up on file.

## 2013-11-08 ENCOUNTER — Encounter: Payer: Self-pay | Admitting: Internal Medicine

## 2013-11-08 DIAGNOSIS — J069 Acute upper respiratory infection, unspecified: Secondary | ICD-10-CM | POA: Insufficient documentation

## 2013-11-08 NOTE — Assessment & Plan Note (Signed)
Explained that her currnet symptoms of  URI are caused by viral infection currently given her current symptoms.   I have explained that in viral URIS, an antibiotic will not help the symptoms and will increase the risk of developing diarrhea. Advised to use oral and nasal decongestants,  Ibuprofen 400 mg and tylenol 650 mq 8 hrs for aches and pains,  tessalon every 8 hours prn cough  Advised to start round of abx only if symptoms worsen to include fevers, facial pain, purulent sputum./drainage.

## 2013-12-30 ENCOUNTER — Other Ambulatory Visit: Payer: Self-pay | Admitting: *Deleted

## 2013-12-30 MED ORDER — OMEPRAZOLE 40 MG PO CPDR: 40.0000 mg | DELAYED_RELEASE_CAPSULE | Freq: Every day | ORAL | Status: DC

## 2013-12-30 MED ORDER — ENALAPRIL MALEATE 5 MG PO TABS: 5.0000 mg | ORAL_TABLET | Freq: Every day | ORAL | Status: DC

## 2013-12-31 ENCOUNTER — Ambulatory Visit: Payer: Self-pay | Admitting: Internal Medicine

## 2014-01-06 ENCOUNTER — Other Ambulatory Visit: Payer: Self-pay | Admitting: *Deleted

## 2014-01-06 MED ORDER — HYDROCHLOROTHIAZIDE 12.5 MG PO TABS: 12.5000 mg | ORAL_TABLET | Freq: Every day | ORAL | Status: DC

## 2014-01-16 LAB — HM MAMMOGRAPHY: HM Mammogram: NORMAL

## 2014-01-22 ENCOUNTER — Telehealth: Payer: Self-pay | Admitting: *Deleted

## 2014-01-22 ENCOUNTER — Other Ambulatory Visit: Payer: Medicare Other

## 2014-01-22 NOTE — Telephone Encounter (Signed)
None, she is not due until end of April (per last e mail in October)

## 2014-01-22 NOTE — Telephone Encounter (Signed)
What labs and dx?  

## 2014-01-27 ENCOUNTER — Encounter: Payer: Self-pay | Admitting: Internal Medicine

## 2014-02-07 ENCOUNTER — Telehealth: Payer: Self-pay | Admitting: *Deleted

## 2014-02-07 NOTE — Telephone Encounter (Signed)
Patient called wanting results for blood drawn on 01/22/14 advised patient no labs were run due to insurance will not pay.

## 2014-03-06 ENCOUNTER — Other Ambulatory Visit: Payer: Self-pay | Admitting: *Deleted

## 2014-03-06 MED ORDER — SIMVASTATIN 40 MG PO TABS
40.0000 mg | ORAL_TABLET | Freq: Every evening | ORAL | Status: DC
Start: 2014-03-06 — End: 2014-10-15

## 2014-03-07 ENCOUNTER — Other Ambulatory Visit: Payer: Self-pay | Admitting: *Deleted

## 2014-03-07 DIAGNOSIS — F419 Anxiety disorder, unspecified: Secondary | ICD-10-CM

## 2014-03-07 MED ORDER — ALPRAZOLAM 0.5 MG PO TABS: 0.5000 mg | ORAL_TABLET | Freq: Every evening | ORAL | Status: DC | PRN

## 2014-03-07 NOTE — Telephone Encounter (Signed)
Last visit 11/06/13, ok refill?

## 2014-03-07 NOTE — Telephone Encounter (Signed)
Ok to refill,  printed rx .  rx changed to 0.5 mg tablet since that is her dose

## 2014-03-28 ENCOUNTER — Telehealth: Payer: Self-pay | Admitting: Internal Medicine

## 2014-03-28 MED ORDER — CIPROFLOXACIN HCL 250 MG PO TABS: 250.0000 mg | ORAL_TABLET | Freq: Two times a day (BID) | ORAL | Status: DC

## 2014-03-28 MED ORDER — METRONIDAZOLE 500 MG PO TABS: 500.0000 mg | ORAL_TABLET | Freq: Three times a day (TID) | ORAL | Status: DC

## 2014-03-28 NOTE — Telephone Encounter (Signed)
cipro and flagyl sent to pharmacy.  Clear liquid diet over the weekedn.  Give appt early next week with me or raquel

## 2014-03-28 NOTE — Telephone Encounter (Signed)
Patient notified appointment scheduled.

## 2014-03-28 NOTE — Telephone Encounter (Signed)
Patient started having pain in the bottom of her Abdomen, patient having diarrhea, and she fill this what she is having again. Patient stated she cannot go any where else but no appointment available.

## 2014-03-28 NOTE — Telephone Encounter (Signed)
Pt states she has called and unable to get appt with [place] and does not know where [facility] is.  Pt wants [place] to call in medication for diverticulitis.  States she has seen Dr. Derrel Nip and Raquel for this and she knows it is the same thing.  States Dr. Derrel Nip has also prescribed medication for her diverticulitis.  Patient has been transferred to triage.  Triage transferred pt back to office.  Pt would like a call to let her know either way, whether she can receive her medication or not she states she needs to know.  She uses [facility] [phone].

## 2014-03-31 ENCOUNTER — Encounter (INDEPENDENT_AMBULATORY_CARE_PROVIDER_SITE_OTHER): Payer: Self-pay

## 2014-03-31 ENCOUNTER — Encounter: Payer: Self-pay | Admitting: Internal Medicine

## 2014-03-31 ENCOUNTER — Ambulatory Visit (INDEPENDENT_AMBULATORY_CARE_PROVIDER_SITE_OTHER): Payer: Medicare Other | Admitting: Internal Medicine

## 2014-03-31 VITALS — BP 112/60 | HR 96 | Temp 98.2°F | Resp 16 | Wt 126.8 lb

## 2014-03-31 DIAGNOSIS — K5732 Diverticulitis of large intestine without perforation or abscess without bleeding: Secondary | ICD-10-CM

## 2014-03-31 NOTE — Patient Instructions (Signed)
I am glad you are feeling better   Please take a probiotic ( Align, Floraque or Culturelle) while you are on the antibiotic to prevent a serious antibiotic associated diarrhea  Called clostridium dificile colitis and a vaginal yeast infection   Try to take 2 Flagyl daily   You need to drink  more water  To make your urine lighter color and fix your dehydration

## 2014-03-31 NOTE — Progress Notes (Signed)
Pre-visit discussion using our clinic review tool. No additional management support is needed unless otherwise documented below in the visit note.  

## 2014-03-31 NOTE — Progress Notes (Signed)
Patient ID: Caitlin Mason, female   DOB: [birth year], 79 y.o.   MRN: [id number] Patient Active Problem List   Diagnosis Date Noted  . Diverticulitis of colon (without mention of hemorrhage) 04/01/2014  . Acute URI 11/08/2013  . LLQ pain 07/29/2013  . Routine general medical examination at a health care facility 05/14/2013  . Sciatica of left side 09/09/2012  . Lumbago 08/18/2012  . Cataract   . COPD (chronic obstructive pulmonary disease)   . Cystoid macular degeneration of right eye   . Insomnia, persistent 08/16/2011  . HYPERLIPIDEMIA-MIXED 09/30/2010    Subjective:  CC:   Chief Complaint  Patient presents with  . Follow-up    HPI:   Caitlin Mason is a 79 y.o. female who presents for follow up on abdominal pain presumed to be secondary to diverticulitis.  Patient was  started on Cipro/gGlagyl empirically on Friday with improvement in symptoms but not tolerating the flagyl  Symptoms started 3 am Friday morning in the RLQ quadrant , then  Began to involve the LLQ and suprpubic atrea,  acc'd by 7 or 8 loose stools and anorexia.  The loose stools have now resolved and she is tolerating a prudent diet..  NO fevers or chills,    Thinks the attack was triggered by eating a snickers candy bar Friday night    Past Medical History  Diagnosis Date  . Cholelithiasis     asymptomatic  . Osteoporosis   . Plantar fasciitis     Morton's Neuroma  . Actinic keratosis     back, upper extremities: s/p liquid nitro 01/29/06  . Diverticulosis   . HLD (hyperlipidemia)   . HTN (hypertension)   . Depression   . Low back pain   . 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 COPD (chronic obstructive pulmonary disease)   . Cataract     s/p extraction 2007, 2005    Past Surgical History  Procedure Laterality Date  . Colonoscopy      2014,  Diverticulosis ,  polyps   . Vesicovaginal fistula closure w/ tah    . Abdominal hysterectomy    . Nm  myoview ltd  Oct 2011    normal, EF 89%, study limited by lung disease       The following portions of the patient's history were reviewed and updated as appropriate: Allergies, current medications, and problem list.    Review of Systems:   Patient denies headache, fevers, malaise, unintentional weight loss, skin rash, eye pain, sinus congestion and sinus pain, sore throat, dysphagia,  hemoptysis , cough, dyspnea, wheezing, chest pain, palpitations, orthopnea, edema, abdominal pain, nausea, melena, diarrhea, constipation, flank pain, dysuria, hematuria, urinary  Frequency, nocturia, numbness, tingling, seizures,  Focal weakness, Loss of consciousness,  Tremor, insomnia, depression, anxiety, and suicidal ideation.     History   Social History  . Marital Status: Widowed    Spouse Name: N/A    Number of Children: N/A  . Years of Education: N/A   Occupational History  . Not on file.   Social History Main Topics  . Smoking status: Former Smoker    Quit date: 08/15/1985  . Smokeless tobacco: Never Used     Comment: quit 20 years ago   . Alcohol Use: No  . Drug Use: No  . Sexual Activity: Not on file   Other Topics Concern  . Not on file   Social History Narrative   Widowed, lives in  [place], gets regular exercise by working in the yard.     Objective:  Filed Vitals:   03/31/14 1746  BP: 112/60  Pulse: 96  Temp: 98.2 F (36.8 C)  Resp: 16     General appearance: alert, cooperative and appears stated age  Back: symmetric, no curvature. ROM normal. No CVA tenderness. Lungs: clear to auscultation bilaterally Heart: regular rate and rhythm, S1, S2 normal, no murmur, click, rub or gallop Abdomen: soft, mild tenderness without guarding in bilateral lower quadrants,  ; bowel sounds normal; no masses,  no organomegaly Pulses: 2+ and symmetric Skin: Skin color, texture, turgor normal. No rashes or lesions Lymph nodes: Cervical, supraclavicular, and axillary nodes  normal. Psych: normal affect, makes good eye contact.   Assessment and Plan:  Diverticulitis of colon (without mention of hemorrhage) Presumed by history ,  Has diverticulosis by colonoscopy in 2014 .  Improved symtoms and diarrhea resolved.  She was advised  to continue the flaygl since the sympotns are "nerves,"  Not GI intolerance or rash.    Updated Medication List Outpatient Encounter Prescriptions as of 03/31/2014  Medication Sig  . ALPRAZolam (XANAX) 0.5 MG tablet Take 1 tablet (0.5 mg total) by mouth at bedtime as needed for sleep.  Marland Kitchen aspirin 81 MG EC tablet Take 81 mg by mouth daily.    . Cholecalciferol (VITAMIN D3) 400 UNITS tablet Take 1,000 Units by mouth daily.   . ciprofloxacin (CIPRO) 250 MG tablet Take 1 tablet (250 mg total) by mouth 2 (two) times daily.  . enalapril (VASOTEC) 5 MG tablet Take 1 tablet (5 mg total) by mouth daily.  . fexofenadine (ALLEGRA) 180 MG tablet Take 180 mg by mouth daily.    . hydrochlorothiazide (HYDRODIURIL) 12.5 MG tablet Take 1 tablet (12.5 mg total) by mouth daily.  . metroNIDAZOLE (FLAGYL) 500 MG tablet Take 1 tablet (500 mg total) by mouth 3 (three) times daily.  Marland Kitchen omeprazole (PRILOSEC) 40 MG capsule Take 1 capsule (40 mg total) by mouth daily.  . simvastatin (ZOCOR) 40 MG tablet Take 1 tablet (40 mg total) by mouth every evening.  Marland Kitchen amoxicillin-clavulanate (AUGMENTIN) 875-125 MG per tablet Take 1 tablet by mouth 2 (two) times daily.     No orders of the defined types were placed in this encounter.    No Follow-up on file.

## 2014-04-01 ENCOUNTER — Other Ambulatory Visit: Payer: Self-pay | Admitting: *Deleted

## 2014-04-01 ENCOUNTER — Ambulatory Visit: Payer: Medicare Other | Admitting: Internal Medicine

## 2014-04-01 DIAGNOSIS — K5732 Diverticulitis of large intestine without perforation or abscess without bleeding: Secondary | ICD-10-CM | POA: Insufficient documentation

## 2014-04-01 MED ORDER — HYDROCHLOROTHIAZIDE 12.5 MG PO TABS: 12.5000 mg | ORAL_TABLET | Freq: Every day | ORAL | Status: DC

## 2014-04-01 NOTE — Assessment & Plan Note (Signed)
Presumed by history ,  Has diverticulosis by colonoscopy in 2014 .  Improved symtoms and diarrhea resolved.  She was advised  to continue the flaygl since the sympotns are "nerves,"  Not GI intolerance or rash.

## 2014-05-16 ENCOUNTER — Ambulatory Visit (INDEPENDENT_AMBULATORY_CARE_PROVIDER_SITE_OTHER): Payer: Medicare Other | Admitting: Internal Medicine

## 2014-05-16 ENCOUNTER — Encounter: Payer: Self-pay | Admitting: Internal Medicine

## 2014-05-16 VITALS — BP 136/70 | HR 72 | Temp 98.6°F | Resp 18 | Ht 59.25 in | Wt 129.0 lb

## 2014-05-16 DIAGNOSIS — R5381 Other malaise: Secondary | ICD-10-CM

## 2014-05-16 DIAGNOSIS — M5430 Sciatica, unspecified side: Secondary | ICD-10-CM

## 2014-05-16 DIAGNOSIS — M5431 Sciatica, right side: Secondary | ICD-10-CM

## 2014-05-16 DIAGNOSIS — E871 Hypo-osmolality and hyponatremia: Secondary | ICD-10-CM

## 2014-05-16 DIAGNOSIS — J449 Chronic obstructive pulmonary disease, unspecified: Secondary | ICD-10-CM

## 2014-05-16 DIAGNOSIS — I10 Essential (primary) hypertension: Secondary | ICD-10-CM

## 2014-05-16 DIAGNOSIS — Z79899 Other long term (current) drug therapy: Secondary | ICD-10-CM

## 2014-05-16 DIAGNOSIS — Z0000 Encounter for general adult medical examination without abnormal findings: Secondary | ICD-10-CM

## 2014-05-16 DIAGNOSIS — E785 Hyperlipidemia, unspecified: Secondary | ICD-10-CM

## 2014-05-16 DIAGNOSIS — R5383 Other fatigue: Secondary | ICD-10-CM

## 2014-05-16 LAB — CBC WITH DIFFERENTIAL/PLATELET
Basophils Absolute: 0 10*3/uL (ref 0.0–0.1)
Basophils Relative: 0.5 % (ref 0.0–3.0)
Eosinophils Absolute: 0.1 10*3/uL (ref 0.0–0.7)
Eosinophils Relative: 2.7 % (ref 0.0–5.0)
HCT: 36.3 % (ref 36.0–46.0)
Hemoglobin: 12 g/dL (ref 12.0–15.0)
LYMPHS PCT: 27 % (ref 12.0–46.0)
Lymphs Abs: 1.3 10*3/uL (ref 0.7–4.0)
MCHC: 33.1 g/dL (ref 30.0–36.0)
MCV: 85.9 fl (ref 78.0–100.0)
MONOS PCT: 11.8 % (ref 3.0–12.0)
Monocytes Absolute: 0.6 10*3/uL (ref 0.1–1.0)
Neutro Abs: 2.8 10*3/uL (ref 1.4–7.7)
Neutrophils Relative %: 58 % (ref 43.0–77.0)
PLATELETS: 238 10*3/uL (ref 150.0–400.0)
RBC: 4.22 Mil/uL (ref 3.87–5.11)
RDW: 14.2 % (ref 11.5–15.5)
WBC: 4.8 10*3/uL (ref 4.0–10.5)

## 2014-05-16 LAB — COMPREHENSIVE METABOLIC PANEL
ALT: 11 U/L (ref 0–35)
AST: 21 U/L (ref 0–37)
Albumin: 3.7 g/dL (ref 3.5–5.2)
Alkaline Phosphatase: 55 U/L (ref 39–117)
BILIRUBIN TOTAL: 0.3 mg/dL (ref 0.2–1.2)
BUN: 16 mg/dL (ref 6–23)
CO2: 29 mEq/L (ref 19–32)
Calcium: 9.3 mg/dL (ref 8.4–10.5)
Chloride: 97 mEq/L (ref 96–112)
Creatinine, Ser: 1.1 mg/dL (ref 0.4–1.2)
GFR: 52.24 mL/min — AB (ref >60.00)
GLUCOSE: 88 mg/dL (ref 70–99)
Potassium: 4.7 mEq/L (ref 3.5–5.1)
SODIUM: 133 meq/L — AB (ref 135–145)
Total Protein: 6.6 g/dL (ref 6.0–8.3)

## 2014-05-16 LAB — TSH: TSH: 0.7 u[IU]/mL (ref 0.35–4.50)

## 2014-05-16 NOTE — Assessment & Plan Note (Signed)
Patient refuses further workup and analgesics.  Remains capable of all ADLs

## 2014-05-16 NOTE — Assessment & Plan Note (Addendum)
Well controlled on current statin therapy by October labs. .   Liver enzymes are normal today , no changes today.  Lab Results  Component Value Date   CHOL 209* 10/16/2013   HDL 58.00 10/16/2013   LDLCALC 105* 01/24/2013   LDLDIRECT 131.2 10/16/2013   TRIG 181.0* 10/16/2013   CHOLHDL 4 10/16/2013   Lab Results  Component Value Date   ALT 11 05/16/2014   AST 21 05/16/2014   ALKPHOS 55 05/16/2014   BILITOT 0.3 05/16/2014

## 2014-05-16 NOTE — Assessment & Plan Note (Addendum)
Annual comprehensive exam was done including breast, excluding pelvic and PAP smear. All screenings have been addressed .  Patient instructed to recheck her left breast in a week.  If it is still sore will order a "diagnostic" mammogram of the left brea

## 2014-05-16 NOTE — Patient Instructions (Signed)
You had your annual  wellness exam today.  Recheck your left breast in a week.  If it is still sore on the spot over your sternum,  Call me and will order a "diagnostic" mammogram of the left breast.  We will contact you with the bloodwork results

## 2014-05-16 NOTE — Progress Notes (Signed)
Pre-visit discussion using our clinic review tool. No additional management support is needed unless otherwise documented below in the visit note.  

## 2014-05-16 NOTE — Progress Notes (Signed)
Patient ID: Caitlin Mason, female   DOB: [birth year], 79 y.o.   MRN: [id number]  The patient is here for annual Medicare wellness examination and management of other chronic and acute problems.  Has been having decreased appetite,  Finding herself falling asleep in the recliner after eating lunch..  Sometimes feels very tired after walking to the mailbox,  Not regularly  More sporadically.  Has a history of tobacco abuse and spinal stenosis,  Still capable of ADLs but doesn't mow the grass anymore.     The risk factors are reflected in the social history.  The roster of all physicians providing medical care to patient - is listed in the Snapshot section of the chart.  Activities of daily living:  The patient is 100% independent in all ADLs: dressing, toileting, feeding as well as independent mobility  Home safety : The patient has smoke detectors in the home. They wear seatbelts.  There are no firearms at home. There is no violence in the home.   There is no risks for hepatitis, STDs or HIV. There is no   history of blood transfusion. They have no travel history to infectious disease endemic areas of the world.  The patient has seen their dentist in the last six month. They have seen their eye doctor in the last year. They admit to slight hearing difficulty with regard to whispered voices and some television programs.  They have deferred audiologic testing in the last year.  They do not  have excessive sun exposure. Discussed the need for sun protection: hats, long sleeves and use of sunscreen if there is significant sun exposure.   Diet: the importance of a healthy diet is discussed. They do have a healthy diet.  The benefits of regular aerobic exercise were discussed. She does not exercise due to back pain    Depression screen: there are no signs or vegative symptoms of depression- irritability, change in appetite, anhedonia, sadness/tearfullness. She does miss her her beloved cat who died  2012/01/05 and gets tearful talking about her   Cognitive assessment: the patient manages all their financial and personal affairs and is actively engaged. They could relate day,date,year and events; recalled 3/3 objects at 3 minutes; performed clock-face test normally.MMSE 30/30.   The following portions of the patient's history were reviewed and updated as appropriate: allergies, current medications, past family history, past medical history,  past surgical history, past social history  and problem list.  Visual acuity was not assessed per patient preference since she has regular follow up with her ophthalmologist. Hearing and body mass index were assessed and reviewed. Has macular degeneration in left eye.   During the course of the visit the patient was educated and counseled about appropriate screening and preventive services including : fall prevention , diabetes screening, nutrition counseling, colorectal cancer screening, and recommended immunizations.     Objective BP 136/70  Pulse 72  Temp(Src) 98.6 F (37 C) (Oral)  Resp 18  Ht 4' 11.25" (1.505 m)  Wt 129 lb (58.514 kg)  BMI 25.83 kg/m2  SpO2 96%   General appearance: alert, cooperative and appears stated age Head: Normocephalic, without obvious abnormality, atraumatic Eyes: conjunctivae/corneas clear. PERRL, EOM's intact. Fundi benign. Ears: normal TM's and external ear canals both ears Nose: Nares normal. Septum midline. Mucosa normal. No drainage or sinus tenderness. Throat: lips, mucosa, and tongue normal; teeth and gums normal Neck: no adenopathy, no carotid bruit, no JVD, supple, symmetrical, trachea midline and thyroid not enlarged, symmetric,  no tenderness/mass/nodules Lungs: clear to auscultation bilaterally Breasts: normal appearance, left breast tender at the sternum in the 9:00 position , no masses Heart: regular rate and rhythm, S1, S2 normal, no murmur, click, rub or gallop Abdomen: soft, non-tender; bowel  sounds normal; no masses,  no organomegaly Extremities: extremities normal, atraumatic, no cyanosis or edema Pulses: 2+ and symmetric Skin: Skin color, texture, turgor normal. No rashes or lesions Neurologic: Alert and oriented X 3, normal strength and tone. Normal symmetric reflexes. Normal coordination and gait.   Assessment and Plan:  Routine general medical examination at a health care facility Annual comprehensive exam was done including breast, excluding pelvic and PAP smear. All screenings have been addressed .  Patient instructed to recheck her left breast in a week.  If it is still sore will order a "diagnostic" mammogram of the left brea  COPD (chronic obstructive pulmonary disease) Secondary to history of tobacco abuse,  With progressive exercise intolerance reported today aggravated by her spinal stenosis  She was encouraged to increased daily activity  to include walking for 20 minutes.   HYPERLIPIDEMIA-MIXED Well controlled on current statin therapy by October labs. .   Liver enzymes are normal today , no changes today.  Lab Results  Component Value Date   CHOL 209* 10/16/2013   HDL 58.00 10/16/2013   LDLCALC 105* 01/24/2013   LDLDIRECT 131.2 10/16/2013   TRIG 181.0* 10/16/2013   CHOLHDL 4 10/16/2013   Lab Results  Component Value Date   ALT 11 05/16/2014   AST 21 05/16/2014   ALKPHOS 55 05/16/2014   BILITOT 0.3 05/16/2014     Sciatica of right side Patient refuses further workup and analgesics.  Remains capable of all ADLs  Hyponatremia  sodium level is a little low.  This may be due to the hydrochlorothiazide(HTCZ) she is taking for hypertension.  I would like her  to suspend this medication for a week and return for a  repeat sodum level and a nurse visit for BP check.    Essential hypertension, benign Well controlled on current regimen. Renal function stable, but sodium is low.  Suspending hct for a werek and repeat BMET/BP check one week .  Lab Results   Component Value Date   NA 133* 05/16/2014   K 4.7 05/16/2014   CL 97 05/16/2014   CO2 29 05/16/2014   Lab Results  Component Value Date   CREATININE 1.1 05/16/2014      Updated Medication List Outpatient Encounter Prescriptions as of 05/16/2014  Medication Sig  . ALPRAZolam (XANAX) 0.5 MG tablet Take 1 tablet (0.5 mg total) by mouth at bedtime as needed for sleep.  Marland Kitchen aspirin 81 MG EC tablet Take 81 mg by mouth daily.    . Cholecalciferol (VITAMIN D3) 400 UNITS tablet Take 1,000 Units by mouth daily.   . enalapril (VASOTEC) 5 MG tablet Take 1 tablet (5 mg total) by mouth daily.  . fexofenadine (ALLEGRA) 180 MG tablet Take 180 mg by mouth daily.    . metroNIDAZOLE (FLAGYL) 500 MG tablet Take 1 tablet (500 mg total) by mouth 3 (three) times daily.  Marland Kitchen omeprazole (PRILOSEC) 40 MG capsule Take 1 capsule (40 mg total) by mouth daily.  . simvastatin (ZOCOR) 40 MG tablet Take 1 tablet (40 mg total) by mouth every evening.  . [DISCONTINUED] hydrochlorothiazide (HYDRODIURIL) 12.5 MG tablet Take 1 tablet (12.5 mg total) by mouth daily.  . [DISCONTINUED] amoxicillin-clavulanate (AUGMENTIN) 875-125 MG per tablet Take 1 tablet by mouth 2 (  two) times daily.  . [DISCONTINUED] ciprofloxacin (CIPRO) 250 MG tablet Take 1 tablet (250 mg total) by mouth 2 (two) times daily.

## 2014-05-16 NOTE — Assessment & Plan Note (Signed)
Secondary to history of tobacco abuse,  With progressive exercise intolerance reported today aggravated by her spinal stenosis  She was encouraged to increased daily activity  to include walking for 20 minutes.

## 2014-05-17 LAB — VITAMIN D 25 HYDROXY (VIT D DEFICIENCY, FRACTURES): VIT D 25 HYDROXY: 64 ng/mL (ref 30–89)

## 2014-05-18 DIAGNOSIS — E871 Hypo-osmolality and hyponatremia: Secondary | ICD-10-CM | POA: Insufficient documentation

## 2014-05-18 DIAGNOSIS — I10 Essential (primary) hypertension: Secondary | ICD-10-CM | POA: Insufficient documentation

## 2014-05-18 NOTE — Assessment & Plan Note (Signed)
sodium level is a little low.  This may be due to the hydrochlorothiazide(HTCZ) she is taking for hypertension.  I would like her  to suspend this medication for a week and return for a  repeat sodum level and a nurse visit for BP check.

## 2014-05-18 NOTE — Assessment & Plan Note (Signed)
Well controlled on current regimen. Renal function stable, but sodium is low.  Suspending hct for a werek and repeat BMET/BP check one week .  Lab Results  Component Value Date   NA 133* 05/16/2014   K 4.7 05/16/2014   CL 97 05/16/2014   CO2 29 05/16/2014   Lab Results  Component Value Date   CREATININE 1.1 05/16/2014

## 2014-05-27 ENCOUNTER — Ambulatory Visit (INDEPENDENT_AMBULATORY_CARE_PROVIDER_SITE_OTHER): Payer: Medicare Other | Admitting: *Deleted

## 2014-05-27 ENCOUNTER — Other Ambulatory Visit (INDEPENDENT_AMBULATORY_CARE_PROVIDER_SITE_OTHER): Payer: Medicare Other

## 2014-05-27 ENCOUNTER — Telehealth: Payer: Self-pay | Admitting: *Deleted

## 2014-05-27 VITALS — BP 130/60 | HR 87

## 2014-05-27 DIAGNOSIS — I10 Essential (primary) hypertension: Secondary | ICD-10-CM

## 2014-05-27 DIAGNOSIS — E871 Hypo-osmolality and hyponatremia: Secondary | ICD-10-CM

## 2014-05-27 LAB — BASIC METABOLIC PANEL
BUN: 12 mg/dL (ref 6–23)
CALCIUM: 9.4 mg/dL (ref 8.4–10.5)
CO2: 28 mEq/L (ref 19–32)
Chloride: 103 mEq/L (ref 96–112)
Creatinine, Ser: 1 mg/dL (ref 0.4–1.2)
GFR: 55.79 mL/min — ABNORMAL LOW (ref >60.00)
Glucose, Bld: 99 mg/dL (ref 70–99)
POTASSIUM: 3.9 meq/L (ref 3.5–5.1)
SODIUM: 137 meq/L (ref 135–145)

## 2014-05-27 NOTE — Progress Notes (Deleted)
   Subjective:    Patient ID: Caitlin Mason, female    DOB: [birth year], 79 y.o.   MRN: [id number]  HPI    Review of Systems     Objective:   Physical Exam        Assessment & Plan:

## 2014-05-27 NOTE — Telephone Encounter (Signed)
bp is fine without the hctz

## 2014-05-27 NOTE — Telephone Encounter (Signed)
Pt presents for labs and BP check. Has stopped HCTZ as directed, no complaints today. BP 130/60 and HR 87

## 2014-05-27 NOTE — Progress Notes (Signed)
Pt presents for labs and BP check. Has stopped HCTZ as directed, no complaints today. BP 130/60 and HR 87. Dr. Derrel Nip notified.

## 2014-05-28 NOTE — Telephone Encounter (Signed)
Pt.notified

## 2014-06-27 ENCOUNTER — Telehealth: Payer: Self-pay | Admitting: Internal Medicine

## 2014-06-27 MED ORDER — ALBUTEROL SULFATE HFA 108 (90 BASE) MCG/ACT IN AERS: 2.0000 | INHALATION_SPRAY | Freq: Four times a day (QID) | RESPIRATORY_TRACT | Status: DC | PRN

## 2014-06-27 MED ORDER — OMEPRAZOLE 40 MG PO CPDR: 40.0000 mg | DELAYED_RELEASE_CAPSULE | Freq: Every day | ORAL | Status: DC

## 2014-06-27 MED ORDER — ENALAPRIL MALEATE 5 MG PO TABS: 5.0000 mg | ORAL_TABLET | Freq: Every day | ORAL | Status: DC

## 2014-06-27 NOTE — Telephone Encounter (Signed)
Pro air inhaler sent to pharmacy

## 2014-06-27 NOTE — Telephone Encounter (Signed)
Tried to call patient no answer and no voicemail

## 2014-06-27 NOTE — Telephone Encounter (Signed)
Patient refused visit with NP, Patient having SOB X 2 days coughing up clear fluid stated she wants MD to  Call in inhaler for HX of COPD patient coughing on phone evident shortness of breath. Tried to encourage patient that she needs to be seen patient refused. Please advise.

## 2014-06-30 NOTE — Telephone Encounter (Signed)
Patient notified of results.

## 2014-08-06 ENCOUNTER — Encounter: Payer: Self-pay | Admitting: Internal Medicine

## 2014-08-06 ENCOUNTER — Encounter: Payer: Self-pay | Admitting: *Deleted

## 2014-08-06 ENCOUNTER — Ambulatory Visit (INDEPENDENT_AMBULATORY_CARE_PROVIDER_SITE_OTHER): Payer: Medicare Other | Admitting: Internal Medicine

## 2014-08-06 VITALS — BP 128/70 | HR 75 | Temp 98.0°F | Resp 16 | Ht 59.25 in | Wt 127.2 lb

## 2014-08-06 DIAGNOSIS — R300 Dysuria: Secondary | ICD-10-CM

## 2014-08-06 DIAGNOSIS — R1032 Left lower quadrant pain: Secondary | ICD-10-CM

## 2014-08-06 DIAGNOSIS — K5732 Diverticulitis of large intestine without perforation or abscess without bleeding: Secondary | ICD-10-CM

## 2014-08-06 DIAGNOSIS — E871 Hypo-osmolality and hyponatremia: Secondary | ICD-10-CM

## 2014-08-06 DIAGNOSIS — I10 Essential (primary) hypertension: Secondary | ICD-10-CM

## 2014-08-06 LAB — URINALYSIS, ROUTINE W REFLEX MICROSCOPIC
Bilirubin Urine: NEGATIVE
Hgb urine dipstick: NEGATIVE
Ketones, ur: NEGATIVE
Leukocytes, UA: NEGATIVE
Nitrite: NEGATIVE
PH: 7 (ref 5.0–8.0)
RBC / HPF: NONE SEEN (ref 0–?)
SPECIFIC GRAVITY, URINE: 1.01 (ref 1.000–1.030)
TOTAL PROTEIN, URINE-UPE24: NEGATIVE
Urine Glucose: NEGATIVE
Urobilinogen, UA: 0.2 (ref 0.0–1.0)
WBC UA: NONE SEEN (ref 0–?)

## 2014-08-06 LAB — POCT URINALYSIS DIPSTICK
BILIRUBIN UA: NEGATIVE
Glucose, UA: NEGATIVE
KETONES UA: NEGATIVE
Leukocytes, UA: NEGATIVE
Nitrite, UA: NEGATIVE
Protein, UA: NEGATIVE
RBC UA: NEGATIVE
SPEC GRAV UA: 1.015
UROBILINOGEN UA: 0.2
pH, UA: 7

## 2014-08-06 MED ORDER — TRAMADOL HCL 50 MG PO TABS: 50.0000 mg | ORAL_TABLET | Freq: Three times a day (TID) | ORAL | Status: DC | PRN

## 2014-08-06 NOTE — Progress Notes (Signed)
Pre-visit discussion using our clinic review tool. No additional management support is needed unless otherwise documented below in the visit note.  

## 2014-08-06 NOTE — Progress Notes (Signed)
Patient ID: Caitlin Mason, female   DOB: [birth year], 79 y.o.   MRN: [id number]   Patient Active Problem List   Diagnosis Date Noted  . Hyponatremia 05/18/2014  . Essential hypertension, benign 05/18/2014  . Sciatica of right side 05/16/2014  . Routine general medical examination at a health care facility 05/14/2013  . Sciatica of left side 09/09/2012  . Lumbago 08/18/2012  . Cataract   . COPD (chronic obstructive pulmonary disease)   . Cystoid macular degeneration of right eye   . Insomnia, persistent 08/16/2011  . HYPERLIPIDEMIA-MIXED 09/30/2010    Subjective:  CC:   Chief Complaint  Patient presents with  . Abdominal Pain    on left side X 6 days,  having loose stools, eating a bland diet.    HPI:   Caitlin Mason is a 79 y.o. female who presents for resolving Left sided lower quadrant pain.  Her symptoms started 5 days ago or more and she treated it like a flare of diverticulitis with dietary changes.  She stopped eating corn, tomatoes,  Just ate potatoes and toast.  Denies fevers,  Reports that her abdomen felt very distended, with some flatulence but not as much as expected , and stools were softer and easier to move daily without the usual prune juice that she takes regularly for constipation.  She denies diarrhea and hematochezia,  today symptoms are much  better     Past Medical History  Diagnosis Date  . Cholelithiasis     asymptomatic  . Osteoporosis   . Plantar fasciitis     Morton's Neuroma  . Actinic keratosis     back, upper extremities: s/p liquid nitro 01/29/06  . Diverticulosis   . HLD (hyperlipidemia)   . HTN (hypertension)   . Depression   . Low back pain   . 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 COPD (chronic obstructive pulmonary disease)   . Cataract     s/p extraction 2007, 2005    Past Surgical History  Procedure Laterality Date  . Colonoscopy      3/09, one sigmoid polyp; no tics   .  Vesicovaginal fistula closure w/ tah    . Abdominal hysterectomy    . Nm myoview ltd  Oct 2011    normal, EF 89%, study limited by lung disease       The following portions of the patient's history were reviewed and updated as appropriate: Allergies, current medications, and problem list.    Review of Systems:   Patient denies headache, fevers, malaise, unintentional weight loss, skin rash, eye pain, sinus congestion and sinus pain, sore throat, dysphagia,  hemoptysis , cough, dyspnea, wheezing, chest pain, palpitations, orthopnea, edema, abdominal pain, nausea, melena, diarrhea, constipation, flank pain, dysuria, hematuria, urinary  Frequency, nocturia, numbness, tingling, seizures,  Focal weakness, Loss of consciousness,  Tremor, insomnia, depression, anxiety, and suicidal ideation.     History   Social History  . Marital Status: Widowed    Spouse Name: N/A    Number of Children: N/A  . Years of Education: N/A   Occupational History  . Not on file.   Social History Main Topics  . Smoking status: Former Smoker    Quit date: 08/15/1985  . Smokeless tobacco: Never Used     Comment: quit 20 years ago   . Alcohol Use: No  . Drug Use: No  . Sexual Activity: Not on file   Other Topics  Concern  . Not on file   Social History Narrative   Widowed, lives in [place], gets regular exercise by working in the yard.     Objective:  Filed Vitals:   08/06/14 0840  BP: 128/70  Pulse: 75  Temp: 98 F (36.7 C)  Resp: 16     General appearance: alert, cooperative and appears stated age. Nontoxic appearing  Neck: no adenopathy, no carotid bruit, supple, symmetrical, trachea midline and thyroid not enlarged, symmetric, no tenderness/mass/nodules Back: symmetric, no curvature. ROM normal. No CVA tenderness. Lungs: clear to auscultation bilaterally Heart: regular rate and rhythm, S1, S2 normal, no murmur, click, rub or gallop Abdomen: soft, tender in LLQ quadrant, no guarding  , rebound or masses.  bowel sounds hypoactive,  no organomegaly Pulses: 2+ and symmetric Skin: Skin color, texture, turgor normal. No rashes or lesions Lymph nodes: Cervical, supraclavicular, and axillary nodes normal.  Assessment and Plan: Diverticulitis of colon (without mention of hemorrhage) Resolved with dietary changes  She is still minimally tender in the llq but not guarding and the symptoms are improving .  Discussed role of partial colectomy to mitigatre risk of peritonitis with frequent attacks.  Need to document occurrent with CT next time.   LLQ pain She is still tender on exam without guarding.  UA is normal.  Her symptoms are improving. She has diverticulosis by last colonoscopy .  Advised to continue low residue diet for another week.  Discussed the increased risk of peritonitis with recurrent episodes of diverticulitis. We will need to document her next epiodes with CT scan before considering surgical referral for partial colectomy  Hyponatremia Transient , resolved by repeat lab with suspension of hctz  Essential hypertension, benign Managed with enalapril.  hctz dc'd due to hyponatremia/.    Updated Medication List Outpatient Encounter Prescriptions as of 08/06/2014  Medication Sig  . albuterol (PROAIR HFA) 108 (90 BASE) MCG/ACT inhaler Inhale 2 puffs into the lungs every 6 (six) hours as needed for wheezing or shortness of breath.  . ALPRAZolam (XANAX) 0.5 MG tablet Take 1 tablet (0.5 mg total) by mouth at bedtime as needed for sleep.  Marland Kitchen aspirin 81 MG EC tablet Take 81 mg by mouth daily.    . Cholecalciferol (VITAMIN D3) 400 UNITS tablet Take 1,000 Units by mouth daily.   . enalapril (VASOTEC) 5 MG tablet Take 1 tablet (5 mg total) by mouth daily.  . fexofenadine (ALLEGRA) 180 MG tablet Take 180 mg by mouth daily.    . simvastatin (ZOCOR) 40 MG tablet Take 1 tablet (40 mg total) by mouth every evening.  . traMADol (ULTRAM) 50 MG tablet Take 1 tablet (50 mg total) by  mouth every 8 (eight) hours as needed.  . [DISCONTINUED] metroNIDAZOLE (FLAGYL) 500 MG tablet Take 1 tablet (500 mg total) by mouth 3 (three) times daily.  . [DISCONTINUED] omeprazole (PRILOSEC) 40 MG capsule Take 1 capsule (40 mg total) by mouth daily.

## 2014-08-06 NOTE — Assessment & Plan Note (Addendum)
Resolved with dietary changes  She is still minimally tender in the llq but not guarding and the symptoms are improving .  Discussed role of partial colectomy to mitigatre risk of peritonitis with frequent attacks.  Need to document occurrent with CT next time.

## 2014-08-06 NOTE — Patient Instructions (Signed)
You appear to be recovering from a mild case of diverticulitis  You should call me the next time you start to have symptoms so we can document the episode with a CT scan   People who have 2 or more attacks per year may be better off having that part of the colon removed to prevent future attacks which can cause rupture of the colon and peritonitis  Trial of tramadol for pain.  Ok to combine with tylenol or motrin not more than 3 daily.

## 2014-08-08 ENCOUNTER — Encounter: Payer: Self-pay | Admitting: Internal Medicine

## 2014-08-08 ENCOUNTER — Telehealth: Payer: Self-pay | Admitting: *Deleted

## 2014-08-08 LAB — URINE CULTURE: Colony Count: 8000

## 2014-08-08 NOTE — Assessment & Plan Note (Signed)
Managed with enalapril.  hctz dc'd due to hyponatremia/.

## 2014-08-08 NOTE — Assessment & Plan Note (Signed)
Transient , resolved by repeat lab with suspension of hctz

## 2014-08-08 NOTE — Assessment & Plan Note (Addendum)
She is still tender on exam without guarding.  UA is normal.  Her symptoms are improving. She has diverticulosis by last colonoscopy .  Advised to continue low residue diet for another week.  Discussed the increased risk of peritonitis with recurrent episodes of diverticulitis. We will need to document her next epiodes with CT scan before considering surgical referral for partial colectomy

## 2014-08-08 NOTE — Telephone Encounter (Signed)
Called lab results to pt, verbalized understanding.  Notes Recorded by Crecencio Mc, MD on 08/07/2014 at 8:31 AM She has no signs of a UTI

## 2014-08-21 ENCOUNTER — Encounter: Payer: Self-pay | Admitting: Internal Medicine

## 2014-09-01 ENCOUNTER — Telehealth: Payer: Self-pay | Admitting: Internal Medicine

## 2014-09-01 NOTE — Telephone Encounter (Signed)
Patient notified and voiced understanding.

## 2014-09-01 NOTE — Telephone Encounter (Signed)
If pain resolved ,  No need for CT at this time

## 2014-09-01 NOTE — Telephone Encounter (Signed)
Patient has had an episode of left sided abdominal pain ( flank Pain) on 08/30/14 lasted approximately 4 hours patient stated she took goodies headache powder  And was able to retain relief patient stated she has not had pain since. Patient at last visit MD stated may need a CT if these episodes continue and patient wanted you to know that this pain did not feel like Diverticulitis pain. Please advise?

## 2014-09-23 ENCOUNTER — Other Ambulatory Visit: Payer: Self-pay | Admitting: Internal Medicine

## 2014-09-23 NOTE — Telephone Encounter (Signed)
Last refill 8.10.15, last OV 8.19.15.  please advise refill

## 2014-09-25 NOTE — Telephone Encounter (Signed)
The patient has not received her medication ( ALPRAZolam (XANAX) 0.5 MG tablet)  from [facility]. Please call into to the drug store.

## 2014-09-25 NOTE — Telephone Encounter (Signed)
Ok refill? 

## 2014-09-26 NOTE — Telephone Encounter (Signed)
Rx faxed

## 2014-09-26 NOTE — Telephone Encounter (Signed)
Ok to refill,  printed rx  

## 2014-10-15 ENCOUNTER — Other Ambulatory Visit: Payer: Self-pay | Admitting: Internal Medicine

## 2014-10-28 ENCOUNTER — Ambulatory Visit: Payer: Medicare Other

## 2014-11-05 ENCOUNTER — Ambulatory Visit (INDEPENDENT_AMBULATORY_CARE_PROVIDER_SITE_OTHER): Payer: Medicare Other | Admitting: *Deleted

## 2014-11-05 DIAGNOSIS — Z23 Encounter for immunization: Secondary | ICD-10-CM

## 2014-12-29 ENCOUNTER — Other Ambulatory Visit: Payer: Self-pay | Admitting: Internal Medicine

## 2015-01-01 ENCOUNTER — Ambulatory Visit (INDEPENDENT_AMBULATORY_CARE_PROVIDER_SITE_OTHER): Payer: Medicare Other | Admitting: Internal Medicine

## 2015-01-01 ENCOUNTER — Ambulatory Visit: Payer: Self-pay | Admitting: Internal Medicine

## 2015-01-01 ENCOUNTER — Encounter: Payer: Self-pay | Admitting: Internal Medicine

## 2015-01-01 VITALS — BP 146/80 | HR 79 | Temp 98.7°F | Resp 16 | Ht 59.25 in | Wt 127.5 lb

## 2015-01-01 DIAGNOSIS — M79605 Pain in left leg: Secondary | ICD-10-CM

## 2015-01-01 DIAGNOSIS — M79602 Pain in left arm: Secondary | ICD-10-CM

## 2015-01-01 NOTE — Progress Notes (Signed)
Patient ID: Caitlin Mason, female   DOB: Feb 16, 1935, 80 y.o.   MRN: [id number]   Patient Active Problem List   Diagnosis Date Noted  . Leg pain, left 01/04/2015  . Hyponatremia 05/18/2014  . Essential hypertension, benign 05/18/2014  . Sciatica of right side 05/16/2014  . Routine general medical examination at a health care facility 05/14/2013  . Sciatica of left side 09/09/2012  . Lumbago 08/18/2012  . Cataract   . COPD (chronic obstructive pulmonary disease)   . Cystoid macular degeneration of right eye   . Insomnia, persistent 08/16/2011  . HYPERLIPIDEMIA-MIXED 09/30/2010    Subjective:  CC:   Chief Complaint  Patient presents with  . Leg Pain    calf of left leg X 2 to 3 week at times feels like it has fever in the site.    HPI:   Caitlin Mason is a 80 y.o. female who presents for   Left lower leg pain and redness in posterior calf for the past  2-3 weeks,  Foot starting to go numb as of last night.  No recent travel or immobilization,  No recentr uinsect or animal bietes.  Denies fever, claudication symptoms    Past Medical History  Diagnosis Date  . Cholelithiasis     asymptomatic  . Osteoporosis   . Plantar fasciitis     Morton's Neuroma  . Actinic keratosis     back, upper extremities: s/p liquid nitro 01/29/06  . Diverticulosis   . HLD (hyperlipidemia)   . HTN (hypertension)   . Depression   . Low back pain   . 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 COPD (chronic obstructive pulmonary disease)   . Cataract     s/p extraction 2007, 2005    Past Surgical History  Procedure Laterality Date  . Colonoscopy      3/09, one sigmoid polyp; no tics   . Vesicovaginal fistula closure w/ tah    . Abdominal hysterectomy    . Nm myoview ltd  Oct 2011    normal, EF 89%, study limited by lung disease       The following portions of the patient's history were reviewed and updated as appropriate: Allergies,  current medications, and problem list.    Review of Systems:   Patient denies headache, fevers, malaise, unintentional weight loss, skin rash, eye pain, sinus congestion and sinus pain, sore throat, dysphagia,  hemoptysis , cough, dyspnea, wheezing, chest pain, palpitations, orthopnea, edema, abdominal pain, nausea, melena, diarrhea, constipation, flank pain, dysuria, hematuria, urinary  Frequency, nocturia, numbness, tingling, seizures,  Focal weakness, Loss of consciousness,  Tremor, insomnia, depression, anxiety, and suicidal ideation.     History   Social History  . Marital Status: Widowed    Spouse Name: N/A    Number of Children: N/A  . Years of Education: N/A   Occupational History  . Not on file.   Social History Main Topics  . Smoking status: Former Smoker    Quit date: 08/15/1985  . Smokeless tobacco: Never Used     Comment: quit 20 years ago   . Alcohol Use: No  . Drug Use: No  . Sexual Activity: Not on file   Other Topics Concern  . Not on file   Social History Narrative   Widowed, lives in [place], gets regular exercise by working in the yard.     Objective:  Filed Vitals:   01/01/15 1632  BP: 146/80  Pulse: 79  Temp: 98.7 F (37.1 C)  Resp: 16     General appearance: alert, cooperative and appears stated age Ears: normal TM's and external ear canals both ears Throat: lips, mucosa, and tongue normal; teeth and gums normal Neck: no adenopathy, no carotid bruit, supple, symmetrical, trachea midline and thyroid not enlarged, symmetric, no tenderness/mass/nodules Back: symmetric, no curvature. ROM normal. No CVA tenderness. Lungs: clear to auscultation bilaterally Heart: regular rate and rhythm, S1, S2 normal, no murmur, click, rub or gallop Abdomen: soft, non-tender; bowel sounds normal; no masses,  no organomegaly Pulses: 2+ and symmetric Skin: Skin color, texture, turgor normal. No rashes or lesions Ext: mild asymmetry left calf greater than  right, no cellulitis  Lymph nodes: Cervical, supraclavicular, and axillary nodes normal.  Assessment and Plan:  Leg pain, left Patient was sent for urgent doppler ultrasound which ruled out DVT. reassurance  Provided that there are no signs of cellulitis or ischemia. Alternate etiologies may include referred pain from sciatica vs achilles tendonitis.     Updated Medication List Outpatient Encounter Prescriptions as of 01/01/2015  Medication Sig  . albuterol (PROAIR HFA) 108 (90 BASE) MCG/ACT inhaler Inhale 2 puffs into the lungs every 6 (six) hours as needed for wheezing or shortness of breath.  . ALPRAZolam (XANAX) 1 MG tablet TAKE 1/2 TABLET BY MOUTH AT BEDTIME AS NEEDED FOR SLEEP  . aspirin 81 MG EC tablet Take 81 mg by mouth daily.    . Cholecalciferol (VITAMIN D3) 400 UNITS tablet Take 1,000 Units by mouth daily.   . enalapril (VASOTEC) 5 MG tablet TAKE 1 TABLET BY MOUTH EACH DAY  . fexofenadine (ALLEGRA) 180 MG tablet Take 180 mg by mouth daily.    Marland Kitchen omeprazole (PRILOSEC) 40 MG capsule TAKE ONE (1) CAPSULE EACH DAY  . simvastatin (ZOCOR) 40 MG tablet TAKE 1 TABLET BY MOUTH EVERY EVENING  . ALPRAZolam (XANAX) 0.5 MG tablet Take 1 tablet (0.5 mg total) by mouth at bedtime as needed for sleep. (Patient not taking: Reported on 01/01/2015)  . traMADol (ULTRAM) 50 MG tablet Take 1 tablet (50 mg total) by mouth every 8 (eight) hours as needed. (Patient not taking: Reported on 01/01/2015)     Orders Placed This Encounter  Procedures  . US Venous Img Lower Unilateral Left    No Follow-up on file.

## 2015-01-01 NOTE — Patient Instructions (Signed)
Your ultrasound is scheduled to be done at [facility] to rule out a blood clot

## 2015-01-01 NOTE — Progress Notes (Signed)
Pre-visit discussion using our clinic review tool. No additional management support is needed unless otherwise documented below in the visit note.  

## 2015-01-02 ENCOUNTER — Ambulatory Visit: Payer: Self-pay | Admitting: Internal Medicine

## 2015-01-02 ENCOUNTER — Telehealth: Payer: Self-pay | Admitting: Internal Medicine

## 2015-01-02 LAB — HM MAMMOGRAPHY: HM MAMMO: NEGATIVE

## 2015-01-02 NOTE — Telephone Encounter (Signed)
Confirm that she was told there was no DVT in her leg.  I do not think she has cellulitis either,  She may just have a muscle strain on tendon inflammation.  If not better with motrin  Or aleve in 2 weeks, let me know

## 2015-01-02 NOTE — Telephone Encounter (Signed)
Patient notified and voiced understanding.

## 2015-01-04 DIAGNOSIS — M79605 Pain in left leg: Secondary | ICD-10-CM | POA: Insufficient documentation

## 2015-01-04 NOTE — Assessment & Plan Note (Addendum)
Patient was sent for urgent doppler ultrasound which ruled out DVT. reassurance  Provided that there are no signs of cellulitis or ischemia. Alternate etiologies may include referred pain from sciatica vs achilles tendonitis.

## 2015-01-06 ENCOUNTER — Encounter: Payer: Self-pay | Admitting: Internal Medicine

## 2015-01-20 ENCOUNTER — Encounter: Payer: Self-pay | Admitting: Internal Medicine

## 2015-01-29 ENCOUNTER — Other Ambulatory Visit: Payer: Self-pay | Admitting: Internal Medicine

## 2015-04-13 ENCOUNTER — Ambulatory Visit
Admit: 2015-04-13 | Disposition: A | Discharge status: Home / Self Care | Payer: Self-pay | Attending: Internal Medicine | Admitting: Internal Medicine

## 2015-04-13 ENCOUNTER — Ambulatory Visit (INDEPENDENT_AMBULATORY_CARE_PROVIDER_SITE_OTHER): Payer: Medicare Other | Admitting: Internal Medicine

## 2015-04-13 ENCOUNTER — Encounter: Payer: Self-pay | Admitting: Internal Medicine

## 2015-04-13 VITALS — BP 126/64 | HR 80 | Temp 98.6°F | Resp 14 | Ht 59.25 in | Wt 127.5 lb

## 2015-04-13 DIAGNOSIS — K5730 Diverticulosis of large intestine without perforation or abscess without bleeding: Secondary | ICD-10-CM | POA: Insufficient documentation

## 2015-04-13 DIAGNOSIS — K5901 Slow transit constipation: Secondary | ICD-10-CM | POA: Diagnosis not present

## 2015-04-13 DIAGNOSIS — R1032 Left lower quadrant pain: Secondary | ICD-10-CM | POA: Diagnosis not present

## 2015-04-13 DIAGNOSIS — K5900 Constipation, unspecified: Secondary | ICD-10-CM | POA: Insufficient documentation

## 2015-04-13 MED ORDER — LACTULOSE 20 GM/30ML PO SOLN: ORAL | Status: DC

## 2015-04-13 NOTE — Patient Instructions (Addendum)
You need to use a bulk forming laxative on a daily basis,  You can add it to your prune juice.   Use the lactulose if you do not have a BM in 3 days  You can take a dose today,  It usually works in about 4 hours   X rays of your abdomen today at [facility] .

## 2015-04-13 NOTE — Progress Notes (Signed)
Patient ID: Caitlin Mason, female   DOB: 03-08-1935, 80 y.o.   MRN: [id number]   Patient Active Problem List   Diagnosis Date Noted  . Constipation 04/13/2015  . Diverticulosis of colon without hemorrhage 04/13/2015  . Leg pain, left 01/04/2015  . Hyponatremia 05/18/2014  . Essential hypertension, benign 05/18/2014  . Sciatica of right side 05/16/2014  . Routine general medical examination at a health care facility 05/14/2013  . Sciatica of left side 09/09/2012  . Lumbago 08/18/2012  . Cataract   . COPD (chronic obstructive pulmonary disease)   . Cystoid macular degeneration of right eye   . Insomnia, persistent 08/16/2011  . HYPERLIPIDEMIA-MIXED 09/30/2010    Subjective:  CC:   Chief Complaint  Patient presents with  . Acute Visit    had constipation for 3 weeks tried prune juice BID and BM still constipation then tried dulcolax 2 and awoke with a tremendous pain in bowels had 2 LG BM and broke out in sweat.  . Anorexia    Makes herself eat. pain across hypogastric pain.    HPI:   Caitlin Mason is a 80 y.o. female who presents for  Constipation,  Previously managed with prune juice, which recently became ineffective.  Stopped working ,  Started drinking a second glass daily and hot coffee,  but bowels still not moving daily and she has no appetite .Has been having intense pain which started about a week ago,  Has been eating cabbage, but avoiding salad .  She took a dose of dulcolax after having no BM for 3 days and it was effective but the moving of the bowels was so painful she broke a sweat , and vomited.   Water intake daily is about 20 ounces    Past Medical History  Diagnosis Date  . Cholelithiasis     asymptomatic  . Osteoporosis   . Plantar fasciitis     Morton's Neuroma  . Actinic keratosis     back, upper extremities: s/p liquid nitro 01/29/06  . Diverticulosis   . HLD (hyperlipidemia)   . HTN (hypertension)   . Depression   . Low back pain    . 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 COPD (chronic obstructive pulmonary disease)   . Cataract     s/p extraction 2007, 2005    Past Surgical History  Procedure Laterality Date  . Colonoscopy      3/09, one sigmoid polyp; no tics   . Vesicovaginal fistula closure w/ tah    . Abdominal hysterectomy    . [facility]m myoview ltd  Oct 2011    normal, EF 89%, study limited by lung disease       The following portions of the patient's history were reviewed and updated as appropriate: Allergies, current medications, and problem list.    Review of Systems:   Patient denies headache, fevers, malaise, unintentional weight loss, skin rash, eye pain, sinus congestion and sinus pain, sore throat, dysphagia,  hemoptysis , cough, dyspnea, wheezing, chest pain, palpitations, orthopnea, edema, abdominal pain, nausea, melena, diarrhea, constipation, flank pain, dysuria, hematuria, urinary  Frequency, nocturia, numbness, tingling, seizures,  Focal weakness, Loss of consciousness,  Tremor, insomnia, depression, anxiety, and suicidal ideation.     History   Social History  . Marital Status: Widowed    Spouse Name: N/A  . Number of Children: N/A  . Years of Education: N/A   Occupational History  . Not  on file.   Social History Main Topics  . Smoking status: Former Smoker    Quit date: 08/15/1985  . Smokeless tobacco: Never Used     Comment: quit 20 years ago   . Alcohol Use: No  . Drug Use: No  . Sexual Activity: Not on file   Other Topics Concern  . Not on file   Social History Narrative   Widowed, lives in [place], gets regular exercise by working in the yard.     Objective:  Filed Vitals:   04/13/15 1603  BP: 126/64  Pulse: 80  Temp: 98.6 F (37 C)  Resp: 14     General appearance: alert, cooperative and appears stated age Ears: normal TM's and external ear canals both ears Throat: lips, mucosa, and tongue normal; teeth and  gums normal Neck: no adenopathy, no carotid bruit, supple, symmetrical, trachea midline and thyroid not enlarged, symmetric, no tenderness/mass/nodules Back: symmetric, no curvature. ROM normal. No CVA tenderness. Lungs: clear to auscultation bilaterally Heart: regular rate and rhythm, S1, S2 normal, no murmur, click, rub or gallop Abdomen: soft, non-tender; bowel sounds normal; no masses,  no organomegaly Pulses: 2+ and symmetric Skin: Skin color, texture, turgor normal. No rashes or lesions Lymph nodes: Cervical, supraclavicular, and axillary nodes normal.  Assessment and Plan:  Constipation Thyroid, cbc , lytes normal. Ordered plain films to evaluate bowel gas pattern. Plain films show a normal BGP, no free air .  Recommending avoidance of stimulant laxatives and use of Bulk forming laxatives daily   lactulose prn q 4 days . Lab Results  Component Value Date   CREATININE 1.03 04/13/2015   Lab Results  Component Value Date   TSH 1.24 04/13/2015     Lab Results  Component Value Date   WBC 6.3 04/13/2015   HGB 12.7 04/13/2015   HCT 38.0 04/13/2015   MCV 84.4 04/13/2015   PLT 222.0 04/13/2015   Lab Results  Component Value Date   NA 136 04/13/2015   K 4.2 04/13/2015   CL 99 04/13/2015   CO2 30 04/13/2015       Updated Medication List Outpatient Encounter Prescriptions as of 04/13/2015  Medication Sig  . albuterol (PROAIR HFA) 108 (90 BASE) MCG/ACT inhaler Inhale 2 puffs into the lungs every 6 (six) hours as needed for wheezing or shortness of breath.  . ALPRAZolam (XANAX) 1 MG tablet TAKE 1/2 TABLET BY MOUTH AT BEDTIME AS NEEDED FOR SLEEP  . aspirin 81 MG EC tablet Take 81 mg by mouth daily.    . Cholecalciferol (VITAMIN D3) 400 UNITS tablet Take 1,000 Units by mouth daily.   . enalapril (VASOTEC) 5 MG tablet TAKE 1 TABLET BY MOUTH EACH DAY  . fexofenadine (ALLEGRA) 180 MG tablet Take 180 mg by mouth daily.    Marland Kitchen omeprazole (PRILOSEC) 40 MG capsule TAKE ONE (1)  CAPSULE EACH DAY  . simvastatin (ZOCOR) 40 MG tablet TAKE 1 TABLET EVERY EVENING FOR CHOLESTEROL  . ALPRAZolam (XANAX) 0.5 MG tablet Take 1 tablet (0.5 mg total) by mouth at bedtime as needed for sleep. (Patient not taking: Reported on 04/13/2015)  . Lactulose 20 GM/30ML SOLN 30 ml every 4 hours until constipation is relieved  . traMADol (ULTRAM) 50 MG tablet Take 1 tablet (50 mg total) by mouth every 8 (eight) hours as needed. (Patient not taking: Reported on 01/01/2015)     Orders Placed This Encounter  Procedures  . DG Abd 2 Views  . CBC with Differential/Platelet  . Comprehensive  metabolic panel  . TSH  . CA 125    No Follow-up on file.

## 2015-04-13 NOTE — Progress Notes (Signed)
Pre-visit discussion using our clinic review tool. No additional management support is needed unless otherwise documented below in the visit note.  

## 2015-04-14 ENCOUNTER — Encounter: Payer: Self-pay | Admitting: Internal Medicine

## 2015-04-14 LAB — COMPREHENSIVE METABOLIC PANEL
ALK PHOS: 67 U/L (ref 39–117)
ALT: 7 U/L (ref 0–35)
AST: 16 U/L (ref 0–37)
Albumin: 3.8 g/dL (ref 3.5–5.2)
BUN: 17 mg/dL (ref 6–23)
CALCIUM: 9.6 mg/dL (ref 8.4–10.5)
CO2: 30 meq/L (ref 19–32)
Chloride: 99 mEq/L (ref 96–112)
Creatinine, Ser: 1.03 mg/dL (ref 0.40–1.20)
GFR: 55.04 mL/min — ABNORMAL LOW (ref >60.00)
GLUCOSE: 102 mg/dL — AB (ref 70–99)
Potassium: 4.2 mEq/L (ref 3.5–5.1)
Sodium: 136 mEq/L (ref 135–145)
Total Bilirubin: 0.3 mg/dL (ref 0.2–1.2)
Total Protein: 5.9 g/dL — ABNORMAL LOW (ref 6.0–8.3)

## 2015-04-14 LAB — CBC WITH DIFFERENTIAL/PLATELET
BASOS ABS: 0.1 10*3/uL (ref 0.0–0.1)
Basophils Relative: 1.1 % (ref 0.0–3.0)
EOS PCT: 2.4 % (ref 0.0–5.0)
Eosinophils Absolute: 0.2 10*3/uL (ref 0.0–0.7)
HCT: 38 % (ref 36.0–46.0)
Hemoglobin: 12.7 g/dL (ref 12.0–15.0)
LYMPHS ABS: 1.3 10*3/uL (ref 0.7–4.0)
Lymphocytes Relative: 20.8 % (ref 12.0–46.0)
MCHC: 33.3 g/dL (ref 30.0–36.0)
MCV: 84.4 fl (ref 78.0–100.0)
MONOS PCT: 6.2 % (ref 3.0–12.0)
Monocytes Absolute: 0.4 10*3/uL (ref 0.1–1.0)
Neutro Abs: 4.4 10*3/uL (ref 1.4–7.7)
Neutrophils Relative %: 69.5 % (ref 43.0–77.0)
PLATELETS: 222 10*3/uL (ref 150.0–400.0)
RBC: 4.51 Mil/uL (ref 3.87–5.11)
RDW: 15.4 % (ref 11.5–15.5)
WBC: 6.3 10*3/uL (ref 4.0–10.5)

## 2015-04-14 LAB — TSH: TSH: 1.24 u[IU]/mL (ref 0.35–4.50)

## 2015-04-14 LAB — CA 125: CA 125: 90 U/mL — ABNORMAL HIGH (ref <35)

## 2015-04-14 NOTE — Assessment & Plan Note (Addendum)
Thyroid, cbc , lytes normal. Ordered plain films to evaluate bowel gas pattern. Plain films show a normal BGP, no free air .  Recommending avoidance of stimulant laxatives and use of Bulk forming laxatives daily   lactulose prn q 4 days . Lab Results  Component Value Date   CREATININE 1.03 04/13/2015   Lab Results  Component Value Date   TSH 1.24 04/13/2015     Lab Results  Component Value Date   WBC 6.3 04/13/2015   HGB 12.7 04/13/2015   HCT 38.0 04/13/2015   MCV 84.4 04/13/2015   PLT 222.0 04/13/2015   Lab Results  Component Value Date   NA 136 04/13/2015   K 4.2 04/13/2015   CL 99 04/13/2015   CO2 30 04/13/2015

## 2015-04-15 ENCOUNTER — Other Ambulatory Visit: Payer: Self-pay | Admitting: Internal Medicine

## 2015-04-15 DIAGNOSIS — R971 Elevated cancer antigen 125 [CA 125]: Secondary | ICD-10-CM | POA: Insufficient documentation

## 2015-04-15 DIAGNOSIS — R140 Abdominal distension (gaseous): Secondary | ICD-10-CM

## 2015-04-22 ENCOUNTER — Ambulatory Visit
Admission: RE | Admit: 2015-04-22 | Discharge: 2015-04-22 | Disposition: A | Discharge status: Home / Self Care | Payer: Medicare Other | Source: Ambulatory Visit | Attending: Internal Medicine | Admitting: Internal Medicine

## 2015-04-22 DIAGNOSIS — R140 Abdominal distension (gaseous): Secondary | ICD-10-CM | POA: Diagnosis present

## 2015-04-22 DIAGNOSIS — K5730 Diverticulosis of large intestine without perforation or abscess without bleeding: Secondary | ICD-10-CM | POA: Diagnosis not present

## 2015-04-22 DIAGNOSIS — R971 Elevated cancer antigen 125 [CA 125]: Secondary | ICD-10-CM | POA: Diagnosis present

## 2015-04-22 MED ORDER — IOHEXOL 300 MG/ML  SOLN
85.0000 mL | Freq: Once | INTRAMUSCULAR | Status: AC | PRN
  Administered 2015-04-22: 85 mL via INTRAVENOUS

## 2015-04-23 ENCOUNTER — Ambulatory Visit: Admission: RE | Admit: 2015-04-23 | Payer: Medicare Other | Source: Ambulatory Visit

## 2015-05-19 ENCOUNTER — Encounter: Payer: Medicare Other | Admitting: Internal Medicine

## 2015-05-19 ENCOUNTER — Other Ambulatory Visit (INDEPENDENT_AMBULATORY_CARE_PROVIDER_SITE_OTHER): Payer: Medicare Other

## 2015-05-19 ENCOUNTER — Telehealth: Payer: Self-pay | Admitting: *Deleted

## 2015-05-19 DIAGNOSIS — E785 Hyperlipidemia, unspecified: Secondary | ICD-10-CM | POA: Diagnosis not present

## 2015-05-19 DIAGNOSIS — R5383 Other fatigue: Secondary | ICD-10-CM

## 2015-05-19 NOTE — Telephone Encounter (Signed)
Labs and dx?  

## 2015-05-20 ENCOUNTER — Encounter: Payer: Self-pay | Admitting: Internal Medicine

## 2015-05-20 ENCOUNTER — Ambulatory Visit (INDEPENDENT_AMBULATORY_CARE_PROVIDER_SITE_OTHER): Payer: Medicare Other | Admitting: Internal Medicine

## 2015-05-20 VITALS — BP 136/60 | HR 84 | Temp 98.6°F | Resp 16 | Ht 59.0 in | Wt 128.5 lb

## 2015-05-20 DIAGNOSIS — K5900 Constipation, unspecified: Secondary | ICD-10-CM | POA: Diagnosis not present

## 2015-05-20 DIAGNOSIS — R971 Elevated cancer antigen 125 [CA 125]: Secondary | ICD-10-CM

## 2015-05-20 DIAGNOSIS — Z0000 Encounter for general adult medical examination without abnormal findings: Secondary | ICD-10-CM | POA: Diagnosis not present

## 2015-05-20 DIAGNOSIS — I10 Essential (primary) hypertension: Secondary | ICD-10-CM | POA: Diagnosis not present

## 2015-05-20 LAB — COMPREHENSIVE METABOLIC PANEL
ALT: 9 U/L (ref 0–35)
AST: 20 U/L (ref 0–37)
Albumin: 4.1 g/dL (ref 3.5–5.2)
Alkaline Phosphatase: 61 U/L (ref 39–117)
BUN: 17 mg/dL (ref 6–23)
CALCIUM: 9.9 mg/dL (ref 8.4–10.5)
CO2: 32 mEq/L (ref 19–32)
Chloride: 101 mEq/L (ref 96–112)
Creatinine, Ser: 1.08 mg/dL (ref 0.40–1.20)
GFR: 52.1 mL/min — ABNORMAL LOW (ref >60.00)
GLUCOSE: 89 mg/dL (ref 70–99)
POTASSIUM: 4.5 meq/L (ref 3.5–5.1)
SODIUM: 137 meq/L (ref 135–145)
Total Bilirubin: 0.4 mg/dL (ref 0.2–1.2)
Total Protein: 6.8 g/dL (ref 6.0–8.3)

## 2015-05-20 LAB — LIPID PANEL
CHOLESTEROL: 206 mg/dL — AB (ref 0–200)
HDL: 64.7 mg/dL (ref >39.00)
LDL CALC: 116 mg/dL — AB (ref 0–99)
NonHDL: 141.3
TRIGLYCERIDES: 127 mg/dL (ref 0.0–149.0)
Total CHOL/HDL Ratio: 3
VLDL: 25.4 mg/dL (ref 0.0–40.0)

## 2015-05-20 NOTE — Patient Instructions (Signed)
I would like to order a PET scan ,  Because even though the CT scan was positive, I cannot explain why the CA 125 test was  So positive (abnormal).  It might indicate a Cancer that the CT scan could not detect,  Or it might be a "fluke"  If the PET scan is normal,  I will feel much more confident that you do NOT have a cancer somewhere in your vody  If it is positive,  We will find out where the cancer is hiding

## 2015-05-20 NOTE — Progress Notes (Signed)
Pre-visit discussion using our clinic review tool. No additional management support is needed unless otherwise documented below in the visit note.  

## 2015-05-22 ENCOUNTER — Encounter: Payer: Self-pay | Admitting: *Deleted

## 2015-05-23 ENCOUNTER — Encounter: Payer: Self-pay | Admitting: Internal Medicine

## 2015-05-23 NOTE — Assessment & Plan Note (Signed)
Her CT scan failed to show an adnexal mass, but given the elevation of the CA 125 I am concerned that she has an occult mass.  PET scan has been ordered.

## 2015-05-23 NOTE — Assessment & Plan Note (Signed)
Well controlled on current regimen. Renal function stable, no changes today. 

## 2015-05-23 NOTE — Assessment & Plan Note (Signed)

## 2015-05-23 NOTE — Assessment & Plan Note (Signed)
Resolved with regular se of stool softener

## 2015-05-23 NOTE — Progress Notes (Signed)
Patient ID: Caitlin Mason, female    DOB: 01/27/1935  Age: 80 y.o. MRN: [id number]  The patient is here for annual Medicare wellness examination and management of other chronic and acute problems.   The risk factors are reflected in the social history.  The roster of all physicians providing medical care to patient - is listed in the Snapshot section of the chart.  Activities of daily living:  The patient is 100% independent in all ADLs: dressing, toileting, feeding as well as independent mobility  Home safety : The patient has smoke detectors in the home. They wear seatbelts.  There are no firearms at home. There is no violence in the home.   There is no risks for hepatitis, STDs or HIV. There is no   history of blood transfusion. They have no travel history to infectious disease endemic areas of the world.  The patient has seen their dentist in the last six month. They have seen their eye doctor in the last year. They admit to slight hearing difficulty with regard to whispered voices and some television programs.  They have deferred audiologic testing in the last year.  They do not  have excessive sun exposure. Discussed the need for sun protection: hats, long sleeves and use of sunscreen if there is significant sun exposure.   Diet: the importance of a healthy diet is discussed. They do have a healthy diet.  The benefits of regular aerobic exercise were discussed. She walks 4 times per week ,  20 minutes.   Depression screen: there are no signs or vegative symptoms of depression- irritability, change in appetite, anhedonia, sadness/tearfullness.  Cognitive assessment: the patient manages all their financial and personal affairs and is actively engaged. They could relate day,date,year and events; recalled 2/3 objects at 3 minutes; performed clock-face test normally.  The following portions of the patient's history were reviewed and updated as appropriate: allergies, current medications,  past family history, past medical history,  past surgical history, past social history  and problem list.  Visual acuity was not assessed per patient preference since she has regular follow up with her ophthalmologist. Hearing and body mass index were assessed and reviewed.   During the course of the visit the patient was educated and counseled about appropriate screening and preventive services including : fall prevention , diabetes screening, nutrition counseling, colorectal cancer screening, and recommended immunizations.    CC: The primary encounter diagnosis was Elevated CA-125. Diagnoses of Essential hypertension, benign, Constipation, unspecified constipation type, and Encounter for Medicare annual wellness exam were also pertinent to this visit.  History Caitlin Mason has a past medical history of Cholelithiasis; Osteoporosis; Plantar fasciitis; Actinic keratosis; Diverticulosis; HLD (hyperlipidemia); HTN (hypertension); Depression; Low back pain; Cystoid macular degeneration of right eye (Noc 2012); COPD (chronic obstructive pulmonary disease); and Cataract.   She recently underwent evaluation for persistent bloating and abdominal distension with a CA 125 and CT scan.  The CA 125 was very elevated , but the CT scan did not reveal andy adnexal masses.  The implications and limitations of both tests were discussed with patient today. She has past surgical history that includes Colonoscopy; Vesicovaginal fistula closure w/ TAH; Abdominal hysterectomy; and nm myoview ltd (Oct 2011).   Her family history is not on file.She reports that she quit smoking about 29 years ago. She has never used smokeless tobacco. She reports that she does not drink alcohol or use illicit drugs.  Outpatient Prescriptions Prior to Visit  Medication Sig Dispense Refill  .  albuterol (PROAIR HFA) 108 (90 BASE) MCG/ACT inhaler Inhale 2 puffs into the lungs every 6 (six) hours as needed for wheezing or shortness of breath. 6.7  g 3  . ALPRAZolam (XANAX) 0.5 MG tablet Take 1 tablet (0.5 mg total) by mouth at bedtime as needed for sleep. 30 tablet 5  . ALPRAZolam (XANAX) 1 MG tablet TAKE 1/2 TABLET BY MOUTH AT BEDTIME AS NEEDED FOR SLEEP 30 tablet 4  . aspirin 81 MG EC tablet Take 81 mg by mouth daily.      . Cholecalciferol (VITAMIN D3) 400 UNITS tablet Take 1,000 Units by mouth daily.     . enalapril (VASOTEC) 5 MG tablet TAKE 1 TABLET BY MOUTH EACH DAY 30 tablet 5  . fexofenadine (ALLEGRA) 180 MG tablet Take 180 mg by mouth daily.      . Lactulose 20 GM/30ML SOLN 30 ml every 4 hours until constipation is relieved 236 mL 3  . omeprazole (PRILOSEC) 40 MG capsule TAKE ONE (1) CAPSULE EACH DAY 30 capsule 5  . simvastatin (ZOCOR) 40 MG tablet TAKE 1 TABLET EVERY EVENING FOR CHOLESTEROL 30 tablet 6  . traMADol (ULTRAM) 50 MG tablet Take 1 tablet (50 mg total) by mouth every 8 (eight) hours as needed. 90 tablet 0   No facility-administered medications prior to visit.    Review of Systems   Patient denies headache, fevers, malaise, unintentional weight loss, skin rash, eye pain, sinus congestion and sinus pain, sore throat, dysphagia,  hemoptysis , cough, dyspnea, wheezing, chest pain, palpitations, orthopnea, edema, abdominal pain, nausea, melena, diarrhea, constipation, flank pain, dysuria, hematuria, urinary  Frequency, nocturia, numbness, tingling, seizures,  Focal weakness, Loss of consciousness,  Tremor, insomnia, depression, anxiety, and suicidal ideation.      Objective:  BP 136/60 mmHg  Pulse 84  Temp(Src) 98.6 F (37 C) (Oral)  Resp 16  Ht 4\' 11"  (1.499 m)  Wt 128 lb 8 oz (58.287 kg)  BMI 25.94 kg/m2  SpO2 96%  Physical Exam   General appearance: alert, cooperative and appears stated age Head: Normocephalic, without obvious abnormality, atraumatic Eyes: conjunctivae/corneas clear. PERRL, EOM's intact. Fundi benign. Ears: normal TM's and external ear canals both ears Nose: Nares normal. Septum  midline. Mucosa normal. No drainage or sinus tenderness. Throat: lips, mucosa, and tongue normal; teeth and gums normal Neck: no adenopathy, no carotid bruit, no JVD, supple, symmetrical, trachea midline and thyroid not enlarged, symmetric, no tenderness/mass/nodules Lungs: clear to auscultation bilaterally Breasts: normal appearance, no masses or tenderness Heart: regular rate and rhythm, S1, S2 normal, no murmur, click, rub or gallop Abdomen: soft, non-tender; bowel sounds normal; no masses,  no organomegaly Extremities: extremities normal, atraumatic, no cyanosis or edema Pulses: 2+ and symmetric Skin: Skin color, texture, turgor normal. No rashes or lesions Neurologic: Alert and oriented X 3, normal strength and tone. Normal symmetric reflexes. Normal coordination and gait.    Assessment & Plan:   Problem List Items Addressed This Visit    Encounter for Medicare annual wellness exam    Annual Medicare wellness  exam was done as well as a comprehensive physical exam and management of acute and chronic conditions .  During the course of the visit the patient was educated and counseled about appropriate screening and preventive services including : fall prevention , diabetes screening, nutrition counseling, colorectal cancer screening, and recommended immunizations.  Printed recommendations for health maintenance screenings was given.       Essential hypertension, benign    Well controlled on  current regimen. Renal function stable, no changes today.      Constipation    Resolved with regular se of stool softener       Elevated CA-125 - Primary    Her CT scan failed to show an adnexal mass, but given the elevation of the CA 125 I am concerned that she has an occult mass.  PET scan has been ordered.       Relevant Orders   NM PET Mets (NOPR) Whole Body (NaF)      I am having Ms. Quentin Ore maintain her aspirin, Vitamin D3, fexofenadine, ALPRAZolam, albuterol, traMADol, ALPRAZolam,  enalapril, omeprazole, simvastatin, and Lactulose.  No orders of the defined types were placed in this encounter.    There are no discontinued medications.  Follow-up: Return in about 3 months (around 08/20/2015).   Crecencio Mc, MD

## 2015-05-27 ENCOUNTER — Telehealth: Payer: Self-pay | Admitting: Internal Medicine

## 2015-05-27 NOTE — Telephone Encounter (Signed)
Her insurance will not pay for a PET scan since the CT scan was normal .  They do not feel that the blood test is accurate enough to go looking for cancer when there are no other signs or symptoms.  After further research, I am inclined to agree so we will not be pursuing a PET scan

## 2015-05-27 NOTE — Telephone Encounter (Signed)
Advised patient after MD researched the results of the Ca 125 that this information was not enough to warrant further evaluation with a PET scan at this time patient was relieved and very thankful. Patient re-iterated that she was so relieved and that she was feeling fine. Advised by patient to advised MD of many thanks. FYI

## 2015-05-29 ENCOUNTER — Telehealth: Payer: Self-pay | Admitting: Internal Medicine

## 2015-05-29 DIAGNOSIS — R971 Elevated cancer antigen 125 [CA 125]: Secondary | ICD-10-CM

## 2015-05-29 NOTE — Telephone Encounter (Signed)
Absolutely,  Referral is in process as requested.  I sent Dr Oliva Bustard a note,

## 2015-05-29 NOTE — Telephone Encounter (Signed)
Please advise. Patient stated that since her family history is so strong for cancer she would just like to consult with Dr. Dione Plover no reflection on MD just will ease her mind.

## 2015-05-29 NOTE — Telephone Encounter (Signed)
Pt decided that she does want the consult with Dr. Dione Plover about the CT CA125. Please advise

## 2015-05-29 NOTE — Telephone Encounter (Signed)
Patient notified and voiced understanding.

## 2015-06-08 ENCOUNTER — Inpatient Hospital Stay: Payer: Medicare Other | Attending: Oncology | Admitting: Oncology

## 2015-06-08 VITALS — BP 179/90 | HR 68 | Temp 96.9°F | Wt 125.7 lb

## 2015-06-08 DIAGNOSIS — Z7982 Long term (current) use of aspirin: Secondary | ICD-10-CM | POA: Diagnosis not present

## 2015-06-08 DIAGNOSIS — M545 Low back pain: Secondary | ICD-10-CM

## 2015-06-08 DIAGNOSIS — Z79899 Other long term (current) drug therapy: Secondary | ICD-10-CM | POA: Insufficient documentation

## 2015-06-08 DIAGNOSIS — J449 Chronic obstructive pulmonary disease, unspecified: Secondary | ICD-10-CM | POA: Insufficient documentation

## 2015-06-08 DIAGNOSIS — R630 Anorexia: Secondary | ICD-10-CM | POA: Insufficient documentation

## 2015-06-08 DIAGNOSIS — I10 Essential (primary) hypertension: Secondary | ICD-10-CM | POA: Insufficient documentation

## 2015-06-08 DIAGNOSIS — E785 Hyperlipidemia, unspecified: Secondary | ICD-10-CM | POA: Diagnosis not present

## 2015-06-08 DIAGNOSIS — R971 Elevated cancer antigen 125 [CA 125]: Secondary | ICD-10-CM

## 2015-06-08 DIAGNOSIS — K5790 Diverticulosis of intestine, part unspecified, without perforation or abscess without bleeding: Secondary | ICD-10-CM | POA: Diagnosis not present

## 2015-06-08 NOTE — Progress Notes (Signed)
Patient here today as referral of Dr. Derrel Nip.  Pt has history. Of diverticulitis.  CT scan showed diverticulosis. CA 125 elevated.  Pt has no appetite. Patient complains of pain in her left sacral area when sitting.  States she has to position herself to alleviate pain.

## 2015-06-14 ENCOUNTER — Encounter: Payer: Self-pay | Admitting: Oncology

## 2015-06-14 NOTE — Progress Notes (Signed)
Caitlin Mason @ [facility] Telephone:(336) [phone]  Fax:(336) [place]: [birth year]  MR#: [id number]  MBT#:[id number]  Patient Care Team: Crecencio Mc, MD as PCP - General (Internal Medicine)  CHIEF COMPLAINT:  Chief Complaint  Patient presents with  . New Evaluation    VISIT DIAGNOSIS:     ICD-9-CM ICD-10-CM   1. Elevated CA-125 795.82 R97.1 CA 125      No history exists.    Oncology Flowsheet 08/16/2012 09/07/2012  methylPREDNISolone acetate (DEPO-MEDROL) IM 40 mg 40 mg    INTERVAL HISTORY: 80 year old lady was referred to me for further evaluation regarding high CA-125.  Patient started having abdominal discomfort.  Pain was localized in the left lower quadrant.  CA-125 was documented to be 90.  Patient underwent abdominal CT scan which revealed diverticulosis.  Left lower quadrant abdominal pain is improved but low back pain continues.  Patient is extremely anxious.  Head hysterectomy and ovaries were not removed.  Patient is getting regular mammograms done also had a colonoscopy.  Does not report any significant weight loss.  No rectal bleeding.  REVIEW OF SYSTEMS:   GENERAL:  Feels good.  Active.  No fevers, sweats or weight loss. PERFORMANCE STATUS (ECOG):0 HEENT:  No visual changes, runny nose, sore throat, mouth sores or tenderness. Lungs: No shortness of breath or cough.  No hemoptysis. Cardiac:  No chest pain, palpitations, orthopnea, or PND. GI:  No nausea, vomiting, diarrhea, constipation, melena or hematochezia. GU:  No urgency, frequency, dysuria, or hematuria. Musculoskeletal:  No back pain.  No joint pain.  No muscle tenderness. Extremities:  No pain or swelling. Skin:  No rashes or skin changes. Neuro:  No headache, numbness or weakness, balance or coordination issues. Endocrine:  No diabetes, thyroid issues, hot flashes or night sweats. Psych:  No mood changes, depression or anxiety. Pain:  As mentioned above. Review  of systems:  All other systems reviewed and found to be negative.  As per HPI. Otherwise, a complete review of systems is negatve.  PAST MEDICAL HISTORY: Past Medical History  Diagnosis Date  . Cholelithiasis     asymptomatic  . Osteoporosis   . Plantar fasciitis     Morton's Neuroma  . Actinic keratosis     back, upper extremities: s/p liquid nitro 01/29/06  . Diverticulosis   . HLD (hyperlipidemia)   . HTN (hypertension)   . Depression   . Low back pain   . 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 COPD (chronic obstructive pulmonary disease)   . Cataract     s/p extraction 2007, 2005    PAST SURGICAL HISTORY: Past Surgical History  Procedure Laterality Date  . Colonoscopy      3/09, one sigmoid polyp; no tics   . Vesicovaginal fistula closure w/ tah    . Abdominal hysterectomy    . Nm myoview ltd  Oct 2011    normal, EF 89%, study limited by lung disease    FAMILY HISTORY No family history on file.  GYNECOLOGIC HISTORY:  No LMP recorded. Patient has had a hysterectomy.     ADVANCED DIRECTIVES: Patient does not have any advanced healthcare directive. Information has been given.   HEALTH MAINTENANCE: History  Substance Use Topics  . Smoking status: Former Smoker    Quit date: 08/15/1985  . Smokeless tobacco: Never Used     Comment: quit 20 years ago   . Alcohol  Use: No      Allergies  Allergen Reactions  . Codeine     REACTION: giup set  . Demerol Nausea And Vomiting  . Hctz [Hydrochlorothiazide] Other (See Comments)    Hyponatremia   . Levaquin [Levofloxacin In D5w] Nausea And Vomiting  . Propoxyphene N-Acetaminophen     REACTION: gi upset  . Sulfonamide Derivatives     REACTION: gi upset    Current Outpatient Prescriptions  Medication Sig Dispense Refill  . albuterol (PROAIR HFA) 108 (90 BASE) MCG/ACT inhaler Inhale 2 puffs into the lungs every 6 (six) hours as needed for wheezing or shortness of  breath. 6.7 g 3  . ALPRAZolam (XANAX) 0.5 MG tablet Take 1 tablet (0.5 mg total) by mouth at bedtime as needed for sleep. 30 tablet 5  . aspirin 81 MG EC tablet Take 81 mg by mouth daily.      . Cholecalciferol (VITAMIN D3) 400 UNITS tablet Take 1,000 Units by mouth daily.     . enalapril (VASOTEC) 5 MG tablet TAKE 1 TABLET BY MOUTH EACH DAY 30 tablet 5  . fexofenadine (ALLEGRA) 180 MG tablet Take 180 mg by mouth daily.      Marland Kitchen omeprazole (PRILOSEC) 40 MG capsule TAKE ONE (1) CAPSULE EACH DAY 30 capsule 5  . simvastatin (ZOCOR) 40 MG tablet TAKE 1 TABLET EVERY EVENING FOR CHOLESTEROL 30 tablet 6  . ALPRAZolam (XANAX) 1 MG tablet TAKE 1/2 TABLET BY MOUTH AT BEDTIME AS NEEDED FOR SLEEP (Patient not taking: Reported on 06/08/2015) 30 tablet 4  . Lactulose 20 GM/30ML SOLN 30 ml every 4 hours until constipation is relieved (Patient not taking: Reported on 06/08/2015) 236 mL 3  . traMADol (ULTRAM) 50 MG tablet Take 1 tablet (50 mg total) by mouth every 8 (eight) hours as needed. (Patient not taking: Reported on 06/08/2015) 90 tablet 0   No current facility-administered medications for this visit.    OBJECTIVE: PHYSICAL EXAM:\ GENERAL:  Well developed, well nourished, sitting comfortably in the exam room in no acute distress. MENTAL STATUS:  Alert and oriented to person, place and time. HEAD:  Normocephalic, atraumatic, face symmetric, no Cushingoid features. EYES:  .  Pupils equal round and reactive to light and accomodation.  No conjunctivitis or scleral icterus. ENT:  Oropharynx clear without lesion.  Tongue normal. Mucous membranes moist.  RESPIRATORY:  Clear to auscultation without rales, wheezes or rhonchi. CARDIOVASCULAR:  Regular rate and rhythm without murmur, rub or gallop. BREAST:  Right breast without masses, skin changes or nipple discharge.  Left breast without masses, skin changes or nipple discharge. ABDOMEN:  Soft, non-tender, with active bowel sounds, and no hepatosplenomegaly.  No  masses. BACK:  No CVA tenderness.  No tenderness on percussion of the back or rib cage. SKIN:  No rashes, ulcers or lesions. EXTREMITIES: No edema, no skin discoloration or tenderness.  No palpable cords. LYMPH NODES: No palpable cervical, supraclavicular, axillary or inguinal adenopathy  NEUROLOGICAL: Unremarkable. PSYCH:  Appropriate.  Filed Vitals:   06/08/15 1533  BP: 179/90  Pulse: 68  Temp: 96.9 F (36.1 C)     Body mass index is 25.37 kg/(m^2).    ECOG FS:0 - Asymptomatic  LAB RESULTS:  No visits with results within 2 Day(s) from this visit. Latest known visit with results is:  Lab on 05/19/2015  Component Date Value Ref Range Status  . Cholesterol 05/19/2015 206* 0 - 200 mg/dL Final   ATP III Classification       Desirable:  <  200 mg/dL               Borderline High:  200 - 239 mg/dL          High:  > = 240 mg/dL  . Triglycerides 05/19/2015 127.0  0.0 - 149.0 mg/dL Final   Normal:  <150 mg/dLBorderline High:  150 - 199 mg/dL  . HDL 05/19/2015 64.70  >39.00 mg/dL Final  . VLDL 05/19/2015 25.4  0.0 - 40.0 mg/dL Final  . LDL Cholesterol 05/19/2015 116* 0 - 99 mg/dL Final  . Total CHOL/HDL Ratio 05/19/2015 3   Final                  Men          Women1/2 Average Risk     3.4          3.3Average Risk          5.0          4.42X Average Risk          9.6          7.13X Average Risk          15.0          11.0                      . NonHDL 05/19/2015 141.30   Final   NOTE:  Non-HDL goal should be 30 mg/dL higher than patient's LDL goal (i.e. LDL goal of < 70 mg/dL, would have non-HDL goal of < 100 mg/dL)  . Sodium 05/19/2015 137  135 - 145 mEq/L Final  . Potassium 05/19/2015 4.5  3.5 - 5.1 mEq/L Final  . Chloride 05/19/2015 101  96 - 112 mEq/L Final  . CO2 05/19/2015 32  19 - 32 mEq/L Final  . Glucose, Bld 05/19/2015 89  70 - 99 mg/dL Final  . BUN 05/19/2015 17  6 - 23 mg/dL Final  . Creatinine, Ser 05/19/2015 1.08  0.40 - 1.20 mg/dL Final  . Total Bilirubin 05/19/2015 0.4   0.2 - 1.2 mg/dL Final  . Alkaline Phosphatase 05/19/2015 61  39 - 117 U/L Final  . AST 05/19/2015 20  0 - 37 U/L Final  . ALT 05/19/2015 9  0 - 35 U/L Final  . Total Protein 05/19/2015 6.8  6.0 - 8.3 g/dL Final  . Albumin 05/19/2015 4.1  3.5 - 5.2 g/dL Final  . Calcium 05/19/2015 9.9  8.4 - 10.5 mg/dL Final  . GFR 05/19/2015 52.10* >60.00 mL/min Final      ASSESSMENT:  Elevated CA-125 CT scan of abdomen revealed no evidence of ovarian mass or any other abnormality except for diverticulosis  PLAN:   Long discussion with patient that elevated CA-125 does not necessarily means that patient is dealing with cancer. Possibility of any intraperitoneal inflammation can give rise to high CA-125. Unless CA-125 is rising no further evaluation may be needed at present time.  We will repeat CA-125 in 3 weeks and if it continues to go up I will obtain opinion from GYN oncologist.  CA-125 has been scheduled in 3 weeks    Patient expressed understanding and was in agreement with this plan. She also understands that She can call clinic at any time with any questions, concerns, or complaints.    No matching staging information was found for the patient.  Forest Gleason, MD   06/14/2015 8:40 AM

## 2015-06-26 ENCOUNTER — Other Ambulatory Visit: Payer: Self-pay | Admitting: Internal Medicine

## 2015-06-26 NOTE — Telephone Encounter (Signed)
Last OV 6.1.16.  Please advise refill 

## 2015-06-26 NOTE — Telephone Encounter (Signed)
Rx faxed

## 2015-06-26 NOTE — Telephone Encounter (Signed)
Ok to refill,  printed rx  

## 2015-06-29 ENCOUNTER — Inpatient Hospital Stay: Payer: Medicare Other | Attending: Oncology

## 2015-06-29 ENCOUNTER — Other Ambulatory Visit: Payer: Self-pay | Admitting: Internal Medicine

## 2015-06-29 DIAGNOSIS — R634 Abnormal weight loss: Secondary | ICD-10-CM | POA: Diagnosis not present

## 2015-06-29 DIAGNOSIS — F419 Anxiety disorder, unspecified: Secondary | ICD-10-CM | POA: Diagnosis not present

## 2015-06-29 DIAGNOSIS — Z7982 Long term (current) use of aspirin: Secondary | ICD-10-CM | POA: Diagnosis not present

## 2015-06-29 DIAGNOSIS — N6012 Diffuse cystic mastopathy of left breast: Secondary | ICD-10-CM | POA: Insufficient documentation

## 2015-06-29 DIAGNOSIS — R630 Anorexia: Secondary | ICD-10-CM | POA: Diagnosis not present

## 2015-06-29 DIAGNOSIS — R1032 Left lower quadrant pain: Secondary | ICD-10-CM | POA: Diagnosis not present

## 2015-06-29 DIAGNOSIS — I10 Essential (primary) hypertension: Secondary | ICD-10-CM | POA: Diagnosis not present

## 2015-06-29 DIAGNOSIS — Z90710 Acquired absence of both cervix and uterus: Secondary | ICD-10-CM | POA: Diagnosis not present

## 2015-06-29 DIAGNOSIS — M545 Low back pain: Secondary | ICD-10-CM | POA: Diagnosis not present

## 2015-06-29 DIAGNOSIS — E785 Hyperlipidemia, unspecified: Secondary | ICD-10-CM | POA: Diagnosis not present

## 2015-06-29 DIAGNOSIS — Z87891 Personal history of nicotine dependence: Secondary | ICD-10-CM | POA: Insufficient documentation

## 2015-06-29 DIAGNOSIS — R971 Elevated cancer antigen 125 [CA 125]: Secondary | ICD-10-CM | POA: Insufficient documentation

## 2015-06-29 DIAGNOSIS — J449 Chronic obstructive pulmonary disease, unspecified: Secondary | ICD-10-CM | POA: Diagnosis not present

## 2015-06-29 DIAGNOSIS — R140 Abdominal distension (gaseous): Secondary | ICD-10-CM | POA: Diagnosis not present

## 2015-06-29 DIAGNOSIS — Z79899 Other long term (current) drug therapy: Secondary | ICD-10-CM | POA: Insufficient documentation

## 2015-06-29 DIAGNOSIS — Z803 Family history of malignant neoplasm of breast: Secondary | ICD-10-CM | POA: Diagnosis not present

## 2015-06-29 DIAGNOSIS — K5790 Diverticulosis of intestine, part unspecified, without perforation or abscess without bleeding: Secondary | ICD-10-CM | POA: Diagnosis not present

## 2015-06-30 LAB — CA 125: CA 125: 120.1 U/mL — ABNORMAL HIGH (ref 0.0–38.1)

## 2015-07-06 ENCOUNTER — Inpatient Hospital Stay (HOSPITAL_BASED_OUTPATIENT_CLINIC_OR_DEPARTMENT_OTHER): Payer: Medicare Other | Admitting: Oncology

## 2015-07-06 ENCOUNTER — Encounter: Payer: Self-pay | Admitting: Oncology

## 2015-07-06 VITALS — BP 149/82 | HR 82 | Temp 96.0°F | Resp 18 | Ht 60.0 in | Wt 124.3 lb

## 2015-07-06 DIAGNOSIS — R971 Elevated cancer antigen 125 [CA 125]: Secondary | ICD-10-CM

## 2015-07-06 DIAGNOSIS — M545 Low back pain: Secondary | ICD-10-CM | POA: Diagnosis not present

## 2015-07-06 DIAGNOSIS — R1032 Left lower quadrant pain: Secondary | ICD-10-CM | POA: Diagnosis not present

## 2015-07-06 DIAGNOSIS — I10 Essential (primary) hypertension: Secondary | ICD-10-CM

## 2015-07-06 DIAGNOSIS — Z90710 Acquired absence of both cervix and uterus: Secondary | ICD-10-CM

## 2015-07-06 DIAGNOSIS — F419 Anxiety disorder, unspecified: Secondary | ICD-10-CM

## 2015-07-06 DIAGNOSIS — Z87891 Personal history of nicotine dependence: Secondary | ICD-10-CM

## 2015-07-06 DIAGNOSIS — K5790 Diverticulosis of intestine, part unspecified, without perforation or abscess without bleeding: Secondary | ICD-10-CM | POA: Diagnosis not present

## 2015-07-06 DIAGNOSIS — E785 Hyperlipidemia, unspecified: Secondary | ICD-10-CM

## 2015-07-06 DIAGNOSIS — J449 Chronic obstructive pulmonary disease, unspecified: Secondary | ICD-10-CM

## 2015-07-06 NOTE — Progress Notes (Signed)
Patient is a former smoker x25 years. Quit in 1986 Patient has a living will

## 2015-07-10 ENCOUNTER — Ambulatory Visit
Admission: RE | Admit: 2015-07-10 | Discharge: 2015-07-10 | Disposition: A | Discharge status: Home / Self Care | Payer: Medicare Other | Source: Ambulatory Visit | Attending: Oncology | Admitting: Oncology

## 2015-07-10 DIAGNOSIS — N959 Unspecified menopausal and perimenopausal disorder: Secondary | ICD-10-CM | POA: Diagnosis not present

## 2015-07-10 DIAGNOSIS — R971 Elevated cancer antigen 125 [CA 125]: Secondary | ICD-10-CM | POA: Diagnosis not present

## 2015-07-12 ENCOUNTER — Encounter: Payer: Self-pay | Admitting: Oncology

## 2015-07-12 NOTE — Progress Notes (Signed)
Caitlin Mason @ [facility] Telephone:(336) [phone]  Fax:(336) [place]: 03-31-1935  MR#: [id number]  UXL#:[id number]  Patient Care Team: Crecencio Mc, MD as PCP - General (Internal Medicine)  CHIEF COMPLAINT:  Chief Complaint  Patient presents with  . Follow-up    Elevated CA-125    VISIT DIAGNOSIS:     ICD-9-CM ICD-10-CM   1. Elevated CA-125 795.82 R97.1 US Transvaginal Non-OB     US Pelvis Complete      No history exists.    Oncology Flowsheet 08/16/2012 09/07/2012  methylPREDNISolone acetate (DEPO-MEDROL) IM 40 mg 40 mg    INTERVAL HISTORY: 80 year old lady was referred to me for further evaluation regarding high CA-125.  Patient started having abdominal discomfort.  Pain was localized in the left lower quadrant.  CA-125 was documented to be 90.  Patient underwent abdominal CT scan which revealed diverticulosis.  Left lower quadrant abdominal pain is improved but low back pain continues.  Patient is extremely anxious.  Head hysterectomy and ovaries were not removed.  Patient is getting regular mammograms done also had a colonoscopy.  Does not report any significant weight loss.  No rectal bleeding.  July, 2016 Patient is here with a rising CA-125.  CA-125 has increased from 90    To  120 patient remains extremely apprehensive Remains asymptomatic REVIEW OF SYSTEMS:   GENERAL:  Feels good.  Active.  No fevers, sweats or weight loss. PERFORMANCE STATUS (ECOG):0 HEENT:  No visual changes, runny nose, sore throat, mouth sores or tenderness. Lungs: No shortness of breath or cough.  No hemoptysis. Cardiac:  No chest pain, palpitations, orthopnea, or PND. GI:  No nausea, vomiting, diarrhea, constipation, melena or hematochezia. GU:  No urgency, frequency, dysuria, or hematuria. Musculoskeletal:  No back pain.  No joint pain.  No muscle tenderness. Extremities:  No pain or swelling. Skin:  No rashes or skin changes. Neuro:  No headache,  numbness or weakness, balance or coordination issues. Endocrine:  No diabetes, thyroid issues, hot flashes or night sweats. Psych:  No mood changes, depression or anxiety. Pain:  As mentioned above. Review of systems:  All other systems reviewed and found to be negative.  As per HPI. Otherwise, a complete review of systems is negatve.  PAST MEDICAL HISTORY: Past Medical History  Diagnosis Date  . Cholelithiasis     asymptomatic  . Osteoporosis   . Plantar fasciitis     Morton's Neuroma  . Actinic keratosis     back, upper extremities: s/p liquid nitro 01/29/06  . Diverticulosis   . HLD (hyperlipidemia)   . HTN (hypertension)   . Depression   . Low back pain   . 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 COPD (chronic obstructive pulmonary disease)   . Cataract     s/p extraction 2007, 2005    PAST SURGICAL HISTORY: Past Surgical History  Procedure Laterality Date  . Colonoscopy      3/09, one sigmoid polyp; no tics   . Vesicovaginal fistula closure w/ tah    . Abdominal hysterectomy    . Nm myoview ltd  Oct 2011    normal, EF 89%, study limited by lung disease    FAMILY HISTORY Family History  Problem Relation Age of Onset  . Breast cancer Mother   . Lung cancer Maternal Uncle   . Gallbladder disease Maternal Aunt   . Aneurysm Father  GYNECOLOGIC HISTORY:  No LMP recorded. Patient has had a hysterectomy.     ADVANCED DIRECTIVES: Patient does not have any advanced healthcare directive. Information has been given.   HEALTH MAINTENANCE: History  Substance Use Topics  . Smoking status: Former Smoker -- 1.00 packs/day for 25 years    Types: Cigarettes    Quit date: 08/15/1985  . Smokeless tobacco: Never Used     Comment: quit 20 years ago   . Alcohol Use: No      Allergies  Allergen Reactions  . Codeine     REACTION: giup set  . Demerol Nausea And Vomiting  . Hctz [Hydrochlorothiazide] Other (See Comments)     Hyponatremia   . Levaquin [Levofloxacin In D5w] Nausea And Vomiting  . Propoxyphene N-Acetaminophen     REACTION: gi upset  . Sulfonamide Derivatives     REACTION: gi upset    Current Outpatient Prescriptions  Medication Sig Dispense Refill  . albuterol (PROAIR HFA) 108 (90 BASE) MCG/ACT inhaler Inhale 2 puffs into the lungs every 6 (six) hours as needed for wheezing or shortness of breath. 6.7 g 3  . ALPRAZolam (XANAX) 0.5 MG tablet Take 1 tablet (0.5 mg total) by mouth at bedtime as needed for anxiety. 30 tablet 5  . aspirin 81 MG EC tablet Take 81 mg by mouth daily.      . Cholecalciferol (VITAMIN D3) 400 UNITS tablet Take 1,000 Units by mouth daily.     . enalapril (VASOTEC) 5 MG tablet TAKE 1 TABLET BY MOUTH EACH DAY 30 tablet 6  . fexofenadine (ALLEGRA) 180 MG tablet Take 180 mg by mouth daily.      . Lactulose 20 GM/30ML SOLN 30 ml every 4 hours until constipation is relieved 236 mL 3  . omeprazole (PRILOSEC) 40 MG capsule TAKE ONE (1) CAPSULE EACH DAY 30 capsule 6  . simvastatin (ZOCOR) 40 MG tablet TAKE 1 TABLET EVERY EVENING FOR CHOLESTEROL 30 tablet 6  . traMADol (ULTRAM) 50 MG tablet Take 1 tablet (50 mg total) by mouth every 8 (eight) hours as needed. (Patient not taking: Reported on 07/06/2015) 90 tablet 0   No current facility-administered medications for this visit.    OBJECTIVE: PHYSICAL EXAM:\ GENERAL:  Well developed, well nourished, sitting comfortably in the exam room in no acute distress. MENTAL STATUS:  Alert and oriented to person, place and time. HEAD:  Normocephalic, atraumatic, face symmetric, no Cushingoid features. EYES:  .  Pupils equal round and reactive to light and accomodation.  No conjunctivitis or scleral icterus. ENT:  Oropharynx clear without lesion.  Tongue normal. Mucous membranes moist.  RESPIRATORY:  Clear to auscultation without rales, wheezes or rhonchi. CARDIOVASCULAR:  Regular rate and rhythm without murmur, rub or gallop. BREAST:  Right  breast without masses, skin changes or nipple discharge.  Left breast without masses, skin changes or nipple discharge. ABDOMEN:  Soft, non-tender, with active bowel sounds, and no hepatosplenomegaly.  No masses. BACK:  No CVA tenderness.  No tenderness on percussion of the back or rib cage. SKIN:  No rashes, ulcers or lesions. EXTREMITIES: No edema, no skin discoloration or tenderness.  No palpable cords. LYMPH NODES: No palpable cervical, supraclavicular, axillary or inguinal adenopathy  NEUROLOGICAL: Unremarkable. PSYCH:  Appropriate.  Filed Vitals:   07/06/15 1350  BP: 149/82  Pulse: 82  Temp: 96 F (35.6 C)  Resp: 18     Body mass index is 24.28 kg/(m^2).    ECOG FS:0 - Asymptomatic  LAB RESULTS:  No visits with results within 2 Day(s) from this visit. Latest known visit with results is:  Appointment on 06/29/2015  Component Date Value Ref Range Status  . CA 125 06/29/2015 120.1* 0.0 - 38.1 U/mL Final   Comment: (NOTE) [employer] ECLIA methodology Performed At: [facility] [place], [place] [id number] Lindon Romp MD XN:[id number]       ASSESSMENT:  Elevated CA-125 CT scan of abdomen revealed no evidence of ovarian mass or any other abnormality except for diverticulosis  PLAN:   Long discussion with patient that elevated CA-125 does not necessarily means that patient is dealing with cancer. Possibility of any intraperitoneal inflammation can give rise to high CA-125. Again I had a prolonged discussion with patient about benign causes of rising CA-125.  Patient remains very apprehensive.  I have set up an appointment for gynecologist oncologist for review and evaluation   Patient expressed understanding and was in agreement with this plan. She also understands that She can call clinic at any time with any questions, concerns, or complaints.    No matching staging information was found for the patient.  Forest Gleason, MD   07/12/2015 9:15  AM

## 2015-07-15 ENCOUNTER — Ambulatory Visit: Payer: Medicare Other

## 2015-07-15 ENCOUNTER — Other Ambulatory Visit: Payer: Self-pay | Admitting: *Deleted

## 2015-07-15 ENCOUNTER — Inpatient Hospital Stay (HOSPITAL_BASED_OUTPATIENT_CLINIC_OR_DEPARTMENT_OTHER): Payer: Medicare Other | Admitting: Obstetrics and Gynecology

## 2015-07-15 ENCOUNTER — Encounter: Payer: Self-pay | Admitting: *Deleted

## 2015-07-15 VITALS — BP 166/92 | HR 83 | Temp 97.8°F | Resp 18 | Wt 122.6 lb

## 2015-07-15 DIAGNOSIS — R978 Other abnormal tumor markers: Secondary | ICD-10-CM | POA: Insufficient documentation

## 2015-07-15 DIAGNOSIS — N6320 Unspecified lump in the left breast, unspecified quadrant: Secondary | ICD-10-CM

## 2015-07-15 DIAGNOSIS — I10 Essential (primary) hypertension: Secondary | ICD-10-CM

## 2015-07-15 DIAGNOSIS — Z79899 Other long term (current) drug therapy: Secondary | ICD-10-CM

## 2015-07-15 DIAGNOSIS — K5790 Diverticulosis of intestine, part unspecified, without perforation or abscess without bleeding: Secondary | ICD-10-CM

## 2015-07-15 DIAGNOSIS — R634 Abnormal weight loss: Secondary | ICD-10-CM

## 2015-07-15 DIAGNOSIS — R140 Abdominal distension (gaseous): Secondary | ICD-10-CM

## 2015-07-15 DIAGNOSIS — N6012 Diffuse cystic mastopathy of left breast: Secondary | ICD-10-CM

## 2015-07-15 DIAGNOSIS — R630 Anorexia: Secondary | ICD-10-CM | POA: Diagnosis not present

## 2015-07-15 DIAGNOSIS — R971 Elevated cancer antigen 125 [CA 125]: Secondary | ICD-10-CM

## 2015-07-15 DIAGNOSIS — E785 Hyperlipidemia, unspecified: Secondary | ICD-10-CM

## 2015-07-15 DIAGNOSIS — F419 Anxiety disorder, unspecified: Secondary | ICD-10-CM

## 2015-07-15 DIAGNOSIS — Z803 Family history of malignant neoplasm of breast: Secondary | ICD-10-CM

## 2015-07-15 DIAGNOSIS — J449 Chronic obstructive pulmonary disease, unspecified: Secondary | ICD-10-CM

## 2015-07-15 NOTE — Progress Notes (Signed)
Gynecologic Oncology Consult Visit   Referring Provider: Dr. Forest Gleason  Primary Care Physician:  Deborra Medina, MD  Chief Concern: Elevated CA125  Subjective:  Caitlin Mason is a 80 y.o. female G1P1 s/p TAH for benign disease (bilateral ovaries in situ) who is seen in consultation from Dr. Oliva Bustard for evaluation regarding high CA-125 that was obtained history of persistent bloating and abdominal distension. Patient underwent abdominal CT scan which revealed diverticulosis as noted below. She has a h/o diverticulitis and multiple flares but feels like her last flare was at least a year ago. CA125 values have increased over the last 3 months. However, her symptoms have improved. Yet she has lost 8 over 3-4 months and has a decreased appetite.  Lab Results  Component Value Date   CA125 120.1* 06/29/2015   CA125 90* 04/13/2015       CT scan abdomen and pelvis 04/22/2015 IMPRESSION: 1. No abdominal or pelvic mass or adenopathy is seen. No fluid is noted in the pelvis. 2. Multiple rectosigmoid colon diverticula. No diverticulitis. 3. Anterolisthesis of L4 on L5 by a proximal 5 mm most likely due to degenerative change involving the facet joints.  Transabdominal/Transvaginal US 07/10/2015 IMPRESSION: Neither ovary is visualized, likely due to atrophy from postmenopausal state and overlying gas. Uterus absent. No pelvic mass or fluid identified. No inflammatory focus appreciated. If there remains concern for potential ovarian pathology given the elevated CA 125 value, pelvic MR would be the imaging study of choice for further assessment.  Patient is getting regular mammograms  Last 12/2014 BI-RADS 1 negative. Colonoscopy 2014 was negative per patient.  PET was ordered but denied by insurance.   Of note when I asked her about the vesicovaginal fistula she did not recall ever having this diagnosis. Her hysterectomy was performed for abnormal vaginal bleeding.   Problem  List: Patient Active Problem List   Diagnosis Date Noted  . Elevated tumor markers 07/15/2015  . Elevated CA-125 04/15/2015  . Constipation 04/13/2015  . Diverticulosis of colon without hemorrhage 04/13/2015  . Leg pain, left 01/04/2015  . Hyponatremia 05/18/2014  . Essential hypertension, benign 05/18/2014  . Sciatica of right side 05/16/2014  . Encounter for Medicare annual wellness exam 05/14/2013  . Sciatica of left side 09/09/2012  . Lumbago 08/18/2012  . Cataract   . COPD (chronic obstructive pulmonary disease)   . Cystoid macular degeneration of right eye   . Insomnia, persistent 08/16/2011  . HYPERLIPIDEMIA-MIXED 09/30/2010    Past Medical History: Past Medical History  Diagnosis Date  . Cholelithiasis     asymptomatic  . Osteoporosis   . Plantar fasciitis     Morton's Neuroma  . Actinic keratosis     back, upper extremities: s/p liquid nitro 01/29/06  . Diverticulosis   . HLD (hyperlipidemia)   . HTN (hypertension)   . Depression   . Low back pain   . 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 COPD (chronic obstructive pulmonary disease)   . Cataract     s/p extraction 2007, 2005    Past Surgical History: Past Surgical History  Procedure Laterality Date  . Colonoscopy      3/09, one sigmoid polyp; no tics   . Vesicovaginal fistula closure w/ tah    . Abdominal hysterectomy    . Nm myoview ltd  Oct 2011    normal, EF 89%, study limited by lung disease    Past Gynecologic History:  Menarche:  15 Menstrual details: 7 days Menses regular: n/a Last Menstrual Period: menopausal History of OCP/HRT use: unknown History of Abnormal pap: no Last pap: 2 years ago History of STDs: noncontributory Contraception: n/a  Sexually active: noncontributory  OB History: G1P1   Family History: Family History  Problem Relation Age of Onset  . Breast cancer Mother   . Lung cancer Maternal Uncle   . Gallbladder disease  Maternal Aunt   . Aneurysm Father     Social History: History   Social History  . Marital Status: Widowed    Spouse Name: N/A  . Number of Children: N/A  . Years of Education: N/A   Occupational History  . Not on file.   Social History Main Topics  . Smoking status: Former Smoker -- 1.00 packs/day for 25 years    Types: Cigarettes    Quit date: 08/15/1985  . Smokeless tobacco: Never Used     Comment: quit 20 years ago -started at age 10  . Alcohol Use: No  . Drug Use: No  . Sexual Activity: Not on file   Other Topics Concern  . Not on file   Social History Narrative   Widowed, lives in [place], gets regular exercise by working in the yard.     Allergies: Allergies  Allergen Reactions  . Codeine     REACTION: giup set  . Demerol Nausea And Vomiting  . Hctz [Hydrochlorothiazide] Other (See Comments)    Hyponatremia   . Levaquin [Levofloxacin In D5w] Nausea And Vomiting  . Propoxyphene N-Acetaminophen     REACTION: gi upset  . Sulfonamide Derivatives     REACTION: gi upset    Current Medications: Current Outpatient Prescriptions  Medication Sig Dispense Refill  . ALPRAZolam (XANAX) 0.5 MG tablet Take 1 tablet (0.5 mg total) by mouth at bedtime as needed for anxiety. 30 tablet 5  . aspirin 81 MG EC tablet Take 81 mg by mouth daily.      . cholecalciferol (VITAMIN D) 1000 UNITS tablet Take 1,000 Units by mouth daily.    . enalapril (VASOTEC) 5 MG tablet TAKE 1 TABLET BY MOUTH EACH DAY 30 tablet 6  . fexofenadine (ALLEGRA) 180 MG tablet Take 180 mg by mouth daily.      Marland Kitchen omeprazole (PRILOSEC) 40 MG capsule TAKE ONE (1) CAPSULE EACH DAY 30 capsule 6  . simvastatin (ZOCOR) 40 MG tablet TAKE 1 TABLET EVERY EVENING FOR CHOLESTEROL 30 tablet 6  . albuterol (PROAIR HFA) 108 (90 BASE) MCG/ACT inhaler Inhale 2 puffs into the lungs every 6 (six) hours as needed for wheezing or shortness of breath. (Patient not taking: Reported on 07/15/2015) 6.7 g 3  . Lactulose 20  GM/30ML SOLN 30 ml every 4 hours until constipation is relieved (Patient not taking: Reported on 07/15/2015) 236 mL 3  . traMADol (ULTRAM) 50 MG tablet Take 1 tablet (50 mg total) by mouth every 8 (eight) hours as needed. (Patient not taking: Reported on 07/06/2015) 90 tablet 0   No current facility-administered medications for this visit.    Review of Systems General: positive for weight loss Skin: negative HEENT: negative Breasts: negative Pulmonary: negative Cardiac: negative Gastrointestinal: negative for, nausea, vomiting, pain, constipation, diarrhea, hematochezia Genitourinary/Sexual: negative for, dysuria, retention, infections, incontinence Ob/Gyn: negative for, irregular bleeding, pain Musculoskeletal: pain back pain chronic} Neurologic/Psych: anxiety  Objective:  Physical Examination:  BP 166/92 mmHg  Pulse 83  Temp(Src) 97.8 F (36.6 C) (Tympanic)  Resp 18  Wt 122 lb 9.2 oz (55.6  kg)  SpO2 93%   ECOG Performance Status: 1 - Symptomatic but completely ambulatory  General appearance: alert, cooperative and appears stated age HEENT:PERRLA and sclera clear, anicteric Lymph node survey: non-palpable, axillary, inguinal, supraclavicular Cardiovascular: regular rate and rhythm Respiratory: normal air entry, lungs clear to auscultation Breast exam: right breast normal without mass, skin or nipple changes or axillary nodes. Left breast fibrocystic changes more prominent compared to right. No nipple retraction or discharge.  Abdomen: soft, non-tender, without masses or organomegaly, nondistended, no hernias and well healed incision. No ascites.  Extremities: extremities normal, atraumatic, no cyanosis or edema Neurological exam reveals alert, oriented, normal speech, no focal findings or movement disorder noted. Very anxious  Pelvic: exam chaperoned by nurse, EGBUS within normal limits, normal vagina and vulva, cervix and uterus surgically absent;  BME: ovaries not palpated,  no masses, tenderness or lesions; Rectal: confirmatory, thickened RV septum by the anal sphincter especially on the right from 10-12 o'clock. No gross blood.     Lab Review HE4 ordered  Radiologic Imaging: Reviewed CT and ultrasound    Assessment:  KEELY DRENNAN is a 80 y.o. female diagnosed with elevated BM211 of uncertain etiology. Pelvic exam negative for pelvic masses or nodularity and decreased abdominal symptoms are reassuring. Loss of appetite and decreased weight concerning for malignancy, but nonspecific. Abnormal left breast exam in setting of family history of breast cancer. Possible thickened rectovaginal septum and sphincter, may be secondary to known internal hemorrhoid.  Plan:   Problem List Items Addressed This Visit      Other   Elevated tumor markers - Primary   Relevant Orders   Human Epididymis Prot 4,Serial    Other Visit Diagnoses    Breast mass, left        Relevant Orders    Ambulatory referral to General Surgery       I recommended HE4 for further evaluation. If this test is negative then I suspect the CA125 is nonspecific elevation. If HE4 is positive we will re-request a PET scan.   With regard to breast and rectovaginal exam findings I have referred her to Dr. Bary Castilla for evaluation and I spoke with him on the phone today regarding my findings. He is happy to see her and we have an appointment for her to see him on Monday at 1:45PM.    The patient's diagnosis, an outline of the further diagnostic and laboratory studies which will be required, the recommendation, and alternatives were discussed.  All questions were answered to the patient's and her daughter's satisfaction. They agree with the plan.    Gillis Ends, MD    CC:  Dr. Forest Gleason  Crecencio Mc, MD [address], [place] [address] [phone]

## 2015-07-17 LAB — MISC LABCORP TEST (SEND OUT): Labcorp test code: 481700

## 2015-07-20 ENCOUNTER — Ambulatory Visit (INDEPENDENT_AMBULATORY_CARE_PROVIDER_SITE_OTHER): Payer: Medicare Other | Admitting: General Surgery

## 2015-07-20 ENCOUNTER — Encounter: Payer: Self-pay | Admitting: General Surgery

## 2015-07-20 VITALS — BP 140/76 | HR 72 | Resp 14 | Ht 60.0 in | Wt 124.0 lb

## 2015-07-20 DIAGNOSIS — R971 Elevated cancer antigen 125 [CA 125]: Secondary | ICD-10-CM

## 2015-07-20 DIAGNOSIS — R1012 Left upper quadrant pain: Secondary | ICD-10-CM | POA: Diagnosis not present

## 2015-07-20 DIAGNOSIS — R630 Anorexia: Secondary | ICD-10-CM

## 2015-07-20 NOTE — Progress Notes (Signed)
Patient ID: Caitlin Mason, female   DOB: 12-06-1935, 80 y.o.   MRN: [id number]  Chief Complaint  Patient presents with  . Abdominal Pain    HPI Caitlin Mason is a 80 y.o. female  Here for evaluation of diverticulosis and increasing CA 125 values. She has been having problems with abdominal distention and lack of appetite that started in April 2016. The patient denies any abdominal pain with eating, but rather a loss of appetite. She has had a very modest weight loss over the last 2 months, 4 pounds different between her PCP visit on 05/20/2015 when she weighed 128 pounds, and today's visit when she weighs 124 pounds. She reports that she is a good Caitlin Mason, has been living independently since her husband died in 80 and does cook and encourage herself to eat regularly. Her concern was the bloating without any clear-cut pain and the general loss of appetite. She reports her bowels have been less went with her lower oral intake, and she has made use of some waxes for this with mixed results. She denies any passage of blood or mucus in the bowel movements. She had a CT of the abdomin and pelvis on 04/22/15 showing diverticulosis. She has had her CA 125 drawn showing an increase in the last 3 months. Her pelvic ultrasound done on 07/10/15 did not show evidence of free fluid or ovarian enlargement.  She was evaluated by Dr. Theora Gianotti from GYN oncology last week, and while her abdominal exam was unremarkable, there was some concern about thickening in the upper-outer quadrant of left breast. She herself is unaware of any problems with her breasts. Mammogram completed in January 2016 was unremarkable.    HPI  Past Medical History  Diagnosis Date  . Cholelithiasis     asymptomatic  . Osteoporosis   . Plantar fasciitis     Morton's Neuroma  . Actinic keratosis     back, upper extremities: s/p liquid nitro 01/29/06  . Diverticulosis   . HLD (hyperlipidemia)   . HTN (hypertension)   . Depression   .  Low back pain   . 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 COPD (chronic obstructive pulmonary disease)   . Cataract     s/p extraction 2007, 2005    Past Surgical History  Procedure Laterality Date  . Colonoscopy      3/09, one sigmoid polyp; no tics   . Vesicovaginal fistula closure w/ tah    . Abdominal hysterectomy    . Nm myoview ltd  Oct 2011    normal, EF 89%, study limited by lung disease    Family History  Problem Relation Age of Onset  . Breast cancer Mother   . Lung cancer Maternal Uncle   . Gallbladder disease Maternal Aunt   . Aneurysm Father     Social History History  Substance Use Topics  . Smoking status: Former Smoker -- 1.00 packs/day for 25 years    Types: Cigarettes    Quit date: 08/15/1985  . Smokeless tobacco: Never Used     Comment: quit 20 years ago -started at age 26  . Alcohol Use: No    Allergies  Allergen Reactions  . Codeine     REACTION: giup set  . Demerol Nausea And Vomiting  . Hctz [Hydrochlorothiazide] Other (See Comments)    Hyponatremia   . Levaquin [Levofloxacin In D5w] Nausea And Vomiting  . Propoxyphene N-Acetaminophen  REACTION: gi upset  . Sulfonamide Derivatives     REACTION: gi upset    Current Outpatient Prescriptions  Medication Sig Dispense Refill  . albuterol (PROAIR HFA) 108 (90 BASE) MCG/ACT inhaler Inhale 2 puffs into the lungs every 6 (six) hours as needed for wheezing or shortness of breath. 6.7 g 3  . ALPRAZolam (XANAX) 0.5 MG tablet Take 1 tablet (0.5 mg total) by mouth at bedtime as needed for anxiety. 30 tablet 5  . aspirin 81 MG EC tablet Take 81 mg by mouth daily.      . cholecalciferol (VITAMIN D) 1000 UNITS tablet Take 1,000 Units by mouth daily.    . enalapril (VASOTEC) 5 MG tablet TAKE 1 TABLET BY MOUTH EACH DAY 30 tablet 6  . fexofenadine (ALLEGRA) 180 MG tablet Take 180 mg by mouth daily.      . Lactulose 20 GM/30ML SOLN 30 ml every 4 hours until  constipation is relieved 236 mL 3  . omeprazole (PRILOSEC) 40 MG capsule TAKE ONE (1) CAPSULE EACH DAY 30 capsule 6  . simvastatin (ZOCOR) 40 MG tablet TAKE 1 TABLET EVERY EVENING FOR CHOLESTEROL 30 tablet 6  . traMADol (ULTRAM) 50 MG tablet Take 1 tablet (50 mg total) by mouth every 8 (eight) hours as needed. 90 tablet 0   No current facility-administered medications for this visit.    Review of Systems Review of Systems  Constitutional: Positive for appetite change and unexpected weight change. Negative for fever, chills, diaphoresis, activity change and fatigue.  HENT: Negative.   Eyes: Negative.   Respiratory: Negative.   Cardiovascular: Negative.   Gastrointestinal: Positive for abdominal distention. Negative for nausea, vomiting, abdominal pain, diarrhea, constipation, blood in stool, anal bleeding and rectal pain.  Endocrine: Negative.   Genitourinary: Negative.   Allergic/Immunologic: Negative.   Neurological: Negative.   Hematological: Negative.   Psychiatric/Behavioral: Negative.     Blood pressure 140/76, pulse 72, resp. rate 14, height 5' (1.524 m), weight 124 lb (56.246 kg).  Physical Exam Physical Exam  Constitutional: She is oriented to person, place, and time. She appears well-developed and well-nourished.  HENT:  Head: Normocephalic.  Mouth/Throat: Oropharynx is clear and moist and mucous membranes are normal.  Eyes: Conjunctivae are normal. No scleral icterus.  Neck: Trachea normal. Neck supple. Carotid bruit is not present. No thyroid mass present.  Cardiovascular: Normal rate, regular rhythm and normal heart sounds.   Pulmonary/Chest: Effort normal and breath sounds normal. Right breast exhibits no inverted nipple, no mass, no nipple discharge, no skin change and no tenderness. Left breast exhibits no inverted nipple, no mass, no nipple discharge, no skin change and no tenderness.  Left breast verticle scar at 12 o'clk Right breast upper outer quadrant scar  near the axilla.   Abdominal: Soft. Normal appearance and bowel sounds are normal. There is tenderness in the left upper quadrant.    Lymphadenopathy:    She has no cervical adenopathy.    She has no axillary adenopathy.       Right: No inguinal adenopathy present.       Left: No inguinal adenopathy present.  Neurological: She is alert and oriented to person, place, and time. She has normal strength. No cranial nerve deficit. Gait normal.  Skin: Skin is warm and dry.  Psychiatric: She has a normal mood and affect. Her speech is normal and behavior is normal. Judgment and thought content normal. Cognition and memory are normal.    Data Reviewed  Bilateral screening mammograms in  January 2016 were reviewed. Essentially fatty replaced breasts. BI-RADS-1.  CT scan of the abdomen and pelvis with contrast dated Apr 22, 2015 was reviewed. No evidence of free fluid. No mass effect. No colonic dilatation. Calcification at the orifice of the celiac axis without evidence of stenosis. Mild diffuse calcification throughout the remaining aorta. SMA origin was normal. No adenopathy evident. No evidence of omental caking. Sigmoid diverticulosis is identified. No evidence of diverticulitis.  Ultrasound of the abdomen dated July 10, 2015 was reviewed. No free fluid. Neither ovary visualized. Uterus absent.  Laboratory studies shows a rising CEA 125 increasing from 90-120 over 3 months.  Specialty laboratory assessment:Test Ordered: [id number] Human Epididymis Prot 4,Serial  HE4              107       pmol/L  BN    Reference Range: 0-150   Phone conversation with Dr. Theora Gianotti and review of her GYN oncology note.                Assessment     Abdominal bloating, modest weight loss, elevated CA 125.     Plan    There is no clear etiology for the patient's symptom is evident on laboratory testing or imaging studies. Her symptoms are not suggestive of diverticulitis. With  her clinical history there would certainly be a concern for ovarian cancer. Consideration can be given towards diagnostic laparoscopy. I will contact Dr. Theora Gianotti and next 24-48 hours and review the case with her, and notify the patient of that discussion.  The thickening in the rectovaginal septum noted on her GYN exam may be secondary to repair of the vesicovaginal fistula closure. Review of the pelvis images on the CT scan shows a uniform density throughout the area bilaterally.    PCP: Dr Mattie Marlin 07/21/2015, 6:20 AM

## 2015-07-21 DIAGNOSIS — R1012 Left upper quadrant pain: Secondary | ICD-10-CM | POA: Insufficient documentation

## 2015-07-21 DIAGNOSIS — R630 Anorexia: Secondary | ICD-10-CM | POA: Insufficient documentation

## 2015-07-22 ENCOUNTER — Telehealth: Payer: Self-pay | Admitting: *Deleted

## 2015-07-22 NOTE — Telephone Encounter (Signed)
Spoke with patient. Patient reassured. HE4 results are within normal range.

## 2015-07-23 ENCOUNTER — Telehealth: Payer: Self-pay | Admitting: General Surgery

## 2015-07-23 NOTE — Telephone Encounter (Signed)
The patient was contacted after I spoke with Dr. Theora Gianotti from GYN oncology. The patient's most recent laboratory studies, Human Epididymis Prot 4, was normal. It is thought that there is little likelihood that she has ovarian cancer with this result.  Dr. Theora Gianotti felt that the risk of laparoscopy outweighed the opportunity to identify an occult ovarian cancer based on laboratory studies and imaging.  Arrangements are in place for repeat CA 125 in October 2016. This will be arranged through this office.  The patient reported she develops any new abdominal symptoms.

## 2015-08-13 ENCOUNTER — Other Ambulatory Visit: Payer: Self-pay | Admitting: *Deleted

## 2015-08-13 DIAGNOSIS — R971 Elevated cancer antigen 125 [CA 125]: Secondary | ICD-10-CM

## 2015-08-13 NOTE — Progress Notes (Signed)
Received inbasket msg from Dr. Theora Gianotti to please arrange follow up with Dr. Theora Gianotti in October with repeat CA125 and HE4.  Orders entered; inbakset msg sent to scheduling to arrange for appointment.

## 2015-08-16 ENCOUNTER — Telehealth: Payer: Self-pay | Admitting: Obstetrics and Gynecology

## 2015-08-16 NOTE — Telephone Encounter (Signed)
I spoke with Caitlin Mason (cell [phone]) regarding her mother's elevated CA125 and our plan. Caitlin Mason HE4 was normal and her exam with Caitlin Mason was reassuring. Therefore I have recommended continued close follow up of tumor markers and physical exams. We discussed the fact that her GI symptoms which prompted the CA125 have resolved. This is always reassuring. We also discussed her weight loss which is concerning and can be a symptom of malignancy and warrants continued follow up.  She will return to our clinic in October.  Caitlin Ends, MD

## 2015-09-16 ENCOUNTER — Other Ambulatory Visit: Payer: Self-pay

## 2015-09-16 DIAGNOSIS — R971 Elevated cancer antigen 125 [CA 125]: Secondary | ICD-10-CM

## 2015-09-30 ENCOUNTER — Inpatient Hospital Stay (HOSPITAL_BASED_OUTPATIENT_CLINIC_OR_DEPARTMENT_OTHER): Payer: Medicare Other | Admitting: Obstetrics and Gynecology

## 2015-09-30 ENCOUNTER — Inpatient Hospital Stay: Payer: Medicare Other | Attending: Obstetrics and Gynecology

## 2015-09-30 VITALS — BP 169/96 | HR 91 | Temp 98.1°F | Wt 124.1 lb

## 2015-09-30 DIAGNOSIS — R634 Abnormal weight loss: Secondary | ICD-10-CM | POA: Diagnosis not present

## 2015-09-30 DIAGNOSIS — R971 Elevated cancer antigen 125 [CA 125]: Secondary | ICD-10-CM

## 2015-09-30 DIAGNOSIS — E785 Hyperlipidemia, unspecified: Secondary | ICD-10-CM | POA: Insufficient documentation

## 2015-09-30 DIAGNOSIS — Z87891 Personal history of nicotine dependence: Secondary | ICD-10-CM | POA: Insufficient documentation

## 2015-09-30 DIAGNOSIS — Z780 Asymptomatic menopausal state: Secondary | ICD-10-CM | POA: Diagnosis not present

## 2015-09-30 DIAGNOSIS — K5730 Diverticulosis of large intestine without perforation or abscess without bleeding: Secondary | ICD-10-CM | POA: Diagnosis not present

## 2015-09-30 DIAGNOSIS — I10 Essential (primary) hypertension: Secondary | ICD-10-CM | POA: Insufficient documentation

## 2015-09-30 DIAGNOSIS — M810 Age-related osteoporosis without current pathological fracture: Secondary | ICD-10-CM | POA: Diagnosis not present

## 2015-09-30 DIAGNOSIS — J449 Chronic obstructive pulmonary disease, unspecified: Secondary | ICD-10-CM | POA: Insufficient documentation

## 2015-09-30 DIAGNOSIS — K5790 Diverticulosis of intestine, part unspecified, without perforation or abscess without bleeding: Secondary | ICD-10-CM

## 2015-09-30 DIAGNOSIS — H3530 Unspecified macular degeneration: Secondary | ICD-10-CM | POA: Diagnosis not present

## 2015-09-30 NOTE — Progress Notes (Signed)
Gynecologic Oncology Consult Visit   Referring Provider: Dr. Forest Gleason  Primary Care Physician:  Deborra Medina, MD  Chief Concern: Elevated CA125  Subjective:  Caitlin Mason is a 80 y.o. female G1P1 s/p TAH for benign disease (bilateral ovaries in situ) who is seen in consultation from Dr. Oliva Bustard for evaluation regarding high CA-125 that was obtained because of history of persistent bloating and abdominal distension. Patient underwent abdominal CT scan which revealed diverticulosis as noted below. She has a h/o diverticulitis and multiple flares but feels like her last flare was at least a year ago. CA125 values have increased, but her symptoms have improved. Yet she lost 8 lb over 3-4 months and had a decreased appetite, but better now and she has not lost any more weight.  No new symptoms.  HE4 7/27 = 107 (normal).  Saw Dr Bary Castilla 8/16 and he did not think that diverticulitis was cause of weight loss or elevated CA125.  He did breast exam and was not concerned.   Lab Results  Component Value Date   CA125 120.1* 06/29/2015   CA125 90* 04/13/2015    CT scan abdomen and pelvis 04/22/2015 IMPRESSION: 1. No abdominal or pelvic mass or adenopathy is seen. No fluid is noted in the pelvis. 2. Multiple rectosigmoid colon diverticula. No diverticulitis. 3. Anterolisthesis of L4 on L5 by a proximal 5 mm most likely due to degenerative change involving the facet joints.  Transabdominal/Transvaginal US 07/10/2015 IMPRESSION: Neither ovary is visualized, likely due to atrophy from postmenopausal state and overlying gas. Uterus absent. No pelvic mass or fluid identified. No inflammatory focus appreciated. If there remains concern for potential ovarian pathology given the elevated CA 125 value, pelvic MR would be the imaging study of choice for further assessment.  Patient is getting regular mammograms  Last 12/2014 BI-RADS 1 negative. Colonoscopy 2014 was negative per patient.  PET was  ordered but denied by insurance.   Of note when I asked her about the vesicovaginal fistula she did not recall ever having this diagnosis. Her hysterectomy was performed for abnormal vaginal bleeding.   Problem List: Patient Active Problem List   Diagnosis Date Noted  . Loss of appetite for more than 2 weeks 07/21/2015  . Left upper quadrant pain 07/21/2015  . Elevated tumor markers 07/15/2015  . Elevated CA-125 04/15/2015  . Constipation 04/13/2015  . Diverticulosis of colon without hemorrhage 04/13/2015  . Leg pain, left 01/04/2015  . Hyponatremia 05/18/2014  . Essential hypertension, benign 05/18/2014  . Sciatica of right side 05/16/2014  . Encounter for Medicare annual wellness exam 05/14/2013  . Sciatica of left side 09/09/2012  . Lumbago 08/18/2012  . Cataract   . COPD (chronic obstructive pulmonary disease) ([place])   . Cystoid macular degeneration of right eye   . Insomnia, persistent 08/16/2011  . HYPERLIPIDEMIA-MIXED 09/30/2010    Past Medical History: Past Medical History  Diagnosis Date  . Cholelithiasis     asymptomatic  . Osteoporosis   . Plantar fasciitis     Morton's Neuroma  . Actinic keratosis     back, upper extremities: s/p liquid nitro 01/29/06  . Diverticulosis   . HLD (hyperlipidemia)   . HTN (hypertension)   . Depression   . Low back pain   . 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 COPD (chronic obstructive pulmonary disease)   . Cataract     s/p extraction 2007, 2005    Past Surgical History:  Past Surgical History  Procedure Laterality Date  . Colonoscopy      3/09, one sigmoid polyp; no tics   . Vesicovaginal fistula closure w/ tah    . Abdominal hysterectomy    . Nm myoview ltd  Oct 2011    normal, EF 89%, study limited by lung disease    Past Gynecologic History:  Menarche: 15 Menstrual details: 7 days Menses regular: n/a Last Menstrual Period: menopausal History of OCP/HRT use:  unknown History of Abnormal pap: no Last pap: 2 years ago History of STDs: noncontributory Contraception: n/a  Sexually active: noncontributory  OB History: G1P1  Family History: Family History  Problem Relation Age of Onset  . Breast cancer Mother   . Lung cancer Maternal Uncle   . Gallbladder disease Maternal Aunt   . Aneurysm Father     Social History: Social History   Social History  . Marital Status: Widowed    Spouse Name: N/A  . Number of Children: N/A  . Years of Education: N/A   Occupational History  . Not on file.   Social History Main Topics  . Smoking status: Former Smoker -- 1.00 packs/day for 25 years    Types: Cigarettes    Quit date: 08/15/1985  . Smokeless tobacco: Never Used     Comment: quit 20 years ago -started at age 36  . Alcohol Use: No  . Drug Use: No  . Sexual Activity: Not on file   Other Topics Concern  . Not on file   Social History Narrative   Widowed, lives in [place], gets regular exercise by working in the yard.     Allergies: Allergies  Allergen Reactions  . Codeine     REACTION: giup set  . Demerol Nausea And Vomiting  . Hctz [Hydrochlorothiazide] Other (See Comments)    Hyponatremia   . Levaquin [Levofloxacin In D5w] Nausea And Vomiting  . Propoxyphene N-Acetaminophen     REACTION: gi upset  . Sulfonamide Derivatives     REACTION: gi upset    Current Medications: Current Outpatient Prescriptions  Medication Sig Dispense Refill  . albuterol (PROAIR HFA) 108 (90 BASE) MCG/ACT inhaler Inhale 2 puffs into the lungs every 6 (six) hours as needed for wheezing or shortness of breath. 6.7 g 3  . ALPRAZolam (XANAX) 0.5 MG tablet Take 1 tablet (0.5 mg total) by mouth at bedtime as needed for anxiety. 30 tablet 5  . aspirin 81 MG EC tablet Take 81 mg by mouth daily.      . cholecalciferol (VITAMIN D) 1000 UNITS tablet Take 1,000 Units by mouth daily.    . enalapril (VASOTEC) 5 MG tablet TAKE 1 TABLET BY MOUTH EACH DAY  30 tablet 6  . fexofenadine (ALLEGRA) 180 MG tablet Take 180 mg by mouth daily.      . Lactulose 20 GM/30ML SOLN 30 ml every 4 hours until constipation is relieved 236 mL 3  . omeprazole (PRILOSEC) 40 MG capsule TAKE ONE (1) CAPSULE EACH DAY 30 capsule 6  . simvastatin (ZOCOR) 40 MG tablet TAKE 1 TABLET EVERY EVENING FOR CHOLESTEROL 30 tablet 6  . traMADol (ULTRAM) 50 MG tablet Take 1 tablet (50 mg total) by mouth every 8 (eight) hours as needed. 90 tablet 0   No current facility-administered medications for this visit.    Review of Systems General: positive for weight loss Skin: negative HEENT: negative Breasts: negative Pulmonary: negative Cardiac: negative Gastrointestinal: negative for, nausea, vomiting, pain, constipation, diarrhea, hematochezia Genitourinary/Sexual: negative for,  dysuria, retention, infections, incontinence Ob/Gyn: negative for, irregular bleeding, pain Musculoskeletal: pain back pain chronic} Neurologic/Psych: anxiety  Objective:  Physical Examination:  BP 169/96 mmHg  Pulse 91  Temp(Src) 98.1 F (36.7 C)  Wt 124 lb 1.9 oz (56.3 kg)   ECOG Performance Status: 1 - Symptomatic but completely ambulatory  General appearance: alert, cooperative and appears stated age HEENT:PERRLA and sclera clear, anicteric Lymph node survey: non-palpable, axillary, inguinal, supraclavicular Cardiovascular: regular rate and rhythm Respiratory: normal air entry, lungs clear to auscultation Breast exam: right breast normal without mass, skin or nipple changes or axillary nodes. Left breast fibrocystic changes more prominent compared to right. No nipple retraction or discharge.  Abdomen: soft, non-tender, without masses or organomegaly, nondistended, no hernias and well healed incision. No ascites.  Extremities: extremities normal, atraumatic, no cyanosis or edema Neurological exam reveals alert, oriented, normal speech, no focal findings or movement disorder noted. Very  anxious  Pelvic: exam chaperoned by nurse, EGBUS within normal limits, normal vagina and vulva, cervix and uterus surgically absent;  BME: ovaries not palpated, no masses, tenderness or lesions; Rectal: confirmatory, thickened RV septum by the anal sphincter especially on the right from 10-12 o'clock. No gross blood.   Lab Review HE4 and CA125 ordered    Assessment:  ROCHEL PRIVETT is a 80 y.o. female diagnosed with elevated KD326 of uncertain etiology. Pelvic exam negative for pelvic masses or nodularity and decreased abdominal symptoms are reassuring. Loss of appetite and decreased weight concerning for malignancy, but nonspecific and better now. Abnormal left breast exam in setting of family history of breast cancer. Possible thickened rectovaginal septum and sphincter, may be secondary to known internal hemorrhoid.   Plan:   Problem List Items Addressed This Visit      Other   Elevated CA-125 - Primary      We have repeated CA125 and HE4 today.  And hopefully CA125 is stable or declining if elevation was due to diverticular disease. If rising we will consider re-requesting a PET scan.   The patient's diagnosis, an outline of the further diagnostic and laboratory studies which will be required, the recommendation, and alternatives were discussed.  All questions were answered to the patient's and her daughter's satisfaction. They agree with the plan.  We will contact her when the tumor marker results are available.   Mellody Drown, MD  CC: Dr. Forest Gleason  Crecencio Mc, MD [address], [place] [address] [phone]

## 2015-10-05 LAB — CA 125: CA 125: 104.5 U/mL — ABNORMAL HIGH (ref 0.0–38.1)

## 2015-10-05 LAB — HUMAN EPIDIDYMIS PROT 4,SERIAL: HE4: 109 pmol/L (ref 0–150)

## 2015-10-08 ENCOUNTER — Telehealth: Payer: Self-pay | Admitting: *Deleted

## 2015-10-08 NOTE — Telephone Encounter (Signed)
Telephoned patient with her HE4 and CA 125 results per Dr. Gershon Crane request. Informed patient that she would need a 6 month follow up and repeat labs. I also told her per Dr. Theora Gianotti if her follow up labs were still improving she could follow up with her PCP>

## 2015-10-12 ENCOUNTER — Other Ambulatory Visit: Payer: Self-pay | Admitting: *Deleted

## 2015-10-12 DIAGNOSIS — C801 Malignant (primary) neoplasm, unspecified: Secondary | ICD-10-CM

## 2015-10-19 ENCOUNTER — Ambulatory Visit: Payer: Medicare Other | Admitting: General Surgery

## 2015-11-05 ENCOUNTER — Other Ambulatory Visit: Payer: Self-pay

## 2015-11-05 ENCOUNTER — Telehealth: Payer: Self-pay | Admitting: Internal Medicine

## 2015-11-05 MED ORDER — ALBUTEROL SULFATE HFA 108 (90 BASE) MCG/ACT IN AERS: 2.0000 | INHALATION_SPRAY | Freq: Four times a day (QID) | RESPIRATORY_TRACT | Status: DC | PRN

## 2015-11-05 NOTE — Telephone Encounter (Signed)
rx sent to pharmacy

## 2015-11-05 NOTE — Telephone Encounter (Signed)
Pt called about needing another prescription for albuterol (PROAIR HFA) 108 (90 BASE) MCG/ACT inhaler to [place], [place], [place] Thank You!

## 2015-11-09 ENCOUNTER — Other Ambulatory Visit: Payer: Self-pay | Admitting: Internal Medicine

## 2015-11-09 NOTE — Telephone Encounter (Signed)
Last OV was 05/20/2015, Please advise?

## 2015-11-10 NOTE — Telephone Encounter (Signed)
Ok to refill,  printed rx  

## 2015-11-11 NOTE — Telephone Encounter (Signed)
Refill faxed as requested. 

## 2015-11-15 IMAGING — CT CT ABD-PELV W/ CM
2 of 5 series · 16 of 46 positions shown, 18 images · IV contrast (omnipaque)
Comparison: CT abdomen pelvis of 09/23/2009

CLINICAL DATA: Abdominal distention particularly in the left lower
quadrant, history of diverticulitis, elevated CA 125

EXAM:
CT ABDOMEN AND PELVIS WITH CONTRAST
TECHNIQUE: Multidetector CT imaging of the abdomen and pelvis was performed
using the standard protocol following bolus administration of
intravenous contrast.
CONTRAST:  85mL OMNIPAQUE IOHEXOL 300 MG/ML  SOLN

[Series 2: routine with · axial · 0.65mm/px · z∈[-947,-592]mm · 13 of 81 slices shown, 15 images]
[im 5/81  soft-tissue]
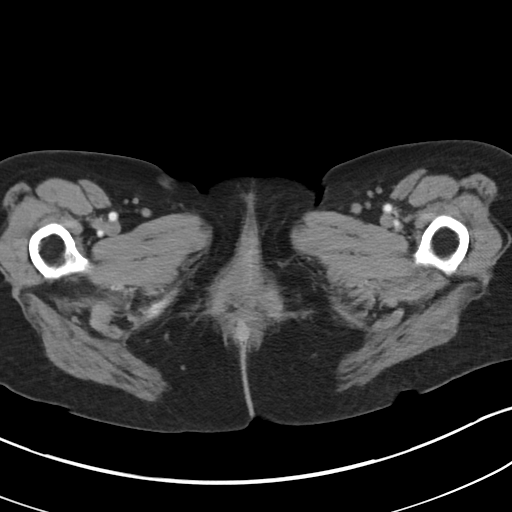
[im 5/81  bone]
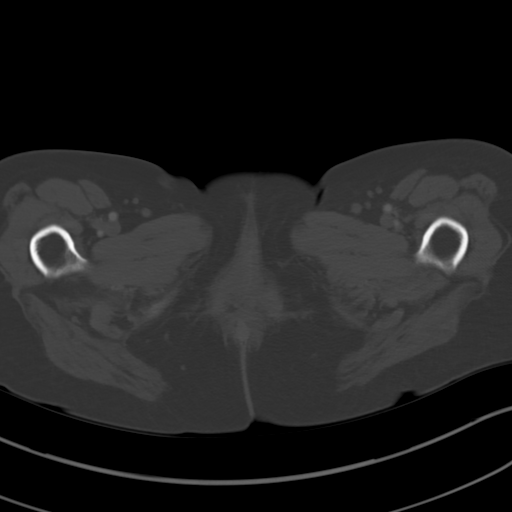
[im 13/81  soft-tissue]
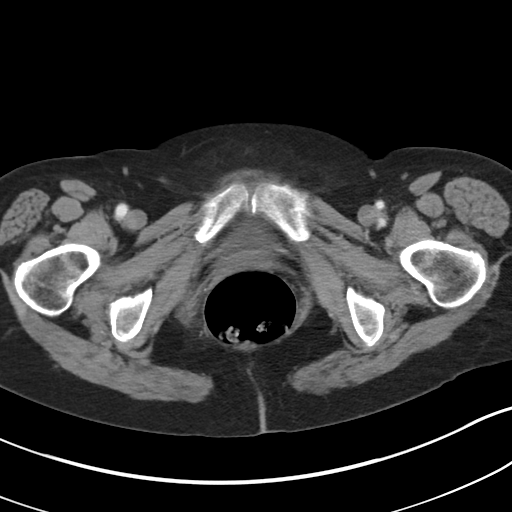
[im 17/81  soft-tissue]
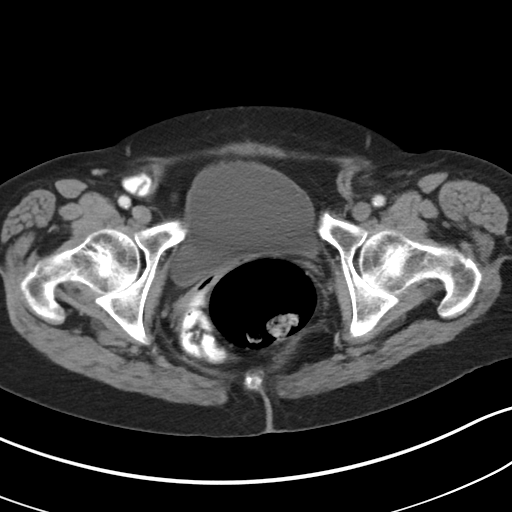
[im 22/81  soft-tissue]
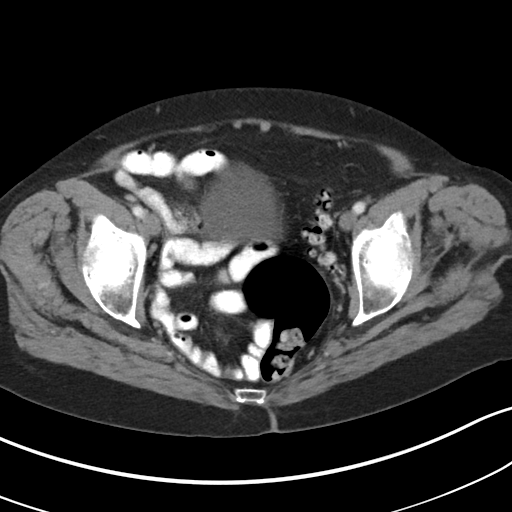
[im 30/81  soft-tissue]
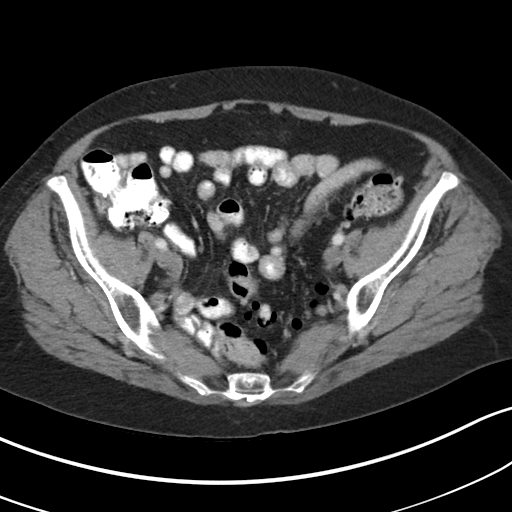
[im 34/81  soft-tissue]
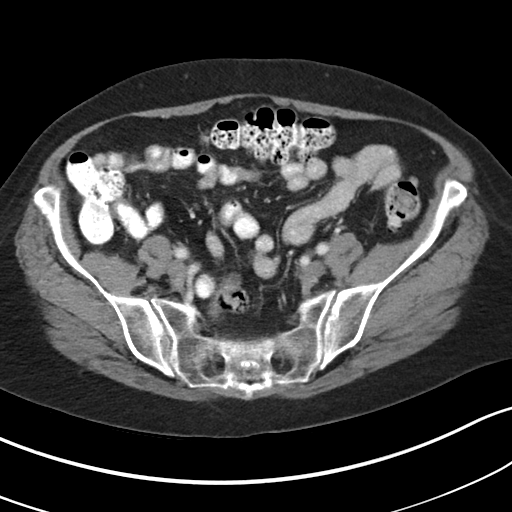
[im 43/81  soft-tissue]
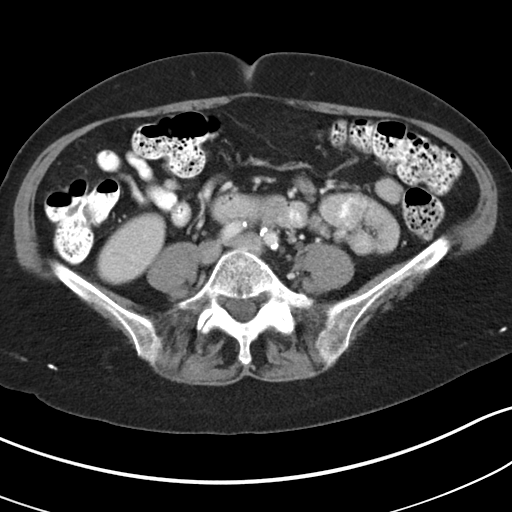
[im 47/81  soft-tissue]
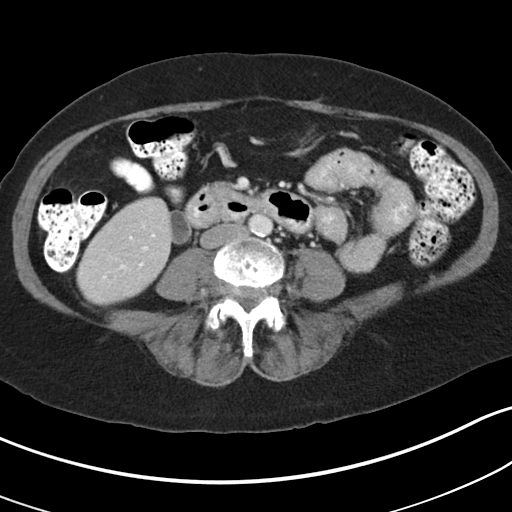
[im 51/81  soft-tissue]
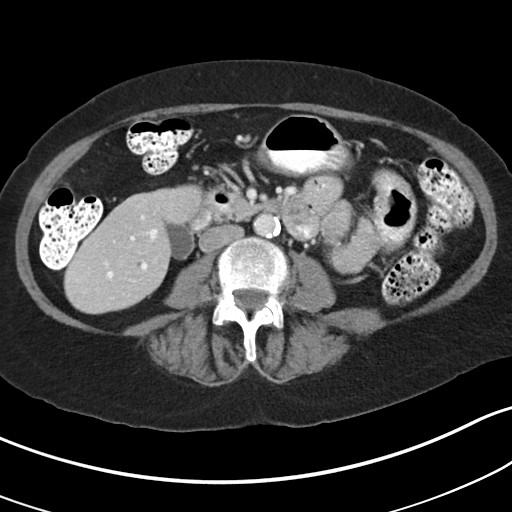
[im 51/81  bone]
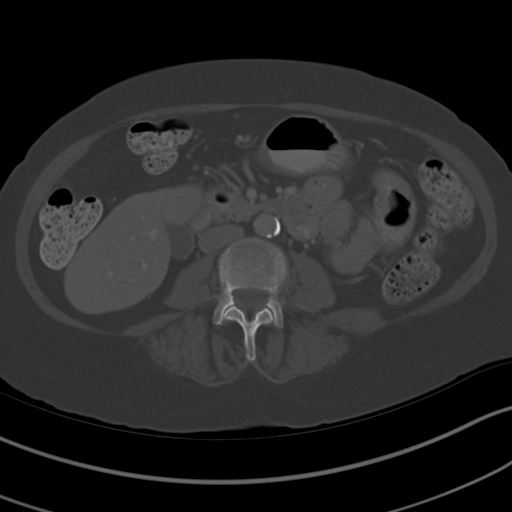
[im 59/81  soft-tissue]
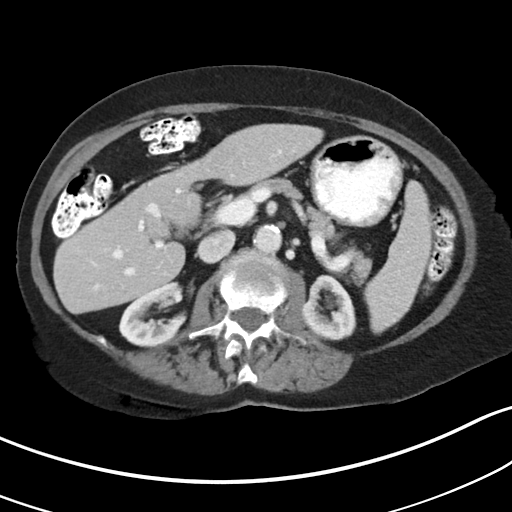
[im 64/81  soft-tissue]
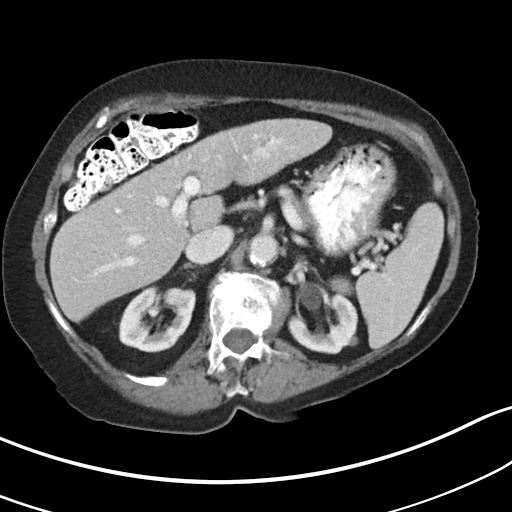
[im 68/81  soft-tissue]
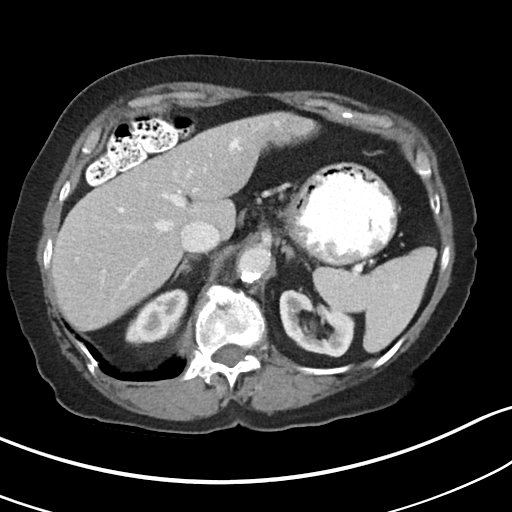
[im 76/81  soft-tissue]
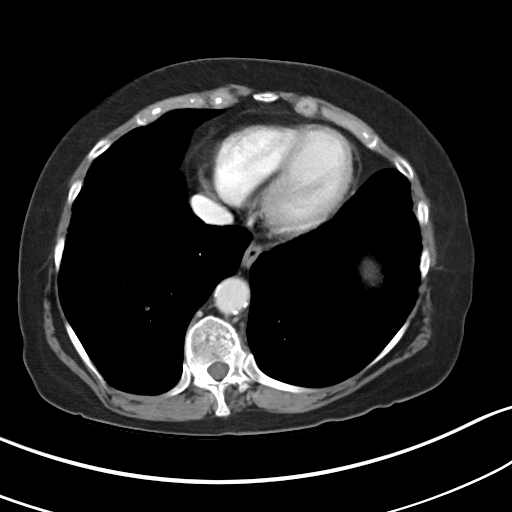

[Series 6: cor routine with · coronal · 0.63mm/px · 3 of 129 slices shown]
[im 43/129  soft-tissue]
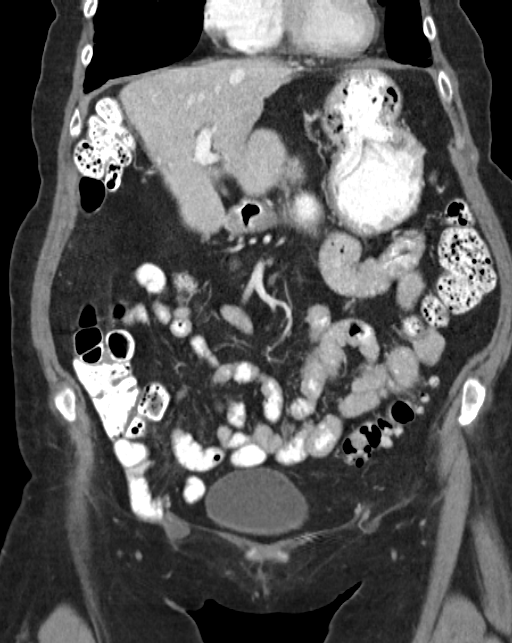
[im 57/129  soft-tissue]
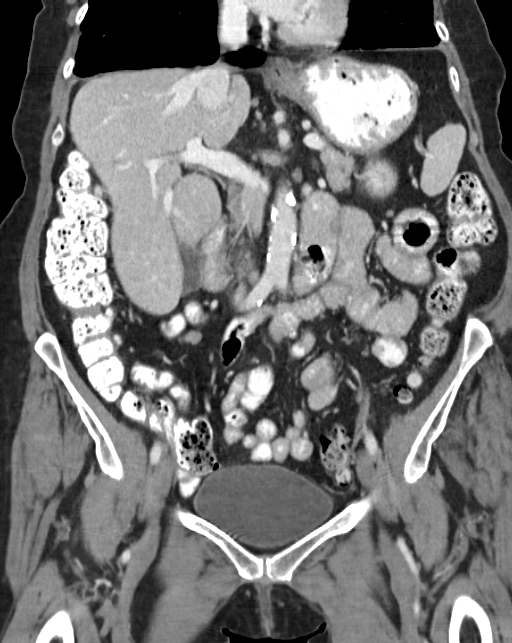
[im 72/129  soft-tissue]
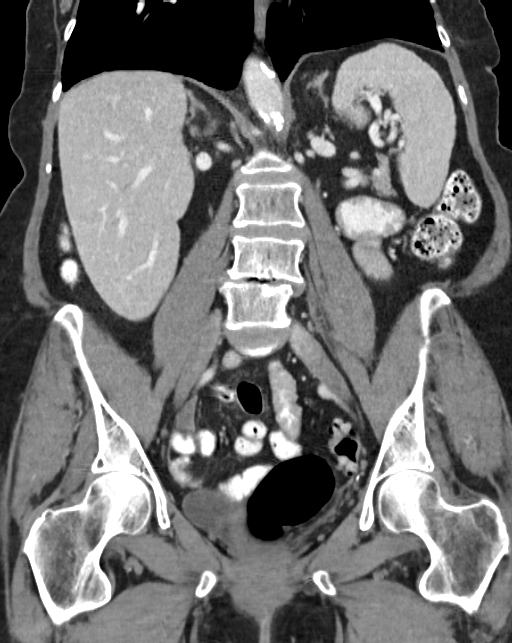

[16 of 46 positions shown; findings below may reference images not displayed]

FINDINGS: Minimal scarring remains in the periphery of the right middle lobe.
No focal infiltrate or effusion is seen. Heart size is stable. No
pericardial effusion is seen. The liver enhances with no focal
abnormality and no ductal dilatation is seen. No calcified
gallstones are noted. The pancreas is normal in size and the
pancreatic duct is not dilated. The adrenal glands and spleen are
unremarkable. The stomach is moderately distended with contrast and
food debris. The kidneys enhance with no calculus or mass and small
renal cysts are stable bilaterally. On delayed images, the
pelvocaliceal systems are unremarkable and the proximal ureters are
normal in caliber. The abdominal aorta is normal in caliber with
moderate atheromatous change present. No adenopathy is seen.

The urinary bladder is moderately distended with no abnormality
noted. The patient has previously undergone hysterectomy. No adnexal
lesion is seen. No fluid is noted within the pelvis. Multiple
rectosigmoid colon diverticula are present. No diverticulitis is
seen. The terminal ileum is unremarkable. The appendix has
previously been resected. No pelvic mass is noted and no adenopathy
is seen. A very small amount distal ileum is noted at the origin of
the right inguinal canal, but no definite herniation is seen. A
small amount of fat enters the left inguinal canal. There is
anterolisthesis of L4 on L5 by 5 mm apparently due to degenerative
change involving the facet joints at that level. Degenerative disc
disease is noted at L4-5 and at L1-2.
IMPRESSION: 1. No abdominal or pelvic mass or adenopathy is seen. No fluid is
noted in the pelvis.
2. Multiple rectosigmoid colon diverticula.  No diverticulitis.
3. Anterolisthesis of L4 on L5 by a proximal 5 mm most likely due to
degenerative change involving the facet joints.

## 2015-11-25 ENCOUNTER — Other Ambulatory Visit: Payer: Self-pay

## 2015-11-25 MED ORDER — SIMVASTATIN 40 MG PO TABS: 40.0000 mg | ORAL_TABLET | Freq: Once | ORAL | Status: DC

## 2015-12-23 ENCOUNTER — Other Ambulatory Visit: Payer: Self-pay | Admitting: Internal Medicine

## 2015-12-23 DIAGNOSIS — Z1231 Encounter for screening mammogram for malignant neoplasm of breast: Secondary | ICD-10-CM

## 2016-01-05 ENCOUNTER — Ambulatory Visit
Admission: RE | Admit: 2016-01-05 | Discharge: 2016-01-05 | Disposition: A | Discharge status: Home / Self Care | Payer: Medicare Other | Source: Ambulatory Visit | Attending: Internal Medicine | Admitting: Internal Medicine

## 2016-01-05 DIAGNOSIS — Z1231 Encounter for screening mammogram for malignant neoplasm of breast: Secondary | ICD-10-CM | POA: Insufficient documentation

## 2016-01-22 ENCOUNTER — Other Ambulatory Visit: Payer: Self-pay

## 2016-01-22 MED ORDER — OMEPRAZOLE 40 MG PO CPDR: DELAYED_RELEASE_CAPSULE | ORAL | Status: DC

## 2016-01-28 ENCOUNTER — Telehealth: Payer: Self-pay | Admitting: Internal Medicine

## 2016-01-28 ENCOUNTER — Other Ambulatory Visit: Payer: Self-pay

## 2016-01-28 MED ORDER — SERTRALINE HCL 25 MG PO TABS
25.0000 mg | ORAL_TABLET | Freq: Every day | ORAL | Status: DC
Start: 2016-01-28 — End: 2016-03-30

## 2016-01-28 MED ORDER — ENALAPRIL MALEATE 5 MG PO TABS: ORAL_TABLET | ORAL | Status: DC

## 2016-01-28 NOTE — Addendum Note (Signed)
Addended by: Crecencio Mc on: 01/28/2016 12:03 PM   Modules accepted: Orders

## 2016-01-28 NOTE — Telephone Encounter (Signed)
Pt called about needing something to calm her down in the day. Pt would like Zoloft. Pharmacy is [facility], [place]. Pt has to leave at 3pm. Call pt @ [phone]. Thank you!

## 2016-01-28 NOTE — Telephone Encounter (Signed)
Sertraline refilled.  RTC one month

## 2016-01-28 NOTE — Telephone Encounter (Signed)
Spoke with patient  Verbalized understanding

## 2016-01-28 NOTE — Telephone Encounter (Signed)
Patient would like zoloft, please advise, appt? Thanks

## 2016-02-04 ENCOUNTER — Telehealth: Payer: Self-pay

## 2016-02-04 DIAGNOSIS — R971 Elevated cancer antigen 125 [CA 125]: Secondary | ICD-10-CM

## 2016-02-04 NOTE — Telephone Encounter (Signed)
  Oncology Nurse Navigator Documentation  Navigator Location: [facility] (02/04/16 1600) Navigator Encounter Type: Telephone (02/04/16 1600) Telephone: Outgoing Call (02/04/16 1600) Abnormal Finding Date: 04/13/15 (02/04/16 1600) Confirmed Diagnosis Date:  (n/a) (02/04/16 1600) Surgery Date:  (n/a) (02/04/16 1600) Treatment Initiated Date:  (n/a) (02/04/16 1600)   Treatment Phase: Other (02/04/16 1600) Barriers/Navigation Needs: Coordination of Care;Education (02/04/16 1600) Education: Other (CA125, HE-4) (02/04/16 1600) Interventions: Other (02/04/16 1600)            Acuity: Level 3 (02/04/16 1600)     Acuity Level 3: Emotional needs;Ongoing guidance and education provided throughout treatment (02/04/16 1600)   Time Spent with Patient: 60 (02/04/16 1600)   Spoke with Caitlin Mason at length regarding her elevated CA125 and normal HE4. Reviewed her past imaging and MD notes with her. Order received from Dr Oliva Bustard for repeat CA125 and HE4 priot to next visit. Arranged for 03/22/16 1130. Pt also request to only see Dr Theora Gianotti. Will arrange. She has seen both physicians before.

## 2016-02-17 ENCOUNTER — Telehealth: Payer: Self-pay | Admitting: Surgical

## 2016-02-17 MED ORDER — FEXOFENADINE HCL 180 MG PO TABS: 180.0000 mg | ORAL_TABLET | Freq: Every day | ORAL | Status: DC

## 2016-02-17 NOTE — Telephone Encounter (Signed)
Pharmacy sent over fax for refill of Fexofenadine 180 mg. Historical provider. Patients last office visit 05/2015. Can I refill this.

## 2016-02-17 NOTE — Telephone Encounter (Signed)
refilled 

## 2016-03-22 ENCOUNTER — Inpatient Hospital Stay: Payer: Medicare Other | Attending: Oncology

## 2016-03-22 DIAGNOSIS — Z79899 Other long term (current) drug therapy: Secondary | ICD-10-CM | POA: Diagnosis not present

## 2016-03-22 DIAGNOSIS — J449 Chronic obstructive pulmonary disease, unspecified: Secondary | ICD-10-CM | POA: Diagnosis not present

## 2016-03-22 DIAGNOSIS — R630 Anorexia: Secondary | ICD-10-CM | POA: Insufficient documentation

## 2016-03-22 DIAGNOSIS — I10 Essential (primary) hypertension: Secondary | ICD-10-CM | POA: Diagnosis not present

## 2016-03-22 DIAGNOSIS — R339 Retention of urine, unspecified: Secondary | ICD-10-CM | POA: Insufficient documentation

## 2016-03-22 DIAGNOSIS — Z87891 Personal history of nicotine dependence: Secondary | ICD-10-CM | POA: Diagnosis not present

## 2016-03-22 DIAGNOSIS — Z90710 Acquired absence of both cervix and uterus: Secondary | ICD-10-CM | POA: Diagnosis not present

## 2016-03-22 DIAGNOSIS — N951 Menopausal and female climacteric states: Secondary | ICD-10-CM | POA: Insufficient documentation

## 2016-03-22 DIAGNOSIS — M545 Low back pain: Secondary | ICD-10-CM | POA: Insufficient documentation

## 2016-03-22 DIAGNOSIS — R971 Elevated cancer antigen 125 [CA 125]: Secondary | ICD-10-CM | POA: Insufficient documentation

## 2016-03-22 DIAGNOSIS — Z803 Family history of malignant neoplasm of breast: Secondary | ICD-10-CM | POA: Insufficient documentation

## 2016-03-22 DIAGNOSIS — Z7982 Long term (current) use of aspirin: Secondary | ICD-10-CM | POA: Insufficient documentation

## 2016-03-23 LAB — HUMAN EPIDIDYMIS PROT 4,SERIAL: HE4 (HUMAN EPID PROT 4): 100 pmol/L (ref 0–150)

## 2016-03-23 LAB — CA 125: CA 125: 90.1 U/mL — ABNORMAL HIGH (ref 0.0–38.1)

## 2016-03-30 ENCOUNTER — Encounter: Payer: Self-pay | Admitting: Obstetrics and Gynecology

## 2016-03-30 ENCOUNTER — Inpatient Hospital Stay (HOSPITAL_BASED_OUTPATIENT_CLINIC_OR_DEPARTMENT_OTHER): Payer: Medicare Other | Admitting: Obstetrics and Gynecology

## 2016-03-30 VITALS — BP 152/84 | HR 80 | Temp 97.1°F | Resp 20 | Wt 121.5 lb

## 2016-03-30 DIAGNOSIS — Z7982 Long term (current) use of aspirin: Secondary | ICD-10-CM

## 2016-03-30 DIAGNOSIS — N951 Menopausal and female climacteric states: Secondary | ICD-10-CM

## 2016-03-30 DIAGNOSIS — R978 Other abnormal tumor markers: Secondary | ICD-10-CM

## 2016-03-30 DIAGNOSIS — Z90710 Acquired absence of both cervix and uterus: Secondary | ICD-10-CM

## 2016-03-30 DIAGNOSIS — R971 Elevated cancer antigen 125 [CA 125]: Secondary | ICD-10-CM

## 2016-03-30 DIAGNOSIS — Z803 Family history of malignant neoplasm of breast: Secondary | ICD-10-CM

## 2016-03-30 DIAGNOSIS — I10 Essential (primary) hypertension: Secondary | ICD-10-CM

## 2016-03-30 DIAGNOSIS — R630 Anorexia: Secondary | ICD-10-CM | POA: Diagnosis not present

## 2016-03-30 DIAGNOSIS — R339 Retention of urine, unspecified: Secondary | ICD-10-CM

## 2016-03-30 DIAGNOSIS — Z87891 Personal history of nicotine dependence: Secondary | ICD-10-CM

## 2016-03-30 DIAGNOSIS — J449 Chronic obstructive pulmonary disease, unspecified: Secondary | ICD-10-CM

## 2016-03-30 DIAGNOSIS — Z79899 Other long term (current) drug therapy: Secondary | ICD-10-CM

## 2016-03-30 DIAGNOSIS — M545 Low back pain: Secondary | ICD-10-CM

## 2016-03-30 NOTE — Progress Notes (Signed)
Patient here today for follow up regarding elevated CA-125. Patient denies any concerns today.

## 2016-03-30 NOTE — Progress Notes (Signed)
  Oncology Nurse Navigator Documentation  Navigator Location: [facility] (03/30/16 0900) Navigator Encounter Type: [facility] Follow-up (03/30/16 0900)           Patient Visit Type: Follow-up (03/30/16 0900) Treatment Phase: Follow-up (03/30/16 0900) Barriers/Navigation Needs: Coordination of Care (03/30/16 0900)                          Time Spent with Patient: 15 (03/30/16 0900)   Chaperoned with pelvic exam.

## 2016-03-30 NOTE — Progress Notes (Signed)
Gynecologic Oncology Visit   Referring Provider: Dr. Forest Gleason  Primary Care Physician:  Deborra Medina, MD  Chief Concern: Elevated CA125  Subjective:  Caitlin Mason is a 81 y.o. female G1P1 s/p TAH for benign disease (bilateral ovaries in situ) who is seen for high [id number], uncertain etiology; normal HE4.   She was last seen by Dr. Fransisca Connors in October 2016. She had a normal CA125 and HE4. She presents today with complaints of decreased appetite, bladder retention, back pain, and allergy problems.   Lab Results  Component Value Date   CA125 90.1* 03/22/2016   CA125 104.5* 09/30/2015   CA125 120.1* 06/29/2015    HE4 100 03/22/2016          109 09/30/2015  Gynecologic Oncology History Caitlin Mason is a pleasant female G1P1 s/p TAH for benign disease (bilateral ovaries in situ) who is seen in consultation from Dr. Oliva Bustard for evaluation regarding high CA-125 (90, 04/13/2015) that was obtained because of history of persistent bloating and abdominal distension.    Patient underwent abdominal CT scan which revealed diverticulosis as noted below. She has a h/o diverticulitis and multiple flares but feels like her last flare was at least a year ago. CA125 values have increased, but her symptoms have improved.   HE4 7/27 = 107 (normal).  Saw Dr Bary Castilla 8/16 and he did not think that diverticulitis was cause of weight loss or elevated CA125.  He did breast exam and was not concerned.   CT scan abdomen and pelvis 04/22/2015 IMPRESSION: 1. No abdominal or pelvic mass or adenopathy is seen. No fluid is noted in the pelvis. 2. Multiple rectosigmoid colon diverticula. No diverticulitis. 3. Anterolisthesis of L4 on L5 by a proximal 5 mm most likely due to degenerative change involving the facet joints.  Transabdominal/Transvaginal US 07/10/2015 IMPRESSION: Neither ovary is visualized, likely due to atrophy from postmenopausal state and overlying gas. Uterus absent. No pelvic mass  or fluid identified. No inflammatory focus appreciated. If there remains concern for potential ovarian pathology given the elevated CA 125 value, pelvic MR would be the imaging study of choice for further assessment.  Patient is getting regular mammograms  Last 12/2014 BI-RADS 1 negative. Colonoscopy 2014 was negative per patient.  PET was ordered but denied by insurance.   Of note when I asked her about the vesicovaginal fistula she did not recall ever having this diagnosis. Her hysterectomy was performed for abnormal vaginal bleeding.   Follow up CA125 values Lab Results  Component Value Date   CA125 90.1* 03/22/2016   CA125 104.5* 09/30/2015   CA125 120.1* 06/29/2015   Follow up HE4 values 100 03/22/2016 109 09/30/2015  Problem List: Patient Active Problem List   Diagnosis Date Noted  . Loss of appetite for more than 2 weeks 07/21/2015  . Left upper quadrant pain 07/21/2015  . Elevated tumor markers 07/15/2015  . Elevated CA-125 04/15/2015  . Constipation 04/13/2015  . Diverticulosis of colon without hemorrhage 04/13/2015  . Leg pain, left 01/04/2015  . Hyponatremia 05/18/2014  . Essential hypertension, benign 05/18/2014  . Sciatica of right side 05/16/2014  . Encounter for Medicare annual wellness exam 05/14/2013  . Sciatica of left side 09/09/2012  . Lumbago 08/18/2012  . Cataract   . COPD (chronic obstructive pulmonary disease) ([place])   . Cystoid macular degeneration of right eye   . Insomnia, persistent 08/16/2011  . HYPERLIPIDEMIA-MIXED 09/30/2010    Past Medical History: Past Medical History  Diagnosis Date  . Cholelithiasis  asymptomatic  . Osteoporosis   . Plantar fasciitis     Morton's Neuroma  . Actinic keratosis     back, upper extremities: s/p liquid nitro 01/29/06  . Diverticulosis   . HLD (hyperlipidemia)   . HTN (hypertension)   . Depression   . Low back pain   . Cystoid macular degeneration of right eye Noc 2012    managed by [facility]   . COPD (chronic obstructive pulmonary disease) ([place])   . Cataract     s/p extraction 2007, 2005    Past Surgical History: Past Surgical History  Procedure Laterality Date  . Colonoscopy      3/09, one sigmoid polyp; no tics   . Vesicovaginal fistula closure w/ tah    . Abdominal hysterectomy    . Nm myoview ltd  Oct 2011    normal, EF 89%, study limited by lung disease  . Breast biopsy Bilateral     surgical bx    Past Gynecologic History:  Menarche: 15 Menstrual details: 7 days Menses regular: n/a Last Menstrual Period: menopausal History of OCP/HRT use: unknown History of Abnormal pap: no Last pap: 2 years ago History of STDs: noncontributory Contraception: n/a  Sexually active: noncontributory  OB History: G1P1  Family History: Family History  Problem Relation Age of Onset  . Breast cancer Mother 84  . Lung cancer Maternal Uncle   . Gallbladder disease Maternal Aunt   . Aneurysm Father     Social History: Social History   Social History  . Marital Status: Widowed    Spouse Name: N/A  . Number of Children: N/A  . Years of Education: N/A   Occupational History  . Not on file.   Social History Main Topics  . Smoking status: Former Smoker -- 1.00 packs/day for 25 years    Types: Cigarettes    Quit date: 08/15/1985  . Smokeless tobacco: Never Used     Comment: quit 20 years ago -started at age 18  . Alcohol Use: No  . Drug Use: No  . Sexual Activity: Not on file   Other Topics Concern  . Not on file   Social History Narrative   Widowed, lives in [place], gets regular exercise by working in the yard.     Allergies: Allergies  Allergen Reactions  . Codeine     REACTION: giup set  . Demerol Nausea And Vomiting  . Hctz [Hydrochlorothiazide] Other (See Comments)    Hyponatremia   . Levaquin [Levofloxacin In D5w] Nausea And Vomiting  . Propoxyphene N-Acetaminophen     REACTION: gi upset  . Sulfonamide Derivatives     REACTION: gi upset     Current Medications: Current Outpatient Prescriptions  Medication Sig Dispense Refill  . albuterol (PROAIR HFA) 108 (90 BASE) MCG/ACT inhaler Inhale 2 puffs into the lungs every 6 (six) hours as needed for wheezing or shortness of breath. 6.7 g 3  . ALPRAZolam (XANAX) 0.5 MG tablet TAKE ONE TABLET BY MOUTH AT BEDTIME AS NEEDED ANXIETY 30 tablet 5  . aspirin 81 MG EC tablet Take 81 mg by mouth daily.      . cholecalciferol (VITAMIN D) 1000 UNITS tablet Take 1,000 Units by mouth daily.    . enalapril (VASOTEC) 5 MG tablet TAKE 1 TABLET BY MOUTH EACH DAY 30 tablet 6  . fexofenadine (ALLEGRA) 180 MG tablet Take 1 tablet (180 mg total) by mouth daily. 90 tablet 1  . omeprazole (PRILOSEC) 40 MG capsule TAKE ONE (1) CAPSULE EACH  DAY 30 capsule 6  . simvastatin (ZOCOR) 40 MG tablet Take 1 tablet (40 mg total) by mouth once. 30 tablet 6   No current facility-administered medications for this visit.     Review of Systems General: positive for anorexia Skin: negative HEENT: negative Breasts: negative Pulmonary: negative Cardiac: negative Gastrointestinal: negative for, nausea, vomiting, pain, constipation, diarrhea, hematochezia Genitourinary/Sexual: retention Ob/Gyn: negative for, irregular bleeding, pain Musculoskeletal: pain back pain chronic Neurologic/Psych: anxiety  Objective:  Physical Examination:  BP 152/84 mmHg  Pulse 80  Temp(Src) 97.1 F (36.2 C) (Tympanic)  Resp 20  Wt 121 lb 7.6 oz (55.1 kg)   Weight decreased from 124 09/30/2015 to 121 on today's visit.   ECOG Performance Status: 1 - Symptomatic but completely ambulatory  General appearance: alert, cooperative and appears stated age HEENT:PERRLA and sclera clear, anicteric Lymph node survey: non-palpable, axillary, inguinal, supraclavicular  Abdomen: soft, non-tender, without masses or organomegaly, nondistended, no hernias and well healed incision. No ascites.  Extremities: extremities normal, atraumatic, no  cyanosis or edema Neurological exam reveals alert, oriented, normal speech, no focal findings or movement disorder noted. Very anxious  Pelvic: exam chaperoned by nurse, EGBUS within normal limits, normal vagina and vulva, cervix and uterus surgically absent;  Urethra normal. BME: ovaries not palpated, no masses, tenderness or lesions; Rectal: deferred patient has hemorrhoid.   Lab Review As above    Assessment:  Caitlin Mason is a 81 y.o. female diagnosed with elevated 123456 of uncertain etiology. Pelvic exam negative for pelvic masses or nodularity and decreased abdominal symptoms are reassuring. Loss of appetite nonspecific. Persistent weight loss. Abnormal left breast exam in setting of family history of breast cancer, reviewed by Dr. Fleet Contras and not felt to be concerning. History of thickened rectovaginal septum and sphincter, may be secondary to known internal hemorrhoid. Urinary retention.   Plan:   Problem List Items Addressed This Visit      Other   Elevated tumor markers - Primary   Relevant Orders   CA 125      Patient reassured by stable CA125 and normal HE4 values. There is no apparent gynecologic etiology for her elevated CA125. I recommended follow up in one year and reviewed signs/symptoms of ovarian cancer. If she has these symptom she will contact us.   I offered her a Urology consult for urinary retention symptoms. She declined. She will follow up with Dr. Derrel Nip regarding the urinary retention symptoms and weight issues.   The patient's diagnosis, an outline of the further diagnostic and laboratory studies which will be required, the recommendation, and alternatives were discussed.  All questions were answered to the patient's and her granddaughter's satisfaction. They agree with the plan.  We will contact her when the tumor marker results are available.   Gillis Ends, MD  CC: Dr. Forest Gleason  Crecencio Mc, MD [address]  [place], [place] [address] [phone]

## 2016-04-05 ENCOUNTER — Telehealth: Payer: Self-pay | Admitting: Internal Medicine

## 2016-04-05 NOTE — Telephone Encounter (Signed)
PLEASE NOTE: All timestamps contained within this report are represented as [place] Standard Time. CONFIDENTIALTY NOTICE: This fax transmission is intended only for the addressee. It contains information that is legally privileged, confidential or otherwise protected from use or disclosure. If you are not the intended recipient, you are strictly prohibited from reviewing, disclosing, copying using or disseminating any of this information or taking any action in reliance on or regarding this information. If you have received this fax in error, please notify us immediately by telephone so that we can arrange for its return to [place]. Phone: [phone], Toll-Free: [phone], Fax: [phone] Page: 1 of 1 Call Id: [id number] [place] Patient Name: Caitlin Mason DOB: 1935-07-21 Initial Comment caller states she has painful and freq urination Nurse Assessment Nurse: Marcelline Deist, RN, Kermit Balo Date/Time (Eastern Time): 04/05/2016 9:32:11 AM Confirm and document reason for call. If symptomatic, describe symptoms. You must click the next button to save text entered. ---Caller states she has painful and frequent urination, feeling bloated. Has pain on left side in lower front. Went recently to the cancer Dr. Did CA125 test & blood test to check for tumors. Feels like she is not emptying her bladder. Has the patient traveled out of the country within the last 30 days? ---Not Applicable Does the patient have any new or worsening symptoms? ---Yes Will a triage be completed? ---Yes Related visit to physician within the last 2 weeks? ---No Does the PT have any chronic conditions? (i.e. diabetes, asthma, etc.) ---Yes List chronic conditions. ---on BP rx, cholesterol rx, diverticulitis, possible false positive reading for cancer Is this a behavioral health or substance abuse call? ---No Guidelines Guideline  Title Affirmed Question Affirmed Notes Urination Pain - Female Age > 49 years Final Disposition User See Physician within [place], RN, Kermit Balo Comments If there any cancellations, please let caller know. There is no availability for her provider in the next 2 days. She states she has pain, not when she urinates as first reported, but in her lower left side and more of a pressure feeling with her bladder. Referrals REFERRED TO PCP OFFICE Disagree/Comply: Comply

## 2016-04-05 NOTE — Telephone Encounter (Signed)
She can drop off a urine and we will run a POCT UA<  Formal UA with micro  and culture

## 2016-04-05 NOTE — Telephone Encounter (Signed)
Spoke with the patient,   Patient feels like her bladder is straining and holding urine.  No Pain noted, no blood noted.   States that she starts to urinate and it is fine and when she is almost done she feels like she is having to strain to finish.   Advised that we have no appts for the next 2 days by [facility].    Can you offer suggestions?  Thanks

## 2016-04-05 NOTE — Telephone Encounter (Signed)
Attempted to call patient, unable to leave a VM. Thanks

## 2016-04-06 ENCOUNTER — Other Ambulatory Visit (INDEPENDENT_AMBULATORY_CARE_PROVIDER_SITE_OTHER): Payer: Medicare Other

## 2016-04-06 DIAGNOSIS — N390 Urinary tract infection, site not specified: Secondary | ICD-10-CM | POA: Diagnosis not present

## 2016-04-06 LAB — URINALYSIS, ROUTINE W REFLEX MICROSCOPIC
BILIRUBIN URINE: NEGATIVE
Hgb urine dipstick: NEGATIVE
KETONES UR: NEGATIVE
LEUKOCYTES UA: NEGATIVE
NITRITE: NEGATIVE
RBC / HPF: NONE SEEN (ref 0–?)
Specific Gravity, Urine: 1.015 (ref 1.000–1.030)
TOTAL PROTEIN, URINE-UPE24: NEGATIVE
Urine Glucose: NEGATIVE
Urobilinogen, UA: 0.2 (ref 0.0–1.0)
pH: 5.5 (ref 5.0–8.0)

## 2016-04-06 NOTE — Telephone Encounter (Signed)
Spoke with the patient she will stop by this afternoon and give a specimen. thanks

## 2016-04-08 NOTE — Telephone Encounter (Signed)
Lab called that they didn't have enough for a urine

## 2016-04-08 NOTE — Telephone Encounter (Signed)
Patient notified

## 2016-04-19 ENCOUNTER — Telehealth: Payer: Self-pay | Admitting: Internal Medicine

## 2016-04-19 ENCOUNTER — Encounter: Payer: Self-pay | Admitting: Family Medicine

## 2016-04-19 ENCOUNTER — Ambulatory Visit (INDEPENDENT_AMBULATORY_CARE_PROVIDER_SITE_OTHER): Payer: Medicare Other | Admitting: Family Medicine

## 2016-04-19 ENCOUNTER — Ambulatory Visit
Admission: RE | Admit: 2016-04-19 | Discharge: 2016-04-19 | Disposition: A | Discharge status: Home / Self Care | Payer: Medicare Other | Source: Ambulatory Visit | Attending: Family Medicine | Admitting: Family Medicine

## 2016-04-19 VITALS — BP 126/82 | HR 76 | Temp 97.9°F | Ht 60.0 in | Wt 123.2 lb

## 2016-04-19 DIAGNOSIS — R1030 Lower abdominal pain, unspecified: Secondary | ICD-10-CM | POA: Diagnosis not present

## 2016-04-19 NOTE — Telephone Encounter (Signed)
Talked with triage and with Dr. Derrel Nip verbal given to see MD here available.

## 2016-04-19 NOTE — Assessment & Plan Note (Signed)
New acute problem. Unclear etiology/prognosis. Given patient's age and history of elevated CA-125, sending for CT scan.  30 minutes were spent face-to-face with the patient during this encounter and over half of that time was spent discussing symptoms, recent CA-125/tumor marker results, and best course of action regarding abdominal pain.

## 2016-04-19 NOTE — Progress Notes (Signed)
Pre visit review using our clinic review tool, if applicable. No additional management support is needed unless otherwise documented below in the visit note. 

## 2016-04-19 NOTE — Progress Notes (Signed)
   Subjective:  Patient ID: Caitlin Mason, female    DOB: [birth year]  Age: 81 y.o. MRN: [id number]  CC: Abdominal pain  HPI:  81 year old female presents with complaints of abdominal pain.  Abdominal pain  Patient reports that she was severe lower abdominal pain this morning.  She states the pain was located bilaterally in the lower abdomen.  He states the pain was severe and then resolve spontaneously.  She has no pain at this time.  No associated nausea or vomiting. No diarrhea.  No UTI symptoms. No hematuria.  She reports good PO intake.  No fever, chills.  No known exacerbating or relieving factors.  Social Hx   Social History   Social History  . Marital Status: Widowed    Spouse Name: N/A  . Number of Children: N/A  . Years of Education: N/A   Social History Main Topics  . Smoking status: Former Smoker -- 1.00 packs/day for 25 years    Types: Cigarettes    Quit date: 08/15/1985  . Smokeless tobacco: Never Used     Comment: quit 20 years ago -started at age 68  . Alcohol Use: No  . Drug Use: No  . Sexual Activity: Not Asked   Other Topics Concern  . None   Social History Narrative   Widowed, lives in [place], gets regular exercise by working in the yard.    Review of Systems  Constitutional: Negative.   Gastrointestinal: Positive for abdominal pain.   Objective:  BP 126/82 mmHg  Pulse 76  Temp(Src) 97.9 F (36.6 C) (Oral)  Ht 5' (1.524 m)  Wt 123 lb 4 oz (55.906 kg)  BMI 24.07 kg/m2  SpO2 93%  BP/Weight 04/19/2016 03/30/2016 0000000  Systolic BP 123XX123 0000000 123XX123  Diastolic BP 82 84 96  Wt. (Lbs) 123.25 121.47 124.12  BMI 24.07 23.72 24.24   Physical Exam  Constitutional: She appears well-developed. No distress.  Cardiovascular: Normal rate and regular rhythm.   Pulmonary/Chest: Effort normal. She has no wheezes. She has no rales.  Abdominal: Soft.  Mild periumbilical tenderness. No rebound or guarding.  Neurological: She is alert.    Psychiatric: She has a normal mood and affect.  Vitals reviewed.   Lab Results  Component Value Date   WBC 6.3 04/13/2015   HGB 12.7 04/13/2015   HCT 38.0 04/13/2015   PLT 222.0 04/13/2015   GLUCOSE 89 05/19/2015   CHOL 206* 05/19/2015   TRIG 127.0 05/19/2015   HDL 64.70 05/19/2015   LDLDIRECT 131.2 10/16/2013   LDLCALC 116* 05/19/2015   ALT 9 05/19/2015   AST 20 05/19/2015   NA 137 05/19/2015   K 4.5 05/19/2015   CL 101 05/19/2015   CREATININE 1.08 05/19/2015   BUN 17 05/19/2015   CO2 32 05/19/2015   TSH 1.24 04/13/2015   Assessment & Plan:   Problem List Items Addressed This Visit    Lower abdominal pain - Primary    New acute problem. Unclear etiology/prognosis. Given patient's age and history of elevated CA-125, sending for CT scan.  30 minutes were spent face-to-face with the patient during this encounter and over half of that time was spent discussing symptoms, recent CA-125/tumor marker results, and best course of action regarding abdominal pain.       Relevant Orders   CT Abdomen Pelvis W Contrast   BUN   Creatinine, IStat     Follow-up: PR[facility]  [place]

## 2016-04-19 NOTE — Telephone Encounter (Signed)
Patient Name: Caitlin Mason  DOB: August 03, 1935    Initial Comment Caller states had a urine test 2 weeks ago but is hurting on both sides on the front. concerned she may be passing a kidney stone    Nurse Assessment  Nurse: Thad Ranger, RN, Langley Gauss Date/Time (Eastern Time): 04/19/2016 2:17:24 PM  Confirm and document reason for call. If symptomatic, describe symptoms. You must click the next button to save text entered. ---Caller states had a urine test 2 weeks ago but is hurting on both sides in the lower abdomen. Feels like her bladder is not emptying.  Has the patient traveled out of the country within the last 30 days? ---Not Applicable  Does the patient have any new or worsening symptoms? ---Yes  Will a triage be completed? ---Yes  Related visit to physician within the last 2 weeks? ---Yes  Does the PT have any chronic conditions? (i.e. diabetes, asthma, etc.) ---Yes  List chronic conditions. ---Macular Degeneration  Is this a behavioral health or substance abuse call? ---No     Guidelines    Guideline Title Affirmed Question Affirmed Notes  Abdominal Pain - Female [1] SEVERE pain (e.g., excruciating) AND [2] present > 1 hour    Final Disposition User   Go to ED Now [place], RN, Langley Gauss    Comments  Pt refused to go to the ER for eval and wants to be seen by any MD avail to see her in the office today. Advised I have to call the [facility] for permission to make an appt due to she was triaged to ER. Advised will cb.  Called [facility] and spoke to [place], Dr Lupita Dawn office nurse. Pt report given and VO received per Dr Derrel Nip, may downgrade to be eval in [facility] today with MD that is available (Dr Thersa Salt). Appt made today, 04/19/16 at 4:00 pm. Pt aware/agreeable with POC.  Pt had previously stated she will take any appt avail today. Advised appt made for 4pm today with Dr Thersa Salt.   Referrals  GO TO FACILITY REFUSED   Disagree/Comply: Disagree  Disagree/Comply Reason: Disagree with instructions

## 2016-04-20 ENCOUNTER — Telehealth: Payer: Self-pay

## 2016-04-20 ENCOUNTER — Ambulatory Visit
Admission: RE | Admit: 2016-04-20 | Discharge: 2016-04-20 | Disposition: A | Discharge status: Home / Self Care | Payer: Medicare Other | Source: Ambulatory Visit | Attending: Family Medicine | Admitting: Family Medicine

## 2016-04-20 DIAGNOSIS — M5136 Other intervertebral disc degeneration, lumbar region: Secondary | ICD-10-CM | POA: Insufficient documentation

## 2016-04-20 DIAGNOSIS — R1030 Lower abdominal pain, unspecified: Secondary | ICD-10-CM | POA: Insufficient documentation

## 2016-04-20 DIAGNOSIS — I700 Atherosclerosis of aorta: Secondary | ICD-10-CM | POA: Diagnosis not present

## 2016-04-20 DIAGNOSIS — M4186 Other forms of scoliosis, lumbar region: Secondary | ICD-10-CM | POA: Insufficient documentation

## 2016-04-20 DIAGNOSIS — N289 Disorder of kidney and ureter, unspecified: Secondary | ICD-10-CM | POA: Diagnosis not present

## 2016-04-20 LAB — POCT I-STAT CREATININE: Creatinine, Ser: 0.8 mg/dL (ref 0.44–1.00)

## 2016-04-20 MED ORDER — IOPAMIDOL (ISOVUE-300) INJECTION 61%
100.0000 mL | Freq: Once | INTRAVENOUS | Status: AC | PRN
Start: 2016-04-20 — End: 2016-04-20
  Administered 2016-04-20: 100 mL via INTRAVENOUS

## 2016-04-20 NOTE — Telephone Encounter (Signed)
[facility] called to make sure you have seen this report.  CT of Abdomen and Pelvis  1. No acute findings within the abdomen or pelvis. 2. There is an indeterminate hyperdense lesion arising from the posterior aspect of the left kidney measuring 7 mm. This is unchanged in size when compared with 04/22/2015. Recommend a followup examination in 12 months. The study of choice at followup would be a renal protocol MRI. 3. Aortic atherosclerosis. 4. Scoliosis and degenerative disc disease.  Please advise.

## 2016-04-20 NOTE — Telephone Encounter (Signed)
Have report. Will inform patient.

## 2016-05-17 ENCOUNTER — Telehealth: Payer: Self-pay | Admitting: *Deleted

## 2016-05-17 NOTE — Telephone Encounter (Signed)
Dr. Derrel Nip does not do hip injections.

## 2016-05-17 NOTE — Telephone Encounter (Signed)
Please advise a time and date? Thanks

## 2016-05-17 NOTE — Telephone Encounter (Signed)
Spoke with the patient, she has had back pain for years due to an injury on her riding mower.  She has refused surgery in the past and recently she has had increased pain in her left hip.  She is not sure if it is nerve related or if two bones are rubbing inside.  Left hip hurts so bad that she is unable to sleep and wants an injection in her hip. I advised her that you don't do hip injections, she wanted to know other options.  She doesn't want Physical therapy or muscle relaxers, stated that they jsut make you sleepy and don't help.  Please advise?

## 2016-05-17 NOTE — Telephone Encounter (Signed)
She needs to see an orthopedist who will x ray her hip and give her an injection .  Who does she want to see

## 2016-05-17 NOTE — Telephone Encounter (Signed)
Spoke with the patient, she is wanting to go to a specific doctor she will call me back with there name in the next day or so. thanks

## 2016-05-17 NOTE — Telephone Encounter (Signed)
Patient is currently having left hip pain since Sunday. She would like a place on Dr. Demetrios Isaacs  Schedule on Thursday  to receive a injection for pain. Please advise a time  [id number]

## 2016-05-27 ENCOUNTER — Other Ambulatory Visit: Payer: Self-pay | Admitting: Internal Medicine

## 2016-05-27 ENCOUNTER — Telehealth: Payer: Self-pay | Admitting: *Deleted

## 2016-05-27 MED ORDER — ALPRAZOLAM 0.5 MG PO TABS: ORAL_TABLET | ORAL | Status: DC

## 2016-05-27 NOTE — Telephone Encounter (Signed)
[facility] [place], [place] [phone] or fax [phone]  Refill request ALPRAZolam Caitlin Mason) 0.5 MG tablet  Last office visit: 04/19/16 with Dr Lacinda Axon, 05/20/15 with Dr Derrel Nip Last fill: 04/28/16 No future appointments scheduled.  Okay to refill?

## 2016-05-27 NOTE — Telephone Encounter (Signed)
Patient scheduled.

## 2016-05-27 NOTE — Telephone Encounter (Signed)
Refill for 30 days only.  OFFICE VISIT NEEDED prior to any more refills 

## 2016-06-06 ENCOUNTER — Encounter: Payer: Self-pay | Admitting: Internal Medicine

## 2016-06-06 ENCOUNTER — Ambulatory Visit (INDEPENDENT_AMBULATORY_CARE_PROVIDER_SITE_OTHER): Payer: Medicare Other | Admitting: Internal Medicine

## 2016-06-06 VITALS — BP 156/80 | HR 79 | Temp 98.2°F | Resp 12 | Ht 60.0 in | Wt 118.2 lb

## 2016-06-06 DIAGNOSIS — H35351 Cystoid macular degeneration, right eye: Secondary | ICD-10-CM

## 2016-06-06 DIAGNOSIS — E785 Hyperlipidemia, unspecified: Secondary | ICD-10-CM | POA: Diagnosis not present

## 2016-06-06 DIAGNOSIS — L850 Acquired ichthyosis: Secondary | ICD-10-CM

## 2016-06-06 DIAGNOSIS — M5442 Lumbago with sciatica, left side: Secondary | ICD-10-CM | POA: Diagnosis not present

## 2016-06-06 DIAGNOSIS — R5383 Other fatigue: Secondary | ICD-10-CM

## 2016-06-06 DIAGNOSIS — G8929 Other chronic pain: Secondary | ICD-10-CM

## 2016-06-06 DIAGNOSIS — G4700 Insomnia, unspecified: Secondary | ICD-10-CM

## 2016-06-06 DIAGNOSIS — I10 Essential (primary) hypertension: Secondary | ICD-10-CM

## 2016-06-06 DIAGNOSIS — R971 Elevated cancer antigen 125 [CA 125]: Secondary | ICD-10-CM | POA: Diagnosis not present

## 2016-06-06 DIAGNOSIS — L853 Xerosis cutis: Secondary | ICD-10-CM

## 2016-06-06 LAB — COMPREHENSIVE METABOLIC PANEL
ALBUMIN: 3.9 g/dL (ref 3.5–5.2)
ALT: 7 U/L (ref 0–35)
AST: 17 U/L (ref 0–37)
Alkaline Phosphatase: 51 U/L (ref 39–117)
BUN: 12 mg/dL (ref 6–23)
CALCIUM: 9.3 mg/dL (ref 8.4–10.5)
CHLORIDE: 101 meq/L (ref 96–112)
CO2: 26 mEq/L (ref 19–32)
CREATININE: 0.8 mg/dL (ref 0.40–1.20)
GFR: 73.46 mL/min (ref >60.00)
Glucose, Bld: 84 mg/dL (ref 70–99)
Potassium: 4.5 mEq/L (ref 3.5–5.1)
Sodium: 136 mEq/L (ref 135–145)
Total Bilirubin: 0.4 mg/dL (ref 0.2–1.2)
Total Protein: 6.2 g/dL (ref 6.0–8.3)

## 2016-06-06 LAB — TSH: TSH: 0.7 u[IU]/mL (ref 0.35–4.50)

## 2016-06-06 LAB — CBC WITH DIFFERENTIAL/PLATELET
Basophils Absolute: 0 10*3/uL (ref 0.0–0.1)
Basophils Relative: 0.3 % (ref 0.0–3.0)
EOS ABS: 0 10*3/uL (ref 0.0–0.7)
EOS PCT: 1.1 % (ref 0.0–5.0)
HCT: 38 % (ref 36.0–46.0)
HEMOGLOBIN: 12.8 g/dL (ref 12.0–15.0)
LYMPHS PCT: 20.1 % (ref 12.0–46.0)
Lymphs Abs: 0.8 10*3/uL (ref 0.7–4.0)
MCHC: 33.5 g/dL (ref 30.0–36.0)
MCV: 86.4 fl (ref 78.0–100.0)
MONO ABS: 0.5 10*3/uL (ref 0.1–1.0)
Monocytes Relative: 11.3 % (ref 3.0–12.0)
Neutro Abs: 2.7 10*3/uL (ref 1.4–7.7)
Neutrophils Relative %: 67.2 % (ref 43.0–77.0)
Platelets: 193 10*3/uL (ref 150.0–400.0)
RBC: 4.4 Mil/uL (ref 3.87–5.11)
RDW: 14.4 % (ref 11.5–15.5)
WBC: 4 10*3/uL (ref 4.0–10.5)

## 2016-06-06 LAB — LIPID PANEL
CHOLESTEROL: 182 mg/dL (ref 0–200)
HDL: 71.7 mg/dL (ref >39.00)
LDL CALC: 88 mg/dL (ref 0–99)
NonHDL: 110.44
TRIGLYCERIDES: 112 mg/dL (ref 0.0–149.0)
Total CHOL/HDL Ratio: 3
VLDL: 22.4 mg/dL (ref 0.0–40.0)

## 2016-06-06 MED ORDER — ALPRAZOLAM 0.5 MG PO TABS: ORAL_TABLET | ORAL | Status: DC

## 2016-06-06 NOTE — Patient Instructions (Signed)
If you decide you want to see Dr Bary Castilla,  Let me know

## 2016-06-06 NOTE — Progress Notes (Signed)
Subjective:  Patient ID: Caitlin Mason, female    DOB: [birth year]  Age: 81 y.o. MRN: [id number]  CC: The primary encounter diagnosis was Other fatigue. Diagnoses of Hyperlipidemia, Elevated CA-125, Chronic left-sided low back pain with left-sided sciatica, Insomnia, persistent, Essential hypertension, benign, Cystoid macular degeneration of right eye, and Dry skin dermatitis were also pertinent to this visit.  HPI Caitlin Mason presents for follow up on multiple issues,  Last seen by me in June 2016 for wellness visit  1) She was evaluated by  Dr Marsa Aris May for bilateral  lower quadrant abdominal pain in the setting of a persistently elevated CA 125 and history of diverticulosis.  CT of abd pelvis was repeated.  No new findings,  Specifically No evidence of diverticulitis,  Nephrolithiasis or ovarian mass.  Incidental finding of a persistent 7 mm left kidney mass, with recommended follow up imaging  in 12 months with a renal protocol MRI  .  2) Bladder issues. She reports difficulty  Completely emptying her bladder unless she strains for the last part. Denies incontinence and hematuria.   3) she has been treating a persistent "rash" on her  right upper back for 6 months with hydrocortisone.  She cannot see the area she is treating.   4) Chronic low back pain.  She reports persistent mild radiculopathy involving the left leg since 2013,  Feels tingly,  Not extremely painful. No foot drop.  Not using narcotics   5) Anorexia improving slowly.  Has had a unintentional weight loss of 10 lbs since June 2016.  She feels that her symptomsm of anxiety and depression have imporved siginificantly with prayer and regular Bible study.   6) Insomnia: managed with use of alprazolam every night,  Some nights the xanax doesn't work,  But if she adds a  Goody's powder she sleeps better.   7) Persistently elevated CA 125.  She was sent  to Oncology last year after CA 125 , done for evaluation of vague  abdominal pain, was elevated.  Abd CT failed to show diverticulitis or ovarian mass.  She was evaluated by Dr. Oliva Bustard and then referred to Dr. Theora Gianotti for a pelvic exam .  An apparently abnormal  breast exam resulted in a referral to [facility] in August 2016. [facility] found no evidence of breast mass,  But given her weight loss and elevated CA 125, offered diagnostic laparoscopy.  Patient is frustrated because her oncologists disagreed with Dr. Bary Castilla about the utility of the procedure.  However her CA 125 is checked  quarterly and remains elevated,  Yet  repeat noninvasive imaging fails to show a mass and  no alternative diagnosis has been offered to the patient.   She would like my opinion on what to do. She understands the risk involved with surgery.   Outpatient Prescriptions Prior to Visit  Medication Sig Dispense Refill  . albuterol (PROAIR HFA) 108 (90 BASE) MCG/ACT inhaler Inhale 2 puffs into the lungs every 6 (six) hours as needed for wheezing or shortness of breath. 6.7 g 3  . aspirin 81 MG EC tablet Take 81 mg by mouth daily.      . cholecalciferol (VITAMIN D) 1000 UNITS tablet Take 1,000 Units by mouth daily.    . enalapril (VASOTEC) 5 MG tablet TAKE 1 TABLET BY MOUTH EACH DAY 30 tablet 6  . fexofenadine (ALLEGRA) 180 MG tablet Take 1 tablet (180 mg total) by mouth daily. 90 tablet 1  . omeprazole (PRILOSEC) 40  MG capsule TAKE ONE (1) CAPSULE EACH DAY 30 capsule 6  . simvastatin (ZOCOR) 40 MG tablet Take 1 tablet (40 mg total) by mouth once. 30 tablet 6  . ALPRAZolam (XANAX) 0.5 MG tablet TAKE ONE TABLET BY MOUTH AT BEDTIME AS NEEDED ANXIETY 30 tablet 0   No facility-administered medications prior to visit.    Review of Systems;  Patient denies headache, fevers, malaise, unintentional weight loss, skin rash, eye pain, sinus congestion and sinus pain, sore throat, dysphagia,  hemoptysis , cough, dyspnea, wheezing, chest pain, palpitations, orthopnea, edema, abdominal pain, nausea,  melena, diarrhea, constipation, flank pain, dysuria, hematuria, urinary  Frequency, nocturia, numbness, tingling, seizures,  Focal weakness, Loss of consciousness,  Tremor, insomnia, depression, anxiety, and suicidal ideation.      Objective:  BP 156/80 mmHg  Pulse 79  Temp(Src) 98.2 F (36.8 C) (Oral)  Resp 12  Ht 5' (1.524 m)  Wt 118 lb 4 oz (53.638 kg)  BMI 23.09 kg/m2  SpO2 97%  BP Readings from Last 3 Encounters:  06/06/16 156/80  04/19/16 126/82  03/30/16 152/84    Wt Readings from Last 3 Encounters:  06/06/16 118 lb 4 oz (53.638 kg)  04/19/16 123 lb 4 oz (55.906 kg)  03/30/16 121 lb 7.6 oz (55.1 kg)    General appearance: alert, cooperative and appears stated age Ears: normal TM's and external ear canals both ears Throat: lips, mucosa, and tongue normal; teeth and gums normal Neck: no adenopathy, no carotid bruit, supple, symmetrical, trachea midline and thyroid not enlarged, symmetric, no tenderness/mass/nodules Back: symmetric, no curvature. ROM normal. No CVA tenderness. Lungs: clear to auscultation bilaterally Heart: regular rate and rhythm, S1, S2 normal, no murmur, click, rub or gallop Abdomen: soft, non-tender; bowel sounds normal; no masses,  no organomegaly Pulses: 2+ and symmetric Skin: right shoulder blade without rash.  Dry flaking skin noted.  Lymph nodes: Cervical, supraclavicular, and axillary nodes normal.  No results found for: HGBA1C  Lab Results  Component Value Date   CREATININE 0.80 06/06/2016   CREATININE 0.80 04/20/2016   CREATININE 1.08 05/19/2015    Lab Results  Component Value Date   WBC 4.0 06/06/2016   HGB 12.8 06/06/2016   HCT 38.0 06/06/2016   PLT 193.0 06/06/2016   GLUCOSE 84 06/06/2016   CHOL 182 06/06/2016   TRIG 112.0 06/06/2016   HDL 71.70 06/06/2016   LDLDIRECT 131.2 10/16/2013   LDLCALC 88 06/06/2016   ALT 7 06/06/2016   AST 17 06/06/2016   NA 136 06/06/2016   K 4.5 06/06/2016   CL 101 06/06/2016   CREATININE  0.80 06/06/2016   BUN 12 06/06/2016   CO2 26 06/06/2016   TSH 0.70 06/06/2016    Ct Abdomen Pelvis W Contrast  04/20/2016  CLINICAL DATA:  Lower abdominal pain for 2 weeks. EXAM: CT ABDOMEN AND PELVIS WITH CONTRAST TECHNIQUE: Multidetector CT imaging of the abdomen and pelvis was performed using the standard protocol following bolus administration of intravenous contrast. CONTRAST:  163mL ISOVUE-300 IOPAMIDOL (ISOVUE-300) INJECTION 61% COMPARISON:  04/22/2015 FINDINGS: Lower chest:  No acute findings. Hepatobiliary: No masses or other significant abnormality. Pancreas: No mass, inflammatory changes, or other significant abnormality. Spleen: Within normal limits in size and appearance. Adrenals/Urinary Tract: Normal adrenal glands. There is bilateral renal cortical volume loss identified from both kidneys. Arising from the posterior cortex of the left kidney is small slightly exophytic hyperdense structure which measures 7 mm and 65 HU, image 15 of series 2. This is too small  to reliably characterize but is unchanged from 04/22/2015. The urinary bladder is normal. Stomach/Bowel: The stomach is within normal limits. The small bowel loops have a normal course and caliber. No obstruction. Numerous distal colonic diverticula identified without acute inflammation. Vascular/Lymphatic: Calcified atherosclerotic disease involves the abdominal aorta. No aneurysm. No enlarged retroperitoneal or mesenteric adenopathy. No enlarged pelvic or inguinal lymph nodes. Reproductive: Previous hysterectomy.  No adnexal mass. Other: No free fluid or fluid collections. Musculoskeletal: Scoliosis deformity is identified. There is multi level degenerative disc disease identified within the lumbar spine. Anterolisthesis of L4 on L5 is noted. IMPRESSION: 1. No acute findings within the abdomen or pelvis. 2. There is an indeterminate hyperdense lesion arising from the posterior aspect of the left kidney measuring 7 mm. This is unchanged  in size when compared with 04/22/2015. Recommend a followup examination in 12 months. The study of choice at followup would be a renal protocol MRI. 3. Aortic atherosclerosis. 4. Scoliosis and degenerative disc disease. Electronically Signed   By: Kerby Moors M.D.   On: 04/20/2016 11:33    Assessment & Plan:   Problem List Items Addressed This Visit    Insomnia, persistent    Aggravated by anxiety over estrangement from daughter and unclear diagnostic workup of ele vated CA 125.  Continue prn alprazolam and goody's powders.  The risks and benefits of benzodiazepine use were reviewed  with patient today including excessive sedation leading to respiratory depression,  impaired thinking/driving, and addiction.  Patient was advised to avoid concurrent use with alcohol, to use medication only as needed and not to share with others  .          Cystoid macular degeneration of right eye    Receiving treatment with intraocular injections.  She continues to live independently and drive but avoids driving at night.  No recent traffic accidents or falls that can be attributed to her vision issues      Chronic low back pain with left-sided sciatica    Secondary to scoliosis and DDD.  She has mild radicular symptoms involving the left leg currently without objective weakness.        Essential hypertension, benign    Well controlled on current regimen. Renal function stable, no changes today.  Lab Results  Component Value Date   CREATININE 0.80 06/06/2016   Lab Results  Component Value Date   NA 136 06/06/2016   K 4.5 06/06/2016   CL 101 06/06/2016   CO2 26 06/06/2016         Elevated CA-125    Persistent for the past year accompanied by vague lower abdominal pain and 10 lb weight loss in one year. She is frustrated by the lack of alternative explanation and her quality of life is diminished by the constant anxiety over a potentially undetected cancer. .  Given her otherwise very good  health , I agree with pursuing a diagnostic laparoscopy offered by Dr Bary Castilla.  Patient will discuss with her daughters and let me know if referral to [facility] is requested.      Dry skin dermatitis    recommended suspending use of steroid cream and change to moisturizng soaps and lotions.        Other Visit Diagnoses    Other fatigue    -  Primary    Relevant Orders    Comprehensive metabolic panel (Completed)    TSH (Completed)    CBC with Differential/Platelet (Completed)    Hyperlipidemia  Relevant Orders    Lipid panel (Completed)     A total of 40 minutes was spent with patient more than half of which was spent in counseling patient on the above mentioned issues , reviewing and explaining recent labs and imaging studies done, and coordination of care.  I am having Ms. Quentin Ore maintain her aspirin, cholecalciferol, albuterol, simvastatin, omeprazole, enalapril, fexofenadine, and ALPRAZolam.  Meds ordered this encounter  Medications  . ALPRAZolam (XANAX) 0.5 MG tablet    Sig: TAKE ONE TABLET BY MOUTH AT BEDTIME AS NEEDED ANXIETY    Dispense:  30 tablet    Refill:  5    Medications Discontinued During This Encounter  Medication Reason  . ALPRAZolam (XANAX) 0.5 MG tablet Reorder    Follow-up: No Follow-up on file.   Crecencio Mc, MD

## 2016-06-06 NOTE — Progress Notes (Signed)
Pre-visit discussion using our clinic review tool. No additional management support is needed unless otherwise documented below in the visit note.  

## 2016-06-07 ENCOUNTER — Telehealth: Payer: Self-pay | Admitting: Internal Medicine

## 2016-06-07 DIAGNOSIS — R971 Elevated cancer antigen 125 [CA 125]: Secondary | ICD-10-CM

## 2016-06-07 DIAGNOSIS — L853 Xerosis cutis: Secondary | ICD-10-CM | POA: Insufficient documentation

## 2016-06-07 NOTE — Assessment & Plan Note (Signed)
Aggravated by anxiety over estrangement from daughter and unclear diagnostic workup of ele vated CA 125.  Continue prn alprazolam and goody's powders.  The risks and benefits of benzodiazepine use were reviewed  with patient today including excessive sedation leading to respiratory depression,  impaired thinking/driving, and addiction.  Patient was advised to avoid concurrent use with alcohol, to use medication only as needed and not to share with others  .

## 2016-06-07 NOTE — Telephone Encounter (Signed)
Pt called stating she was told to call and inform Dr Derrel Nip to go ahead to put the referral in to Dr Tollie Pizza. Shots in eyes on 07/14 and 07/24. Thank you!  Call pt @ [phone].

## 2016-06-07 NOTE — Assessment & Plan Note (Signed)
Secondary to scoliosis and DDD.  She has mild radicular symptoms involving the left leg currently without objective weakness.

## 2016-06-07 NOTE — Telephone Encounter (Signed)
Please advise 

## 2016-06-07 NOTE — Assessment & Plan Note (Signed)
Well controlled on current regimen. Renal function stable, no changes today.  Lab Results  Component Value Date   CREATININE 0.80 06/06/2016   Lab Results  Component Value Date   NA 136 06/06/2016   K 4.5 06/06/2016   CL 101 06/06/2016   CO2 26 06/06/2016

## 2016-06-07 NOTE — Assessment & Plan Note (Signed)
recommended suspending use of steroid cream and change to moisturizng soaps and lotions.

## 2016-06-07 NOTE — Assessment & Plan Note (Signed)
Receiving treatment with intraocular injections.  She continues to live independently and drive but avoids driving at night.  No recent traffic accidents or falls that can be attributed to her vision issues

## 2016-06-07 NOTE — Telephone Encounter (Signed)
Referral is in process as requested 

## 2016-06-07 NOTE — Assessment & Plan Note (Addendum)
Persistent for the past year accompanied by vague lower abdominal pain and 10 lb weight loss in one year. She is frustrated by the lack of alternative explanation and her quality of life is diminished by the constant anxiety over a potentially undetected cancer. .  Given her otherwise very good health , I agree with pursuing a diagnostic laparoscopy offered by Dr Bary Castilla.  Patient will discuss with her daughters and let me know if referral to [facility] is requested.

## 2016-06-08 ENCOUNTER — Encounter: Payer: Self-pay | Admitting: General Surgery

## 2016-06-08 ENCOUNTER — Encounter: Payer: Self-pay | Admitting: *Deleted

## 2016-06-09 ENCOUNTER — Ambulatory Visit (INDEPENDENT_AMBULATORY_CARE_PROVIDER_SITE_OTHER): Payer: Medicare Other | Admitting: General Surgery

## 2016-06-09 ENCOUNTER — Encounter: Payer: Self-pay | Admitting: General Surgery

## 2016-06-09 VITALS — BP 118/62 | HR 74 | Resp 16 | Ht 61.0 in | Wt 116.0 lb

## 2016-06-09 DIAGNOSIS — R634 Abnormal weight loss: Secondary | ICD-10-CM

## 2016-06-09 DIAGNOSIS — R630 Anorexia: Secondary | ICD-10-CM | POA: Diagnosis not present

## 2016-06-09 NOTE — Progress Notes (Signed)
Patient ID: Caitlin Mason, female   DOB: Jan 29, 1935, 81 y.o.   MRN: [id number]  Chief Complaint  Patient presents with  . Other    elevated labs    HPI Caitlin Mason is a 81 y.o. female here today for elevated CA 125 labs. She denies any gastrointestinal issues. Bowels move daily and no bleeding. She states she has no appetite and has lost some weight.  She has lost 7 pounds since May 2017. She states her appetite changed several months ago but she still makes herself eat. She did see Dr Oliva Bustard July 2016 and Dr [facility] October 2016 and Dr Theora Gianotti April 2017. Denies any balance issues. CT abdominal and pelvis was 04-20-16.  HPI  Past Medical History  Diagnosis Date  . Cholelithiasis     asymptomatic  . Osteoporosis   . Plantar fasciitis     Morton's Neuroma  . Actinic keratosis     back, upper extremities: s/p liquid nitro 01/29/06  . Diverticulosis   . HLD (hyperlipidemia)   . HTN (hypertension)   . Depression   . Low back pain   . 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 COPD (chronic obstructive pulmonary disease) ([place])   . Cataract     s/p extraction 2007, 2005    Past Surgical History  Procedure Laterality Date  . Colonoscopy      3/09, one sigmoid polyp; no tics   . Vesicovaginal fistula closure w/ tah    . Abdominal hysterectomy    . Nm myoview ltd  Oct 2011    normal, EF 89%, study limited by lung disease  . Breast biopsy Bilateral     surgical bx    Family History  Problem Relation Age of Onset  . Breast cancer Mother 72  . Lung cancer Maternal Uncle   . Gallbladder disease Maternal Aunt   . Aneurysm Father     Social History Social History  Substance Use Topics  . Smoking status: Former Smoker -- 1.00 packs/day for 25 years    Types: Cigarettes    Quit date: 08/15/1985  . Smokeless tobacco: Never Used     Comment: quit 20 years ago -started at age 10  . Alcohol Use: No    Allergies  Allergen  Reactions  . Codeine     REACTION: giup set  . Demerol Nausea And Vomiting  . Hctz [Hydrochlorothiazide] Other (See Comments)    Hyponatremia   . Levaquin [Levofloxacin In D5w] Nausea And Vomiting  . Propoxyphene N-Acetaminophen     REACTION: gi upset  . Sulfonamide Derivatives     REACTION: gi upset    Current Outpatient Prescriptions  Medication Sig Dispense Refill  . albuterol (PROAIR HFA) 108 (90 BASE) MCG/ACT inhaler Inhale 2 puffs into the lungs every 6 (six) hours as needed for wheezing or shortness of breath. 6.7 g 3  . ALPRAZolam (XANAX) 0.5 MG tablet TAKE ONE TABLET BY MOUTH AT BEDTIME AS NEEDED ANXIETY 30 tablet 5  . aspirin 81 MG EC tablet Take 81 mg by mouth daily.      . cholecalciferol (VITAMIN D) 1000 UNITS tablet Take 1,000 Units by mouth daily.    . enalapril (VASOTEC) 5 MG tablet TAKE 1 TABLET BY MOUTH EACH DAY 30 tablet 6  . fexofenadine (Mason) 180 MG tablet Take 1 tablet (180 mg total) by mouth daily. 90 tablet 1  . omeprazole (PRILOSEC) 40 MG capsule TAKE ONE (1)  CAPSULE EACH DAY 30 capsule 6  . simvastatin (ZOCOR) 40 MG tablet Take 1 tablet (40 mg total) by mouth once. 30 tablet 6   No current facility-administered medications for this visit.    Review of Systems Review of Systems  Constitutional: Positive for appetite change.  Eyes: Negative.   Respiratory: Negative.   Cardiovascular: Negative.   Gastrointestinal: Negative for nausea, vomiting, abdominal pain, diarrhea and constipation.  Endocrine: Negative.   Genitourinary: Negative.   Allergic/Immunologic: Negative.   Neurological: Negative.   Hematological: Negative.   Psychiatric/Behavioral: Negative.     Blood pressure 118/62, pulse 74, resp. rate 16, height 5\' 1"  (1.549 m), weight 116 lb (52.617 kg).  Physical Exam Physical Exam  Constitutional: She is oriented to person, place, and time. She appears well-developed and well-nourished.  HENT:  Mouth/Throat: Oropharynx is clear and  moist.  Eyes: Conjunctivae are normal. No scleral icterus.  Neck: Neck supple.  Cardiovascular: Normal rate and regular rhythm.   Pulses:      Femoral pulses are 2+ on the right side, and 2+ on the left side.      Posterior tibial pulses are 2+ on the right side, and 2+ on the left side.  No lower leg edema.  Pulmonary/Chest: Effort normal and breath sounds normal.  Abdominal: Soft. There is no tenderness.  Well healed mid line abdominal incision.  Musculoskeletal: Normal range of motion.  Scoliosis present.   Lymphadenopathy:    She has no cervical adenopathy.    She has no axillary adenopathy.       Right: No inguinal and no supraclavicular adenopathy present.       Left: No inguinal and no supraclavicular adenopathy present.  Neurological: She is alert and oriented to person, place, and time. She has normal strength and normal reflexes. No cranial nerve deficit. Coordination and gait normal.  Reflex Scores:      Tricep reflexes are 2+ on the right side and 2+ on the left side.      Bicep reflexes are 2+ on the left side.      Patellar reflexes are 2+ on the right side and 2+ on the left side. Skin: Skin is warm and dry.  Psychiatric: Her behavior is normal.    Data Reviewed Spring 2017 CT reviewed. No evidence of an intra-abdominal lesion noted. No ascites.  GYN oncology notes from shrink 2017 reviewed.  Most recent CEA 125 down from a peak of 01/09/1989.  Patient has lost 8 pounds over the past year.  Assessment    Loss of appetite without any associated symptoms.  Based on previous discussion with GYN oncology exceptionally low risk for occult ovarian cancer.  Tobacco usage below 60-pack-year history required for screening low-dose CT scan of the chest.    Plan    I do not get a sense of depression in speaking to the patient. She's been a widow since 1988 and continues to cook her meals daily at home.  While I had briefly considered diagnostic laparoscopy, the  patient reports no dietary intolerance no change in bowel habits and has a totally benign abdominal and radiologic exam. Her main issue is a loss of appetite, rather dietary intolerance. He can find no defects on her neurologic exam to suggest an intracranial lesion. At this time I believe the risk of diagnostic laparoscopy outweighs the risk of a missed malignancy in the intra-abdominal cavity.       PCP:  Deborra Medina  This information has been scribed by  Karie Fetch RN, BSN,BC. Marland Kitchen    Robert Bellow 06/09/2016, 10:00 PM

## 2016-06-09 NOTE — Patient Instructions (Signed)
The patient is aware to call back for any questions or concerns.  

## 2016-06-27 ENCOUNTER — Telehealth: Payer: Self-pay | Admitting: General Surgery

## 2016-06-27 NOTE — Telephone Encounter (Signed)
Case reviewed with PCP. No plans for diagnostic laparoscopy at this time. Risk far outweighs the potential benefits. Patient is comfortable with this plan.

## 2016-06-27 NOTE — Telephone Encounter (Signed)
PT CALLED TODAY STATING SHE WAS SEEN BY DR BYRNETT ON 06-09-16.HE WAS TO CALL HER AFTER A WEEK TO LET HER KNOW IF SHE SHOULD HAVE A DX LAPAROSCOPY. PLEASE CALL PT @ [phone].IF PT DOESN/T HEAR BY Thursday SHE WILL CALL BACK.Marland Kitchen

## 2016-06-28 ENCOUNTER — Ambulatory Visit: Payer: Medicare Other | Admitting: General Surgery

## 2016-07-06 ENCOUNTER — Other Ambulatory Visit: Payer: Self-pay | Admitting: Internal Medicine

## 2016-07-18 ENCOUNTER — Other Ambulatory Visit: Payer: Self-pay | Admitting: Internal Medicine

## 2016-07-25 ENCOUNTER — Other Ambulatory Visit: Payer: Self-pay | Admitting: Internal Medicine

## 2016-07-28 ENCOUNTER — Ambulatory Visit (INDEPENDENT_AMBULATORY_CARE_PROVIDER_SITE_OTHER): Payer: Medicare Other | Admitting: Internal Medicine

## 2016-07-28 ENCOUNTER — Encounter: Payer: Self-pay | Admitting: Internal Medicine

## 2016-07-28 DIAGNOSIS — R634 Abnormal weight loss: Secondary | ICD-10-CM

## 2016-07-28 DIAGNOSIS — F411 Generalized anxiety disorder: Secondary | ICD-10-CM

## 2016-07-28 MED ORDER — MIRTAZAPINE 7.5 MG PO TABS: 7.5000 mg | ORAL_TABLET | Freq: Every day | ORAL | 1 refills | Status: DC

## 2016-07-28 NOTE — Assessment & Plan Note (Signed)
Discussed starting remeron

## 2016-07-28 NOTE — Progress Notes (Signed)
Subjective:  Patient ID: Caitlin Mason, female    DOB: 02/09/1935  Age: 81 y.o. MRN: [id number]  CC: Diagnoses of Loss of weight and Anxiety state were pertinent to this visit.  HPI Caitlin Mason presents for follow up on fatigue and anorexia reported at last visit June 19.    Last Saturday she woke up too weak too walk. Slept most of the day , Sunday and Monday , but still took her xanax at night for management of anxiety. Has been  anorexic except for potatoes. She has lost 2 more lbs .  Repeat CT scan of abd pelvis in May showed no masses.    She has been unable to stop worrying about her elevated CA 125 despite repeatedly  normal CT scans.  There is no  laparoscopy planned after considerable discussion with Dr Bary Castilla .    Outpatient Medications Prior to Visit  Medication Sig Dispense Refill  . albuterol (PROAIR HFA) 108 (90 BASE) MCG/ACT inhaler Inhale 2 puffs into the lungs every 6 (six) hours as needed for wheezing or shortness of breath. 6.7 g 3  . ALPRAZolam (XANAX) 0.5 MG tablet TAKE ONE TABLET BY MOUTH AT BEDTIME AS NEEDED ANXIETY 30 tablet 5  . aspirin 81 MG EC tablet Take 81 mg by mouth daily.      . cholecalciferol (VITAMIN D) 1000 UNITS tablet Take 1,000 Units by mouth daily.    . fexofenadine (ALLEGRA) 180 MG tablet TAKE ONE TABLET BY MOUTH EVERY DAY 90 tablet 1  . omeprazole (PRILOSEC) 40 MG capsule TAKE ONE CAPSULE BY MOUTH EVERY DAY 30 capsule 5  . simvastatin (ZOCOR) 40 MG tablet TAKE ONE TABLET BY MOUTH DAILY 30 tablet 5  . enalapril (VASOTEC) 5 MG tablet TAKE 1 TABLET BY MOUTH EACH DAY 30 tablet 6   No facility-administered medications prior to visit.     Review of Systems;  Patient denies headache, fevers, malaise, unintentional weight loss, skin rash, eye pain, sinus congestion and sinus pain, sore throat, dysphagia,  hemoptysis , cough, dyspnea, wheezing, chest pain, palpitations, orthopnea, edema, abdominal pain, nausea, melena, diarrhea,  constipation, flank pain, dysuria, hematuria, urinary  Frequency, nocturia, numbness, tingling, seizures,  Focal weakness, Loss of consciousness,  Tremor, insomnia, depression, anxiety, and suicidal ideation.      Objective:  BP 134/72   Pulse 83   Temp 98.1 F (36.7 C) (Oral)   Ht 5' (1.524 m)   Wt 114 lb 4 oz (51.8 kg)   SpO2 97%   BMI 22.31 kg/m   BP Readings from Last 3 Encounters:  07/28/16 134/72  06/09/16 118/62  06/06/16 (!) 156/80    Wt Readings from Last 3 Encounters:  07/28/16 114 lb 4 oz (51.8 kg)  06/09/16 116 lb (52.6 kg)  06/06/16 118 lb 4 oz (53.6 kg)    General appearance: alert, cooperative and appears stated age Ears: normal TM's and external ear canals both ears Throat: lips, mucosa, and tongue normal; teeth and gums normal Neck: no adenopathy, no carotid bruit, supple, symmetrical, trachea midline and thyroid not enlarged, symmetric, no tenderness/mass/nodules Back: symmetric, no curvature. ROM normal. No CVA tenderness. Lungs: clear to auscultation bilaterally Heart: regular rate and rhythm, S1, S2 normal, no murmur, click, rub or gallop Abdomen: soft, non-tender; bowel sounds normal; no masses,  no organomegaly Pulses: 2+ and symmetric Skin: Skin color, texture, turgor normal. No rashes or lesions Lymph nodes: Cervical, supraclavicular, and axillary nodes normal.  No results found for: HGBA1C  Lab Results  Component Value Date   CREATININE 0.80 06/06/2016   CREATININE 0.80 04/20/2016   CREATININE 1.08 05/19/2015    Lab Results  Component Value Date   WBC 4.0 06/06/2016   HGB 12.8 06/06/2016   HCT 38.0 06/06/2016   PLT 193.0 06/06/2016   GLUCOSE 84 06/06/2016   CHOL 182 06/06/2016   TRIG 112.0 06/06/2016   HDL 71.70 06/06/2016   LDLDIRECT 131.2 10/16/2013   LDLCALC 88 06/06/2016   ALT 7 06/06/2016   AST 17 06/06/2016   NA 136 06/06/2016   K 4.5 06/06/2016   CL 101 06/06/2016   CREATININE 0.80 06/06/2016   BUN 12 06/06/2016   CO2  26 06/06/2016   TSH 0.70 06/06/2016    Ct Abdomen Pelvis W Contrast  Result Date: 04/20/2016 CLINICAL DATA:  Lower abdominal pain for 2 weeks. EXAM: CT ABDOMEN AND PELVIS WITH CONTRAST TECHNIQUE: Multidetector CT imaging of the abdomen and pelvis was performed using the standard protocol following bolus administration of intravenous contrast. CONTRAST:  181mL ISOVUE-300 IOPAMIDOL (ISOVUE-300) INJECTION 61% COMPARISON:  04/22/2015 FINDINGS: Lower chest:  No acute findings. Hepatobiliary: No masses or other significant abnormality. Pancreas: No mass, inflammatory changes, or other significant abnormality. Spleen: Within normal limits in size and appearance. Adrenals/Urinary Tract: Normal adrenal glands. There is bilateral renal cortical volume loss identified from both kidneys. Arising from the posterior cortex of the left kidney is small slightly exophytic hyperdense structure which measures 7 mm and 65 HU, image 15 of series 2. This is too small to reliably characterize but is unchanged from 04/22/2015. The urinary bladder is normal. Stomach/Bowel: The stomach is within normal limits. The small bowel loops have a normal course and caliber. No obstruction. Numerous distal colonic diverticula identified without acute inflammation. Vascular/Lymphatic: Calcified atherosclerotic disease involves the abdominal aorta. No aneurysm. No enlarged retroperitoneal or mesenteric adenopathy. No enlarged pelvic or inguinal lymph nodes. Reproductive: Previous hysterectomy.  No adnexal mass. Other: No free fluid or fluid collections. Musculoskeletal: Scoliosis deformity is identified. There is multi level degenerative disc disease identified within the lumbar spine. Anterolisthesis of L4 on L5 is noted. IMPRESSION: 1. No acute findings within the abdomen or pelvis. 2. There is an indeterminate hyperdense lesion arising from the posterior aspect of the left kidney measuring 7 mm. This is unchanged in size when compared with  04/22/2015. Recommend a followup examination in 12 months. The study of choice at followup would be a renal protocol MRI. 3. Aortic atherosclerosis. 4. Scoliosis and degenerative disc disease. Electronically Signed   By: Kerby Moors M.D.   On: 04/20/2016 11:33    Assessment & Plan:   Problem List Items Addressed This Visit    Anxiety state    Secondary to elevated CA 125 of unclear etiology .  Prior images and workup reviewed with patient again.  Advised to try mirtazipine for management of insomnia, anhedonia and fatigue.       Relevant Medications   mirtazapine (REMERON) 7.5 MG tablet   Loss of weight    Discussed starting remeron        Other Visit Diagnoses   None.    A total of 25 minutes of face to face time was spent with patient more than half of which was spent in counselling about the above mentioned conditions  and coordination of care  I am having Ms. Caitlin Mason start on mirtazapine. I am also having her maintain her aspirin, cholecalciferol, albuterol, ALPRAZolam, simvastatin, fexofenadine, and omeprazole.  Meds ordered  this encounter  Medications  . mirtazapine (REMERON) 7.5 MG tablet    Sig: Take 1 tablet (7.5 mg total) by mouth at bedtime.    Dispense:  30 tablet    Refill:  1    There are no discontinued medications.  Follow-up: Return in about 4 weeks (around 08/25/2016) for FOLLOW UP DEPRESSION WEIGHT LOSS.   Crecencio Mc, MD

## 2016-07-28 NOTE — Patient Instructions (Addendum)
I am starting you on a medication to improve your appetite.  It is called mirtazipine and you take it every night about an hour before bedtime  You may not need to use your alprazolam anymore to help you sleep , but you can if you do need it.  I will see you again in a month  GET SOME SUNSHINE!!!!

## 2016-07-29 ENCOUNTER — Other Ambulatory Visit: Payer: Self-pay | Admitting: Internal Medicine

## 2016-07-29 ENCOUNTER — Ambulatory Visit: Payer: Medicare Other | Admitting: Internal Medicine

## 2016-07-30 DIAGNOSIS — F411 Generalized anxiety disorder: Secondary | ICD-10-CM | POA: Insufficient documentation

## 2016-07-30 NOTE — Assessment & Plan Note (Signed)
Secondary to elevated CA 125 of unclear etiology .  Prior images and workup reviewed with patient again.  Advised to try mirtazipine for management of insomnia, anhedonia and fatigue.

## 2016-08-05 ENCOUNTER — Other Ambulatory Visit: Payer: Self-pay | Admitting: Internal Medicine

## 2016-08-08 ENCOUNTER — Telehealth: Payer: Self-pay | Admitting: Internal Medicine

## 2016-08-08 NOTE — Telephone Encounter (Signed)
Ms. Fatzinger called saying she feels she needs to discontinue taking Mirtazapine due to experiencing the following reactions: 1. Constipation 2. Lack of sleep 3. Lack of energy 4. Feels like she's in "[place]ala land"  She's wondering if she can wean off of it or if she can discontinue taking it all at once. She'd like a phone call regarding this AFTER 3PM.  Pt's ph# [phone] Thank you.

## 2016-08-08 NOTE — Telephone Encounter (Signed)
She stop the medication?

## 2016-08-08 NOTE — Telephone Encounter (Signed)
If she is still taking 7.5 mg dose she can just stop.  If she has increased dose to 15 mg she can reduce to 7.5 mg for 3 days, then stop

## 2016-08-08 NOTE — Telephone Encounter (Signed)
Patient has been informed.  Patient has been on the 7.5mg  and will be stopping medication today.

## 2016-08-15 ENCOUNTER — Other Ambulatory Visit: Payer: Self-pay | Admitting: Internal Medicine

## 2016-08-15 NOTE — Telephone Encounter (Signed)
Please advise on refill. According to Epic, pt's Zoloft was discontinued back in April. Zoloft has not been mentioned in past couple visits.

## 2016-08-17 NOTE — Telephone Encounter (Signed)
Do not refill.  

## 2016-08-30 ENCOUNTER — Telehealth: Payer: Self-pay | Admitting: Internal Medicine

## 2016-08-30 NOTE — Telephone Encounter (Signed)
Pt called about going back on the Zoloft pt still has some medication at home. Pt just wants to let Dr Derrel Nip know. Thank you!  Call pt @ [phone].

## 2016-08-30 NOTE — Telephone Encounter (Signed)
FYI patient restarted Zoloft.

## 2016-09-08 ENCOUNTER — Telehealth: Payer: Self-pay | Admitting: Internal Medicine

## 2016-09-08 NOTE — Telephone Encounter (Signed)
Yes

## 2016-09-08 NOTE — Telephone Encounter (Signed)
Patient has an appt on that day already she will hold out till then.

## 2016-09-08 NOTE — Telephone Encounter (Signed)
Could I use a 430 on next Tuesday? thanks

## 2016-09-08 NOTE — Telephone Encounter (Signed)
Pt called about feeling depressed loss of weight and her mind continuously runs. Pt has taken 0.5 mg of Xanax at night. Pt wanted to come in to see Dr Derrel Nip no appt avail. Pt only wants to see Dr Derrel Nip. Thank you!  Call pt @ [phone].

## 2016-09-13 ENCOUNTER — Encounter: Payer: Self-pay | Admitting: Internal Medicine

## 2016-09-13 ENCOUNTER — Ambulatory Visit (INDEPENDENT_AMBULATORY_CARE_PROVIDER_SITE_OTHER): Payer: Medicare Other | Admitting: Internal Medicine

## 2016-09-13 DIAGNOSIS — R634 Abnormal weight loss: Secondary | ICD-10-CM | POA: Diagnosis not present

## 2016-09-13 DIAGNOSIS — R971 Elevated cancer antigen 125 [CA 125]: Secondary | ICD-10-CM

## 2016-09-13 DIAGNOSIS — F411 Generalized anxiety disorder: Secondary | ICD-10-CM | POA: Diagnosis not present

## 2016-09-13 NOTE — Progress Notes (Signed)
Pre-visit discussion using our clinic review tool. No additional management support is needed unless otherwise documented below in the visit note.  

## 2016-09-13 NOTE — Patient Instructions (Signed)
You do NOT need any medicine for depression if you can control your feelings with prayer  If it gets worse, however, I will prescribe Zoloft again to help you  We will repeat your CAT scan in November to make sure nothing has "popped up"  You will need to have your kidney function and other labs checked before the CAT scan is done.  so make an appt for labs in early November.

## 2016-09-13 NOTE — Progress Notes (Signed)
Subjective:  Patient ID: Caitlin Mason, female    DOB: 07/11/1935  Age: 81 y.o. MRN: [id number]  CC: Diagnoses of Elevated CA-125, Loss of weight, and Anxiety state were pertinent to this visit.  HPI Caitlin Mason presents for follow up on anxiety with weight los.  She has decided to stop worrying about her elevated CA 125 since she has been adviseed not to pursue surgery.  Marland Kitchen   She was prescribed remeronat her last visit for management of insomnia and poor appetite but did not tolerate the 7.5 mg dose which made her agitated  And constipated,  Did not move bowels for 5 days,  Finally took lactulose. .  She continued to feel anxious for a while and resumed  taking zoloft  which she had leftover from an old prescription , but didn't feel right on it either so she stopped it.  Currently she feels better except for persistent insomnia   Insomnia is Chronic,  She takes xanax at 10;40 pm  To help calm her mind down from worrying , 20 minutes prior to bedtime .  Waking up now at 4:00 am and not able to re enter sleep  Despite eating cheese and taking a headache powder.  Wakes at 6 and doses for another hour.  Today is the first day she has felt like herself  In a long time, but has been having episodes of feeling overwhelmed and hopeless,  Using prayer to combat  The feeling of hopeless.  Can occur up to 12 times daily   Some days not at all. Like today    Appetite is back to normal.  in spite of no remeron or zoloft. She is  Eating 3 times daily  And having dessert too.  Marland Kitchen    Has to strain to fully empty bladder.  Denies pain and hesitation.. . Wonders if she has a blockage  Vision is getting worse due to macular degeneration .  Poor vision has limited her driving to city streets, no interstate . Feels it is causing her emotional distress .  Discussed repeating the abdominal CT in November     Outpatient Medications Prior to Visit  Medication Sig Dispense Refill  . albuterol  (PROAIR HFA) 108 (90 BASE) MCG/ACT inhaler Inhale 2 puffs into the lungs every 6 (six) hours as needed for wheezing or shortness of breath. 6.7 g 3  . ALPRAZolam (XANAX) 0.5 MG tablet TAKE ONE TABLET BY MOUTH AT BEDTIME AS NEEDED ANXIETY 30 tablet 5  . aspirin 81 MG EC tablet Take 81 mg by mouth daily.      . enalapril (VASOTEC) 5 MG tablet TAKE 1 TABLET BY MOUTH EACH DAY 30 tablet 3  . fexofenadine (ALLEGRA) 180 MG tablet TAKE ONE TABLET BY MOUTH EVERY DAY 90 tablet 1  . omeprazole (PRILOSEC) 40 MG capsule TAKE ONE CAPSULE BY MOUTH EVERY DAY 30 capsule 5  . simvastatin (ZOCOR) 40 MG tablet TAKE ONE TABLET BY MOUTH DAILY 30 tablet 5  . cholecalciferol (VITAMIN D) 1000 UNITS tablet Take 1,000 Units by mouth daily.    Marland Kitchen lactulose (CHRONULAC) 10 GM/15ML solution TAKE 30 ML (2 TABLESPOONSFUL) BY MOUTH EVERY 4 HOURS UNTIL CONSTIPATION IS RELIEVED 236 mL 3  . mirtazapine (REMERON) 7.5 MG tablet Take 1 tablet (7.5 mg total) by mouth at bedtime. (Patient not taking: Reported on 09/13/2016) 30 tablet 1   No facility-administered medications prior to visit.     Review of Systems;  Patient denies  headache, fevers, malaise, unintentional weight loss, skin rash, eye pain, sinus congestion and sinus pain, sore throat, dysphagia,  hemoptysis , cough, dyspnea, wheezing, chest pain, palpitations, orthopnea, edema, abdominal pain, nausea, melena, diarrhea, constipation, flank pain, dysuria, hematuria, urinary  Frequency, nocturia, numbness, tingling, seizures,  Focal weakness, Loss of consciousness,  Tremor, insomnia, depression, anxiety, and suicidal ideation.      Objective:  BP 138/68   Pulse 78   Temp 98 F (36.7 C) (Oral)   Resp 10   Ht 5' (1.524 m)   Wt 113 lb 4 oz (51.4 kg)   SpO2 95%   BMI 22.12 kg/m   BP Readings from Last 3 Encounters:  09/13/16 138/68  07/28/16 134/72  06/09/16 118/62    Wt Readings from Last 3 Encounters:  09/13/16 113 lb 4 oz (51.4 kg)  07/28/16 114 lb 4 oz (51.8  kg)  06/09/16 116 lb (52.6 kg)    General appearance: alert, cooperative and appears stated age Ears: normal TM's and external ear canals both ears Throat: lips, mucosa, and tongue normal; teeth and gums normal Neck: no adenopathy, no carotid bruit, supple, symmetrical, trachea midline and thyroid not enlarged, symmetric, no tenderness/mass/nodules Back: symmetric, no curvature. ROM normal. No CVA tenderness. Lungs: clear to auscultation bilaterally Heart: regular rate and rhythm, S1, S2 normal, no murmur, click, rub or gallop Abdomen: soft, non-tender; bowel sounds normal; no masses,  no organomegaly Pulses: 2+ and symmetric Skin: Skin color, texture, turgor normal. No rashes or lesions Lymph nodes: Cervical, supraclavicular, and axillary nodes normal.  No results found for: HGBA1C  Lab Results  Component Value Date   CREATININE 0.80 06/06/2016   CREATININE 0.80 04/20/2016   CREATININE 1.08 05/19/2015    Lab Results  Component Value Date   WBC 4.0 06/06/2016   HGB 12.8 06/06/2016   HCT 38.0 06/06/2016   PLT 193.0 06/06/2016   GLUCOSE 84 06/06/2016   CHOL 182 06/06/2016   TRIG 112.0 06/06/2016   HDL 71.70 06/06/2016   LDLDIRECT 131.2 10/16/2013   LDLCALC 88 06/06/2016   ALT 7 06/06/2016   AST 17 06/06/2016   NA 136 06/06/2016   K 4.5 06/06/2016   CL 101 06/06/2016   CREATININE 0.80 06/06/2016   BUN 12 06/06/2016   CO2 26 06/06/2016   TSH 0.70 06/06/2016    Ct Abdomen Pelvis W Contrast  Result Date: 04/20/2016 CLINICAL DATA:  Lower abdominal pain for 2 weeks. EXAM: CT ABDOMEN AND PELVIS WITH CONTRAST TECHNIQUE: Multidetector CT imaging of the abdomen and pelvis was performed using the standard protocol following bolus administration of intravenous contrast. CONTRAST:  187mL ISOVUE-300 IOPAMIDOL (ISOVUE-300) INJECTION 61% COMPARISON:  04/22/2015 FINDINGS: Lower chest:  No acute findings. Hepatobiliary: No masses or other significant abnormality. Pancreas: No mass,  inflammatory changes, or other significant abnormality. Spleen: Within normal limits in size and appearance. Adrenals/Urinary Tract: Normal adrenal glands. There is bilateral renal cortical volume loss identified from both kidneys. Arising from the posterior cortex of the left kidney is small slightly exophytic hyperdense structure which measures 7 mm and 65 HU, image 15 of series 2. This is too small to reliably characterize but is unchanged from 04/22/2015. The urinary bladder is normal. Stomach/Bowel: The stomach is within normal limits. The small bowel loops have a normal course and caliber. No obstruction. Numerous distal colonic diverticula identified without acute inflammation. Vascular/Lymphatic: Calcified atherosclerotic disease involves the abdominal aorta. No aneurysm. No enlarged retroperitoneal or mesenteric adenopathy. No enlarged pelvic or inguinal lymph nodes.  Reproductive: Previous hysterectomy.  No adnexal mass. Other: No free fluid or fluid collections. Musculoskeletal: Scoliosis deformity is identified. There is multi level degenerative disc disease identified within the lumbar spine. Anterolisthesis of L4 on L5 is noted. IMPRESSION: 1. No acute findings within the abdomen or pelvis. 2. There is an indeterminate hyperdense lesion arising from the posterior aspect of the left kidney measuring 7 mm. This is unchanged in size when compared with 04/22/2015. Recommend a followup examination in 12 months. The study of choice at followup would be a renal protocol MRI. 3. Aortic atherosclerosis. 4. Scoliosis and degenerative disc disease. Electronically Signed   By: Kerby Moors M.D.   On: 04/20/2016 11:33    Assessment & Plan:   Problem List Items Addressed This Visit    Elevated CA-125    Persistent for the past year accompanied by vague lower abdominal pain and 10 lb weight loss in one year. She is frustrated by the lack of alternative explanation and her quality of life was diminished by the  constant anxiety over a potentially undetected cancer. Will repeat the CT in November.  If there is a mass,  Will repeat the CA 125 and refer back to dr Bary Castilla. And Oncology.        Relevant Orders   CT ABDOMEN PELVIS W CONTRAST   Loss of weight    Anxiety induced.  CT chest abd pelvis has been negative for masses despite an elevated CA 125 .Her weight loss has slowed down and appetite has improved without remeron.       Relevant Orders   Comprehensive metabolic panel   CT ABDOMEN PELVIS W CONTRAST   Anxiety state    Currently managed with alprazolam for insomnia.  Will resume zoloft If symptoms become more persistent        Other Visit Diagnoses   None.    A total of 40 minutes was spent with patient more than half of which was spent in counseling patient on the above mentioned issues , reviewing and explaining recent labs and imaging studies done, and coordination of care.  I am having Caitlin Mason maintain her aspirin, cholecalciferol, albuterol, ALPRAZolam, simvastatin, fexofenadine, omeprazole, mirtazapine, enalapril, lactulose, and Multiple Vitamins-Minerals (MULTIVITAMIN ADULT PO).  Meds ordered this encounter  Medications  . Multiple Vitamins-Minerals (MULTIVITAMIN ADULT PO)    Sig: Take 1 tablet by mouth daily.    There are no discontinued medications.  Follow-up: Return in about 6 months (around 03/13/2017), or fasting labs early november .   Crecencio Mc, MD

## 2016-09-15 NOTE — Assessment & Plan Note (Addendum)
Anxiety induced.  CT chest abd pelvis has been negative for masses despite an elevated CA 125 .Her weight loss has slowed down and appetite has improved without remeron.

## 2016-09-15 NOTE — Assessment & Plan Note (Signed)
Currently managed with alprazolam for insomnia.  Will resume zoloft If symptoms become more persistent

## 2016-09-15 NOTE — Assessment & Plan Note (Signed)
Persistent for the past year accompanied by vague lower abdominal pain and 10 lb weight loss in one year. She is frustrated by the lack of alternative explanation and her quality of life was diminished by the constant anxiety over a potentially undetected cancer. Will repeat the CT in November.  If there is a mass,  Will repeat the CA 125 and refer back to dr Bary Castilla. And Oncology.

## 2016-09-19 ENCOUNTER — Telehealth: Payer: Self-pay | Admitting: Internal Medicine

## 2016-09-19 MED ORDER — SERTRALINE HCL 25 MG PO TABS: 50.0000 mg | ORAL_TABLET | Freq: Every day | ORAL | 1 refills | Status: DC

## 2016-09-19 NOTE — Telephone Encounter (Signed)
Patient stated she started the Zoloft 25 mg back last Wednesday,due to she couldn't stop crying, patient stated all she does nothing but cry all the time. Patient does not believe the Zoloft is helping and refuses psychiatry, patient does not want to take Xanax during the day.

## 2016-09-19 NOTE — Telephone Encounter (Signed)
Appointment set for follow up patient voiced understanding to increase dose of Zoloft to 50 mg daily. Called in new script for Zoloft.

## 2016-09-19 NOTE — Telephone Encounter (Signed)
{  lease let her know that zoloft  does not work overnight, but her  dose can be increased to 50 mg starting today and then again in 2 weeks if needed.  Give her a one month follow up

## 2016-09-19 NOTE — Telephone Encounter (Signed)
Pt called and stated that the medication that Dr. Derrel Nip gave her for depression has done nothing for her and is there anything else that you can give her? She is very depressed and doesn't know what to do. She states that she does not wish to take Xanax during the day because it makes her sleepy. Please advise. Thank you!  Call pt @ [phone] (h) [phone] (c)  [place], [place], [place]

## 2016-09-20 NOTE — Telephone Encounter (Signed)
PA for Zoloft completed on Cover my meds.

## 2016-09-23 NOTE — Telephone Encounter (Signed)
PA for Quantity limit approved from today to September 19, 2017.

## 2016-10-19 ENCOUNTER — Encounter: Payer: Self-pay | Admitting: Internal Medicine

## 2016-10-19 ENCOUNTER — Ambulatory Visit (INDEPENDENT_AMBULATORY_CARE_PROVIDER_SITE_OTHER): Payer: Medicare Other | Admitting: Family Medicine

## 2016-10-19 ENCOUNTER — Encounter: Payer: Self-pay | Admitting: Family Medicine

## 2016-10-19 VITALS — BP 156/88 | HR 83 | Temp 98.3°F | Resp 16 | Wt 113.1 lb

## 2016-10-19 DIAGNOSIS — R1030 Lower abdominal pain, unspecified: Secondary | ICD-10-CM | POA: Insufficient documentation

## 2016-10-19 DIAGNOSIS — E782 Mixed hyperlipidemia: Secondary | ICD-10-CM

## 2016-10-19 DIAGNOSIS — R634 Abnormal weight loss: Secondary | ICD-10-CM | POA: Diagnosis not present

## 2016-10-19 DIAGNOSIS — R1032 Left lower quadrant pain: Secondary | ICD-10-CM | POA: Diagnosis not present

## 2016-10-19 LAB — COMPREHENSIVE METABOLIC PANEL
ALT: 10 U/L (ref 0–35)
AST: 20 U/L (ref 0–37)
Albumin: 4.1 g/dL (ref 3.5–5.2)
Alkaline Phosphatase: 55 U/L (ref 39–117)
BILIRUBIN TOTAL: 0.3 mg/dL (ref 0.2–1.2)
BUN: 14 mg/dL (ref 6–23)
CO2: 29 meq/L (ref 19–32)
Calcium: 9.8 mg/dL (ref 8.4–10.5)
Chloride: 96 mEq/L (ref 96–112)
Creatinine, Ser: 0.88 mg/dL (ref 0.40–1.20)
GFR: 65.75 mL/min (ref >60.00)
GLUCOSE: 116 mg/dL — AB (ref 70–99)
Potassium: 4.9 mEq/L (ref 3.5–5.1)
SODIUM: 132 meq/L — AB (ref 135–145)
Total Protein: 6.8 g/dL (ref 6.0–8.3)

## 2016-10-19 LAB — LIPID PANEL
CHOL/HDL RATIO: 2
Cholesterol: 181 mg/dL (ref 0–200)
HDL: 81.1 mg/dL (ref >39.00)
LDL CALC: 74 mg/dL (ref 0–99)
NonHDL: 99.81
TRIGLYCERIDES: 131 mg/dL (ref 0.0–149.0)
VLDL: 26.2 mg/dL (ref 0.0–40.0)

## 2016-10-19 LAB — CBC
HCT: 38.5 % (ref 36.0–46.0)
Hemoglobin: 13 g/dL (ref 12.0–15.0)
MCHC: 33.8 g/dL (ref 30.0–36.0)
MCV: 85.7 fl (ref 78.0–100.0)
Platelets: 215 10*3/uL (ref 150.0–400.0)
RBC: 4.49 Mil/uL (ref 3.87–5.11)
RDW: 15.4 % (ref 11.5–15.5)
WBC: 6.2 10*3/uL (ref 4.0–10.5)

## 2016-10-19 LAB — POCT URINALYSIS DIPSTICK
Bilirubin, UA: NEGATIVE
Glucose, UA: NEGATIVE
Nitrite, UA: NEGATIVE
PH UA: 6
PROTEIN UA: NEGATIVE
RBC UA: NEGATIVE
UROBILINOGEN UA: NEGATIVE

## 2016-10-19 LAB — TSH: TSH: 2.04 u[IU]/mL (ref 0.35–4.50)

## 2016-10-19 NOTE — Assessment & Plan Note (Signed)
This appears to be an intermittent, chronic problem for the patient Current acute left lower quadrant pain. Unclear etiology/prognosis at this time. Appears well. UA with leukocytes. Obtaining culture.  PCP wanted to obtain a CT scan in November. Given acute occurrence of abdominal pain, will arrange as soon as possible. Labs today.

## 2016-10-19 NOTE — Progress Notes (Signed)
Subjective:  Patient ID: Caitlin Mason, female    DOB: [birth year]  Age: 81 y.o. MRN: [id number]  CC: Lower abdominal pain  HPI:  81 year old female with history of elevated CA 125 presents with complaints of acute lower abdominal pain.  Patient reports that she developed severe, acute lower abdominal pain this morning. Pain located in the left lower quadrant. No associated N/V. No change in stool. Had normal BM today. She does report that she's had a feeling of incomplete bladder emptying. She feels like she is having to strain at the end of urination. No dysuria. No urgency/frequency. No other associated symptoms. She states that at about 9:30 her pain improved spontaneously. She currently has no complaints of pain. No known exacerbating factors. No other complaints at this time.  Social Hx   Social History   Social History  . Marital status: Widowed    Spouse name: N/A  . Number of children: N/A  . Years of education: N/A   Social History Main Topics  . Smoking status: Former Smoker    Packs/day: 1.00    Years: 25.00    Types: Cigarettes    Quit date: 08/15/1985  . Smokeless tobacco: Never Used     Comment: quit 20 years ago -started at age 63  . Alcohol use No  . Drug use: No  . Sexual activity: Not Asked   Other Topics Concern  . None   Social History Narrative   Widowed, lives in [place], gets regular exercise by working in the yard.     Review of Systems  Constitutional: Negative for fever.  Gastrointestinal: Positive for abdominal pain. Negative for nausea and vomiting.  Genitourinary:       Straining with urination.   Objective:  BP (!) 156/88 (BP Location: Right Arm, Patient Position: Sitting, Cuff Size: Normal)   Pulse 83   Temp 98.3 F (36.8 C) (Oral)   Resp 16   Wt 113 lb 2 oz (51.3 kg)   SpO2 91%   BMI 22.09 kg/m   BP/Weight 10/19/2016 09/13/2016 03/27/7352  Systolic BP 299 242 683  Diastolic BP 88 68 72  Wt. (Lbs) 113.13 113.25 114.25    BMI 22.09 22.12 22.31   Physical Exam  Constitutional: She is oriented to person, place, and time. She appears well-developed. No distress.  Pulmonary/Chest: Effort normal.  Abdominal: Soft. She exhibits no distension.  Marked tenderness to palpation in the LLQ. No rebound or guarding. No mass.  Neurological: She is alert and oriented to person, place, and time.  Psychiatric: She has a normal mood and affect.  Vitals reviewed.  Lab Results  Component Value Date   WBC 4.0 06/06/2016   HGB 12.8 06/06/2016   HCT 38.0 06/06/2016   PLT 193.0 06/06/2016   GLUCOSE 84 06/06/2016   CHOL 182 06/06/2016   TRIG 112.0 06/06/2016   HDL 71.70 06/06/2016   LDLDIRECT 131.2 10/16/2013   LDLCALC 88 06/06/2016   ALT 7 06/06/2016   AST 17 06/06/2016   NA 136 06/06/2016   K 4.5 06/06/2016   CL 101 06/06/2016   CREATININE 0.80 06/06/2016   BUN 12 06/06/2016   CO2 26 06/06/2016   TSH 0.70 06/06/2016    Assessment & Plan:   Problem List Items Addressed This Visit    Loss of weight   Relevant Orders   TSH   LLQ pain - Primary    This appears to be an intermittent, chronic problem for the patient Current acute left  lower quadrant pain. Unclear etiology/prognosis at this time. Appears well. UA with leukocytes. Obtaining culture.  PCP wanted to obtain a CT scan in November. Given acute occurrence of abdominal pain, will arrange as soon as possible. Labs today.       Relevant Orders   POCT Urinalysis Dipstick (Completed)   Urine Culture   CBC   Comp Met (CMET)   Hyperlipidemia   Relevant Orders   Lipid panel    Other Visit Diagnoses   None.    Follow-up: PRN  Stephens

## 2016-10-19 NOTE — Patient Instructions (Signed)
Labs today.  We are arranging CT.  Follow-up closely with Dr. Derrel Nip  Take care  Dr. Lacinda Axon

## 2016-10-21 ENCOUNTER — Other Ambulatory Visit: Payer: Medicare Other

## 2016-10-21 LAB — URINE CULTURE: ORGANISM ID, BACTERIA: NO GROWTH

## 2016-10-25 ENCOUNTER — Telehealth: Payer: Self-pay | Admitting: *Deleted

## 2016-10-25 ENCOUNTER — Other Ambulatory Visit: Payer: Self-pay | Admitting: Internal Medicine

## 2016-10-25 NOTE — Telephone Encounter (Signed)
Patient requested her urine test result, pt stated that she did receive results. Pt contact  [phone]

## 2016-10-25 NOTE — Telephone Encounter (Signed)
Results were repeated to pt and she gave verbal understanding

## 2016-10-26 ENCOUNTER — Encounter: Payer: Self-pay | Admitting: Internal Medicine

## 2016-10-26 ENCOUNTER — Ambulatory Visit (INDEPENDENT_AMBULATORY_CARE_PROVIDER_SITE_OTHER): Payer: Medicare Other | Admitting: Internal Medicine

## 2016-10-26 ENCOUNTER — Ambulatory Visit: Payer: Medicare Other

## 2016-10-26 DIAGNOSIS — R634 Abnormal weight loss: Secondary | ICD-10-CM

## 2016-10-26 DIAGNOSIS — R1032 Left lower quadrant pain: Secondary | ICD-10-CM

## 2016-10-26 DIAGNOSIS — R971 Elevated cancer antigen 125 [CA 125]: Secondary | ICD-10-CM

## 2016-10-26 NOTE — Patient Instructions (Addendum)
You are  Losing weight because you are not getting enough protein   You can get protein from legumes (hard beans) ,  Or from any animal protein  I Recommend that you drink a low Carb high Protein premixed Shakes or  Drink a Boost Ensure once daily :   Premier Protein   Muscle Milk  Atkins Advantage  EAS AdvantEdge   All of these are available at [employer], [address],  [place]  And taste good

## 2016-10-26 NOTE — Progress Notes (Signed)
Pre-visit discussion using our clinic review tool. No additional management support is needed unless otherwise documented below in the visit note.  

## 2016-10-26 NOTE — Progress Notes (Signed)
Subjective:  Patient ID: Caitlin Mason, female    DOB: [birth year]  Age: 81 y.o. MRN: [id number]  CC: Diagnoses of Loss of weight, LLQ pain, and Elevated CA-125 were pertinent to this visit.  HPI Caitlin Mason presents for follow up on depression complicated by anxiety .  She was recently evaluated by colleague for recurrent episodes of stabbing LLQ pain.  She has a history of diverticulosis ,along with  vague abdominal pain and abdominal bloating in 2016  resulting in screening for ovarian ca. She was found to have a markedly elevated CA 125 with no evidence of neoplasm noted on recurrehent imaging. However, she has been unable to stop worrying about the prospect of Cancer and has lost considerable weight over the past year.  Repeat CT was ordered and is scheduled for tomorrow .  She has lost another lb., for a total of 17 lbs since June 2016   Diet discussed ,  Very low protein , eats mostly vegetables.  .  Having trouble emptying her bladder fully,  Feels she has to push very hard to finish her void.    zoloft is finally helping,  She reports having less frequent episodes of hopelessness that  overwhelm her ,  But do not feel like panic attacks. She is a [place] and dexcribes the feeling as a spiritual attack that leaves her helpless and hopeless"      Outpatient Medications Prior to Visit  Medication Sig Dispense Refill  . albuterol (PROAIR HFA) 108 (90 BASE) MCG/ACT inhaler Inhale 2 puffs into the lungs every 6 (six) hours as needed for wheezing or shortness of breath. 6.7 g 3  . ALPRAZolam (XANAX) 0.5 MG tablet TAKE ONE TABLET BY MOUTH AT BEDTIME AS NEEDED ANXIETY 30 tablet 5  . aspirin 81 MG EC tablet Take 81 mg by mouth daily.      . cholecalciferol (VITAMIN D) 1000 UNITS tablet Take 1,000 Units by mouth daily.    . enalapril (VASOTEC) 5 MG tablet TAKE 1 TABLET BY MOUTH EACH DAY 30 tablet 3  . fexofenadine (ALLEGRA) 180 MG tablet TAKE ONE TABLET BY MOUTH EVERY DAY 90 tablet  1  . lactulose (CHRONULAC) 10 GM/15ML solution TAKE 30 ML (2 TABLESPOONSFUL) BY MOUTH EVERY 4 HOURS UNTIL CONSTIPATION IS RELIEVED 236 mL 3  . Multiple Vitamins-Minerals (MULTIVITAMIN ADULT PO) Take 1 tablet by mouth daily.    Marland Kitchen omeprazole (PRILOSEC) 40 MG capsule TAKE ONE CAPSULE BY MOUTH EVERY DAY 30 capsule 5  . simvastatin (ZOCOR) 40 MG tablet TAKE ONE TABLET BY MOUTH DAILY 30 tablet 5  . sertraline (ZOLOFT) 25 MG tablet Take 2 tablets (50 mg total) by mouth daily. 60 tablet 1  . mirtazapine (REMERON) 7.5 MG tablet Take 1 tablet (7.5 mg total) by mouth at bedtime. (Patient not taking: Reported on 10/26/2016) 30 tablet 1   No facility-administered medications prior to visit.     Review of Systems;  Patient denies headache, fevers, malaise, unintentional weight loss, skin rash, eye pain, sinus congestion and sinus pain, sore throat, dysphagia,  hemoptysis , cough, dyspnea, wheezing, chest pain, palpitations, orthopnea, edema, abdominal pain, nausea, melena, diarrhea, constipation, flank pain, dysuria, hematuria, urinary  Frequency, nocturia, numbness, tingling, seizures,  Focal weakness, Loss of consciousness,  Tremor, insomnia, depression, anxiety, and suicidal ideation.      Objective:  BP 132/80   Pulse 74   Temp 98.3 F (36.8 C) (Oral)   Resp 12   Ht 5' (1.524 m)  Wt 111 lb 4 oz (50.5 kg)   SpO2 96%   BMI 21.73 kg/m   BP Readings from Last 3 Encounters:  10/26/16 132/80  10/19/16 (!) 156/88  09/13/16 138/68    Wt Readings from Last 3 Encounters:  10/26/16 111 lb 4 oz (50.5 kg)  10/19/16 113 lb 2 oz (51.3 kg)  09/13/16 113 lb 4 oz (51.4 kg)    General appearance: alert, cooperative and appears stated age Ears: normal TM's and external ear canals both ears Throat: lips, mucosa, and tongue normal; teeth and gums normal Neck: no adenopathy, no carotid bruit, supple, symmetrical, trachea midline and thyroid not enlarged, symmetric, no tenderness/mass/nodules Back:  symmetric, no curvature. ROM normal. No CVA tenderness. Lungs: clear to auscultation bilaterally Heart: regular rate and rhythm, S1, S2 normal, no murmur, click, rub or gallop Abdomen: Pain initially with LLQ palpation, but with repeated exam she has no rebound or guarding.  There are no masses.  bowel sounds normal; no masses,  no organomegaly Pulses: 2+ and symmetric Skin: Skin color, texture, turgor normal. No rashes or lesions Lymph nodes: Cervical, supraclavicular, and axillary nodes normal. Psych: affect sad/anxious, makes good eye contact. No fidgeting,  Smiles easily.  Denies suicidal thoughts   No results found for: HGBA1C  Lab Results  Component Value Date   CREATININE 0.88 10/19/2016   CREATININE 0.80 06/06/2016   CREATININE 0.80 04/20/2016    Lab Results  Component Value Date   WBC 6.2 10/19/2016   HGB 13.0 10/19/2016   HCT 38.5 10/19/2016   PLT 215.0 10/19/2016   GLUCOSE 116 (H) 10/19/2016   CHOL 181 10/19/2016   TRIG 131.0 10/19/2016   HDL 81.10 10/19/2016   LDLDIRECT 131.2 10/16/2013   LDLCALC 74 10/19/2016   ALT 10 10/19/2016   AST 20 10/19/2016   NA 132 (L) 10/19/2016   K 4.9 10/19/2016   CL 96 10/19/2016   CREATININE 0.88 10/19/2016   BUN 14 10/19/2016   CO2 29 10/19/2016   TSH 2.04 10/19/2016    Ct Abdomen Pelvis W Contrast  Result Date: 04/20/2016 CLINICAL DATA:  Lower abdominal pain for 2 weeks. EXAM: CT ABDOMEN AND PELVIS WITH CONTRAST TECHNIQUE: Multidetector CT imaging of the abdomen and pelvis was performed using the standard protocol following bolus administration of intravenous contrast. CONTRAST:  185mL ISOVUE-300 IOPAMIDOL (ISOVUE-300) INJECTION 61% COMPARISON:  04/22/2015 FINDINGS: Lower chest:  No acute findings. Hepatobiliary: No masses or other significant abnormality. Pancreas: No mass, inflammatory changes, or other significant abnormality. Spleen: Within normal limits in size and appearance. Adrenals/Urinary Tract: Normal adrenal glands.  There is bilateral renal cortical volume loss identified from both kidneys. Arising from the posterior cortex of the left kidney is small slightly exophytic hyperdense structure which measures 7 mm and 65 HU, image 15 of series 2. This is too small to reliably characterize but is unchanged from 04/22/2015. The urinary bladder is normal. Stomach/Bowel: The stomach is within normal limits. The small bowel loops have a normal course and caliber. No obstruction. Numerous distal colonic diverticula identified without acute inflammation. Vascular/Lymphatic: Calcified atherosclerotic disease involves the abdominal aorta. No aneurysm. No enlarged retroperitoneal or mesenteric adenopathy. No enlarged pelvic or inguinal lymph nodes. Reproductive: Previous hysterectomy.  No adnexal mass. Other: No free fluid or fluid collections. Musculoskeletal: Scoliosis deformity is identified. There is multi level degenerative disc disease identified within the lumbar spine. Anterolisthesis of L4 on L5 is noted. IMPRESSION: 1. No acute findings within the abdomen or pelvis. 2. There is an  indeterminate hyperdense lesion arising from the posterior aspect of the left kidney measuring 7 mm. This is unchanged in size when compared with 04/22/2015. Recommend a followup examination in 12 months. The study of choice at followup would be a renal protocol MRI. 3. Aortic atherosclerosis. 4. Scoliosis and degenerative disc disease. Electronically Signed   By: Kerby Moors M.D.   On: 04/20/2016 11:33    Assessment & Plan:   Problem List Items Addressed This Visit    LLQ pain    Etiology unclear.  CT with contrast showed no evidence of diverticular inflammation . And UA /culture was negative for infection and not suggestive of stones.  Her pain has been  epiosdic and recurrent for several years.  Consider IBS given history of  of constipation       Elevated CA-125    Persistent for the past year accompanied by  Recurrent episodes of vague  lower abdominal pain and 17 lb weight loss in one year. She is frustrated by the lack of alternative explanation and her quality of life has been significantly  diminished by the constant anxiety over a potentially undetected cancer. repeat the CT scan again showed no signs of ovarian or other organ mass.       Loss of weight    Secondary to anxiety and protein poor diet.  Recommendations made.  Continue zoloft.  Did not tolerate trial of remeron.          I am having Ms. Quentin Ore maintain her aspirin, cholecalciferol, albuterol, ALPRAZolam, simvastatin, fexofenadine, omeprazole, mirtazapine, enalapril, lactulose, and Multiple Vitamins-Minerals (MULTIVITAMIN ADULT PO).  No orders of the defined types were placed in this encounter.   There are no discontinued medications.  Follow-up: Return in about 3 months (around 01/26/2017).   Crecencio Mc, MD

## 2016-10-27 ENCOUNTER — Ambulatory Visit
Admission: RE | Admit: 2016-10-27 | Discharge: 2016-10-27 | Disposition: A | Discharge status: Home / Self Care | Payer: Medicare Other | Source: Ambulatory Visit | Attending: Internal Medicine | Admitting: Internal Medicine

## 2016-10-27 DIAGNOSIS — R971 Elevated cancer antigen 125 [CA 125]: Secondary | ICD-10-CM | POA: Diagnosis present

## 2016-10-27 DIAGNOSIS — M4185 Other forms of scoliosis, thoracolumbar region: Secondary | ICD-10-CM | POA: Diagnosis not present

## 2016-10-27 DIAGNOSIS — R634 Abnormal weight loss: Secondary | ICD-10-CM | POA: Diagnosis not present

## 2016-10-27 DIAGNOSIS — K5730 Diverticulosis of large intestine without perforation or abscess without bleeding: Secondary | ICD-10-CM | POA: Insufficient documentation

## 2016-10-27 DIAGNOSIS — K449 Diaphragmatic hernia without obstruction or gangrene: Secondary | ICD-10-CM | POA: Diagnosis not present

## 2016-10-27 DIAGNOSIS — Z90710 Acquired absence of both cervix and uterus: Secondary | ICD-10-CM | POA: Diagnosis not present

## 2016-10-27 DIAGNOSIS — I700 Atherosclerosis of aorta: Secondary | ICD-10-CM | POA: Diagnosis not present

## 2016-10-27 MED ORDER — IOPAMIDOL (ISOVUE-300) INJECTION 61%
100.0000 mL | Freq: Once | INTRAVENOUS | Status: AC | PRN
  Administered 2016-10-27: 80 mL via INTRAVENOUS

## 2016-10-29 NOTE — Assessment & Plan Note (Signed)
Secondary to anxiety and protein poor diet.  Recommendations made.  Continue zoloft.  Did not tolerate trial of remeron.

## 2016-10-29 NOTE — Assessment & Plan Note (Signed)
Etiology unclear.  CT with contrast showed no evidence of diverticular inflammation . And UA /culture was negative for infection and not suggestive of stones.  Her pain has been  epiosdic and recurrent for several years.  Consider IBS given history of  of constipation

## 2016-10-29 NOTE — Assessment & Plan Note (Signed)
Persistent for the past year accompanied by  Recurrent episodes of vague lower abdominal pain and 17 lb weight loss in one year. She is frustrated by the lack of alternative explanation and her quality of life has been significantly  diminished by the constant anxiety over a potentially undetected cancer. repeat the CT scan again showed no signs of ovarian or other organ mass.

## 2016-11-23 ENCOUNTER — Other Ambulatory Visit: Payer: Self-pay | Admitting: Internal Medicine

## 2016-11-24 NOTE — Telephone Encounter (Signed)
Rx faxed

## 2016-11-24 NOTE — Telephone Encounter (Signed)
Pt filled Xanax today; any refills authorized will be placed on file at pharmacy. Last OV 10/26/16.

## 2016-12-06 ENCOUNTER — Other Ambulatory Visit: Payer: Self-pay | Admitting: Internal Medicine

## 2016-12-06 DIAGNOSIS — Z1231 Encounter for screening mammogram for malignant neoplasm of breast: Secondary | ICD-10-CM

## 2017-01-10 ENCOUNTER — Ambulatory Visit
Admission: RE | Admit: 2017-01-10 | Discharge: 2017-01-10 | Disposition: A | Discharge status: Home / Self Care | Payer: Medicare Other | Source: Ambulatory Visit | Attending: Internal Medicine | Admitting: Internal Medicine

## 2017-01-10 ENCOUNTER — Other Ambulatory Visit: Payer: Self-pay | Admitting: Internal Medicine

## 2017-01-10 DIAGNOSIS — Z1231 Encounter for screening mammogram for malignant neoplasm of breast: Secondary | ICD-10-CM | POA: Diagnosis not present

## 2017-01-23 ENCOUNTER — Other Ambulatory Visit: Payer: Self-pay | Admitting: Internal Medicine

## 2017-02-02 ENCOUNTER — Ambulatory Visit: Payer: Medicare Other | Admitting: Internal Medicine

## 2017-02-15 ENCOUNTER — Other Ambulatory Visit: Payer: Self-pay | Admitting: Internal Medicine

## 2017-02-21 ENCOUNTER — Other Ambulatory Visit: Payer: Self-pay | Admitting: Internal Medicine

## 2017-03-01 ENCOUNTER — Encounter: Payer: Self-pay | Admitting: Internal Medicine

## 2017-03-01 ENCOUNTER — Ambulatory Visit (INDEPENDENT_AMBULATORY_CARE_PROVIDER_SITE_OTHER): Payer: Medicare Other | Admitting: Internal Medicine

## 2017-03-01 VITALS — BP 114/66 | HR 72 | Resp 14 | Ht 60.0 in | Wt 109.0 lb

## 2017-03-01 DIAGNOSIS — Z79899 Other long term (current) drug therapy: Secondary | ICD-10-CM | POA: Diagnosis not present

## 2017-03-01 DIAGNOSIS — R634 Abnormal weight loss: Secondary | ICD-10-CM

## 2017-03-01 DIAGNOSIS — E7800 Pure hypercholesterolemia, unspecified: Secondary | ICD-10-CM

## 2017-03-01 DIAGNOSIS — F411 Generalized anxiety disorder: Secondary | ICD-10-CM | POA: Diagnosis not present

## 2017-03-01 DIAGNOSIS — J439 Emphysema, unspecified: Secondary | ICD-10-CM | POA: Diagnosis not present

## 2017-03-01 DIAGNOSIS — R971 Elevated cancer antigen 125 [CA 125]: Secondary | ICD-10-CM | POA: Diagnosis not present

## 2017-03-01 DIAGNOSIS — G4700 Insomnia, unspecified: Secondary | ICD-10-CM

## 2017-03-01 DIAGNOSIS — I10 Essential (primary) hypertension: Secondary | ICD-10-CM

## 2017-03-01 NOTE — Progress Notes (Signed)
Pre visit review using our clinic review tool, if applicable. No additional management support is needed unless otherwise documented below in the visit note. 

## 2017-03-01 NOTE — Patient Instructions (Addendum)
You are doing great !!  You do not need to see the oncologist any more so you can cancel the appointment   I'll see you in 6 months

## 2017-03-01 NOTE — Progress Notes (Signed)
Subjective:  Patient ID: Caitlin Mason, female    DOB: 1935/05/07  Age: 82 y.o. MRN: [id number]  CC: The primary encounter diagnosis was Pure hypercholesterolemia. Diagnoses of Long-term use of high-risk medication, Pulmonary emphysema, unspecified emphysema type ([place]), Essential hypertension, benign, Insomnia, persistent, Anxiety state, Elevated CA-125, and Loss of weight were also pertinent to this visit.  HPI Caitlin Mason presents for follow up on anxiety/mood disorder , complicated by the history of an elevated CA 125 first seen in April 2016, with no masses noted on serial CTs .  She continues to have trouble maintaining a weight and has  lost 2 lbs since last visit despite having an improved appetite.  Diet reviewed.  She has modified her vegetarian diet to include beef and chicken several nights per week.  Also indulging in cheese peanut butter and macaroni.    CT of abdomen and pelvis done November 2017 reviewed with patient today. There were  No  adnexal masses, no  Lymphadenopathy, nothing   To explain the persistently elevated CA 125.  After  Two years of being frightened by the uncertainty, she states that she is no longer worried and finally  feels better .  She is sleeping well and reports that she no longer has episodes that sound like panic attacks   She has an appointment with the oncologist in April and wants my opinion on whether it is necessary to continue seeing her.  The visits aggravate her anxiety and fear.   Outpatient Medications Prior to Visit  Medication Sig Dispense Refill  . albuterol (PROAIR HFA) 108 (90 BASE) MCG/ACT inhaler Inhale 2 puffs into the lungs every 6 (six) hours as needed for wheezing or shortness of breath. 6.7 g 3  . ALPRAZolam (XANAX) 0.5 MG tablet TAKE ONE TABLET BY MOUTH AT BEDTIME AS NEEDED FOR ANXIETY 30 tablet 5  . aspirin 81 MG EC tablet Take 81 mg by mouth daily.      . enalapril (VASOTEC) 5 MG tablet TAKE ONE TABLET BY MOUTH  EVERY DAY 30 tablet 3  . fexofenadine (ALLEGRA) 180 MG tablet TAKE ONE TABLET BY MOUTH EVERY DAY 90 tablet 0  . lactulose (CHRONULAC) 10 GM/15ML solution TAKE 30 ML (2 TABLESPOONSFUL) BY MOUTH EVERY 4 HOURS UNTIL CONSTIPATION IS RELIEVED 236 mL 3  . Multiple Vitamins-Minerals (MULTIVITAMIN ADULT PO) Take 1 tablet by mouth daily.    Marland Kitchen omeprazole (PRILOSEC) 40 MG capsule TAKE ONE CAPSULE BY MOUTH EVERY DAY 30 capsule 2  . sertraline (ZOLOFT) 25 MG tablet TAKE TWO TABLETS BY MOUTH EVERY DAY 60 tablet 2  . simvastatin (ZOCOR) 40 MG tablet TAKE ONE TABLET BY MOUTH DAILY 30 tablet 2  . cholecalciferol (VITAMIN D) 1000 UNITS tablet Take 1,000 Units by mouth daily.     No facility-administered medications prior to visit.     Review of Systems;  Patient denies headache, fevers, malaise, unintentional weight loss, skin rash, eye pain, sinus congestion and sinus pain, sore throat, dysphagia,  hemoptysis , cough, dyspnea, wheezing, chest pain, palpitations, orthopnea, edema, abdominal pain, nausea, melena, diarrhea, constipation, flank pain, dysuria, hematuria, urinary  Frequency, nocturia, numbness, tingling, seizures,  Focal weakness, Loss of consciousness,  Tremor, insomnia, depression, anxiety, and suicidal ideation.      Objective:  BP 114/66 (BP Location: Left Arm, Patient Position: Sitting, Cuff Size: Normal)   Pulse 72   Resp 14   Ht 5' (1.524 m)   Wt 109 lb (49.4 kg)   SpO2 94%  BMI 21.29 kg/m   BP Readings from Last 3 Encounters:  03/01/17 114/66  10/26/16 132/80  10/19/16 (!) 156/88    Wt Readings from Last 3 Encounters:  03/01/17 109 lb (49.4 kg)  10/26/16 111 lb 4 oz (50.5 kg)  10/19/16 113 lb 2 oz (51.3 kg)    General appearance: alert, cooperative and appears stated age Ears: normal TM's and external ear canals both ears Throat: lips, mucosa, and tongue normal; teeth and gums normal Neck: no adenopathy, no carotid bruit, supple, symmetrical, trachea midline and thyroid  not enlarged, symmetric, no tenderness/mass/nodules Back: symmetric, no curvature. ROM normal. No CVA tenderness. Lungs: clear to auscultation bilaterally Heart: regular rate and rhythm, S1, S2 normal, no murmur, click, rub or gallop Abdomen: soft, non-tender; bowel sounds normal; no masses,  no organomegaly Pulses: 2+ and symmetric Skin: Skin color, texture, turgor normal. No rashes or lesions Lymph nodes: Cervical, supraclavicular, and axillary nodes normal.  No results found for: HGBA1C  Lab Results  Component Value Date   CREATININE 0.97 03/01/2017   CREATININE 0.88 10/19/2016   CREATININE 0.80 06/06/2016    Lab Results  Component Value Date   WBC 6.2 10/19/2016   HGB 13.0 10/19/2016   HCT 38.5 10/19/2016   PLT 215.0 10/19/2016   GLUCOSE 116 (H) 03/01/2017   CHOL 201 (H) 03/01/2017   TRIG 237.0 (H) 03/01/2017   HDL 66.80 03/01/2017   LDLDIRECT 110.0 03/01/2017   LDLCALC 74 10/19/2016   ALT 11 03/01/2017   AST 22 03/01/2017   NA 135 03/01/2017   K 4.5 03/01/2017   CL 98 03/01/2017   CREATININE 0.97 03/01/2017   BUN 18 03/01/2017   CO2 28 03/01/2017   TSH 2.04 10/19/2016    Mm Screening Breast Tomo Bilateral  Result Date: 01/11/2017 CLINICAL DATA:  Screening. EXAM: 2D DIGITAL SCREENING BILATERAL MAMMOGRAM WITH CAD AND ADJUNCT TOMO COMPARISON:  Previous exam(s). ACR Breast Density Category b: There are scattered areas of fibroglandular density. FINDINGS: There are no findings suspicious for malignancy. Images were processed with CAD. IMPRESSION: No mammographic evidence of malignancy. A result letter of this screening mammogram will be mailed directly to the patient. RECOMMENDATION: Screening mammogram in one year. (Code:SM-B-01Y) BI-RADS CATEGORY  1: Negative. Electronically Signed   By: Pamelia Hoit M.D.   On: 01/11/2017 17:25    Assessment & Plan:   Problem List Items Addressed This Visit    Anxiety state    Currently managed with 50 mg  zoloft .  No changes today  as she appears to be improving       COPD (chronic obstructive pulmonary disease) (HCC)    Severe,  No recent PFTs.  seondary to history of tobacco abuse, she currently  has no symptoms at rest and with ADLs.  No recent exacerbations    She was encouraged to increase daily activity  to include walking for 20 minutes.       Elevated CA-125    Noted in April 2016 when it was drawn during an evaluation with CT of pelvis for workup of recurrent  episodes of vague lower abdominal pain and 17 lb weight loss in one year. She has been frustrated by the lack of alternative explanation and her quality of life has been significantly  diminished by the constant anxiety over a potentially undetected cancer. Her Nov 2017  CT scan again showed no signs of ovarian or other organ mass.  She has decided to discontinue annual follow up with Oncology,to avoid  recurrent severe anxiety      Essential hypertension, benign    Well controlled on current regimen. Renal function stable, no changes today.  Lab Results  Component Value Date   CREATININE 0.97 03/01/2017   Lab Results  Component Value Date   NA 135 03/01/2017   K 4.5 03/01/2017   CL 98 03/01/2017   CO2 28 03/01/2017         Hyperlipidemia - Primary   Relevant Orders   Lipid panel (Completed)   Insomnia, persistent    managed with prn alprazolam   The risks and benefits of benzodiazepine use were reviewed  with patient today including excessive sedation leading to respiratory depression,  impaired thinking/driving, and addiction.  Patient was advised to avoid concurrent use with alcohol, to use medication only as needed and not to share with others  .          Loss of weight    Secondary to anxiety and protein poor diet. Still losing weight but appetite has improved. With use of  zoloft.  Did not tolerate trial of remeron.        Other Visit Diagnoses    Long-term use of high-risk medication       Relevant Orders   Comprehensive  metabolic panel (Completed)     A total of 25 minutes of face to face time was spent with patient more than half of which was spent in counselling about the above mentioned conditions  and coordination of care  I have discontinued Ms. Laneve's cholecalciferol. I am also having her maintain her aspirin, albuterol, lactulose, Multiple Vitamins-Minerals (MULTIVITAMIN ADULT PO), ALPRAZolam, fexofenadine, omeprazole, sertraline, simvastatin, and enalapril.  No orders of the defined types were placed in this encounter.   Medications Discontinued During This Encounter  Medication Reason  . cholecalciferol (VITAMIN D) 1000 UNITS tablet Patient has not taken in last 30 days    Follow-up: No Follow-up on file.   Crecencio Mc, MD

## 2017-03-02 LAB — LIPID PANEL
CHOL/HDL RATIO: 3
Cholesterol: 201 mg/dL — ABNORMAL HIGH (ref 0–200)
HDL: 66.8 mg/dL (ref >39.00)
NONHDL: 133.76
Triglycerides: 237 mg/dL — ABNORMAL HIGH (ref 0.0–149.0)
VLDL: 47.4 mg/dL — AB (ref 0.0–40.0)

## 2017-03-02 LAB — COMPREHENSIVE METABOLIC PANEL
ALBUMIN: 4.1 g/dL (ref 3.5–5.2)
ALT: 11 U/L (ref 0–35)
AST: 22 U/L (ref 0–37)
Alkaline Phosphatase: 54 U/L (ref 39–117)
BUN: 18 mg/dL (ref 6–23)
CALCIUM: 9.7 mg/dL (ref 8.4–10.5)
CHLORIDE: 98 meq/L (ref 96–112)
CO2: 28 meq/L (ref 19–32)
Creatinine, Ser: 0.97 mg/dL (ref 0.40–1.20)
GFR: 58.7 mL/min — AB (ref >60.00)
Glucose, Bld: 116 mg/dL — ABNORMAL HIGH (ref 70–99)
POTASSIUM: 4.5 meq/L (ref 3.5–5.1)
Sodium: 135 mEq/L (ref 135–145)
Total Bilirubin: 0.3 mg/dL (ref 0.2–1.2)
Total Protein: 6.7 g/dL (ref 6.0–8.3)

## 2017-03-02 LAB — LDL CHOLESTEROL, DIRECT: LDL DIRECT: 110 mg/dL

## 2017-03-04 NOTE — Assessment & Plan Note (Addendum)
Severe,  No recent PFTs.  seondary to history of tobacco abuse, she currently  has no symptoms at rest and with ADLs.  No recent exacerbations    She was encouraged to increase daily activity  to include walking for 20 minutes.

## 2017-03-04 NOTE — Assessment & Plan Note (Signed)
Noted in April 2016 when it was drawn during an evaluation with CT of pelvis for workup of recurrent  episodes of vague lower abdominal pain and 17 lb weight loss in one year. She has been frustrated by the lack of alternative explanation and her quality of life has been significantly  diminished by the constant anxiety over a potentially undetected cancer. Her Nov 2017  CT scan again showed no signs of ovarian or other organ mass.  She has decided to discontinue annual follow up with Oncology,to avoid recurrent severe anxiety

## 2017-03-04 NOTE — Assessment & Plan Note (Signed)
managed with prn alprazolam   The risks and benefits of benzodiazepine use were reviewed  with patient today including excessive sedation leading to respiratory depression,  impaired thinking/driving, and addiction.  Patient was advised to avoid concurrent use with alcohol, to use medication only as needed and not to share with others  .     

## 2017-03-04 NOTE — Assessment & Plan Note (Signed)
Currently managed with 50 mg  zoloft .  No changes today as she appears to be improving

## 2017-03-04 NOTE — Assessment & Plan Note (Signed)
Well controlled on current regimen. Renal function stable, no changes today.  Lab Results  Component Value Date   CREATININE 0.97 03/01/2017   Lab Results  Component Value Date   NA 135 03/01/2017   K 4.5 03/01/2017   CL 98 03/01/2017   CO2 28 03/01/2017

## 2017-03-04 NOTE — Assessment & Plan Note (Signed)
Secondary to anxiety and protein poor diet. Still losing weight but appetite has improved. With use of  zoloft.  Did not tolerate trial of remeron.

## 2017-03-29 ENCOUNTER — Other Ambulatory Visit: Payer: Medicare Other

## 2017-03-29 ENCOUNTER — Ambulatory Visit: Payer: Medicare Other

## 2017-04-04 ENCOUNTER — Ambulatory Visit: Payer: Medicare Other | Admitting: Internal Medicine

## 2017-04-25 ENCOUNTER — Other Ambulatory Visit: Payer: Self-pay | Admitting: Internal Medicine

## 2017-05-08 ENCOUNTER — Other Ambulatory Visit: Payer: Self-pay | Admitting: Internal Medicine

## 2017-05-16 ENCOUNTER — Other Ambulatory Visit: Payer: Self-pay | Admitting: Internal Medicine

## 2017-05-22 ENCOUNTER — Other Ambulatory Visit: Payer: Self-pay | Admitting: Internal Medicine

## 2017-06-19 ENCOUNTER — Telehealth: Payer: Self-pay | Admitting: Internal Medicine

## 2017-06-19 DIAGNOSIS — R7301 Impaired fasting glucose: Secondary | ICD-10-CM

## 2017-06-19 DIAGNOSIS — R634 Abnormal weight loss: Secondary | ICD-10-CM

## 2017-06-19 NOTE — Telephone Encounter (Signed)
Attempted to call to notify patient, unsuccessful due to no VM

## 2017-06-19 NOTE — Telephone Encounter (Signed)
Spoke with pt and informed her that she weighed 109lb in March 2018. Pt stated that she is now down to 105 but she eats like "crazy". Pt felt like she just needed to let you know.

## 2017-06-19 NOTE — Telephone Encounter (Signed)
Pt called wanting to know how much she weighed in March 2018? Please advise?  Call pt @ [phone]. Thank you!

## 2017-06-19 NOTE — Telephone Encounter (Signed)
If she feels fine (no abdominal pain ,  Nausea) we can check labs (thyroid and screen for diabetes) labs ordered

## 2017-06-20 ENCOUNTER — Other Ambulatory Visit (INDEPENDENT_AMBULATORY_CARE_PROVIDER_SITE_OTHER): Payer: Medicare Other

## 2017-06-20 DIAGNOSIS — R7301 Impaired fasting glucose: Secondary | ICD-10-CM | POA: Diagnosis not present

## 2017-06-20 DIAGNOSIS — R634 Abnormal weight loss: Secondary | ICD-10-CM

## 2017-06-20 LAB — COMPREHENSIVE METABOLIC PANEL
ALBUMIN: 4.1 g/dL (ref 3.5–5.2)
ALK PHOS: 48 U/L (ref 39–117)
ALT: 11 U/L (ref 0–35)
AST: 22 U/L (ref 0–37)
BUN: 11 mg/dL (ref 6–23)
CALCIUM: 9.9 mg/dL (ref 8.4–10.5)
CHLORIDE: 97 meq/L (ref 96–112)
CO2: 32 mEq/L (ref 19–32)
Creatinine, Ser: 0.96 mg/dL (ref 0.40–1.20)
GFR: 59.37 mL/min — AB (ref >60.00)
Glucose, Bld: 99 mg/dL (ref 70–99)
POTASSIUM: 5.2 meq/L — AB (ref 3.5–5.1)
Sodium: 132 mEq/L — ABNORMAL LOW (ref 135–145)
TOTAL PROTEIN: 6.7 g/dL (ref 6.0–8.3)
Total Bilirubin: 0.3 mg/dL (ref 0.2–1.2)

## 2017-06-20 LAB — HEMOGLOBIN A1C: HEMOGLOBIN A1C: 5.6 % (ref 4.6–6.5)

## 2017-06-20 LAB — TSH: TSH: 1.09 u[IU]/mL (ref 0.35–4.50)

## 2017-06-20 NOTE — Telephone Encounter (Signed)
Spoke with pt and she wanted to come on in today and have the lab work done. Pt is scheduled for today at 2:30pm.

## 2017-06-22 LAB — PREALBUMIN: Prealbumin: 32 mg/dL (ref 17–34)

## 2017-06-23 ENCOUNTER — Other Ambulatory Visit: Payer: Self-pay | Admitting: Internal Medicine

## 2017-06-23 DIAGNOSIS — E875 Hyperkalemia: Secondary | ICD-10-CM

## 2017-06-26 ENCOUNTER — Encounter: Payer: Self-pay | Admitting: Internal Medicine

## 2017-06-26 ENCOUNTER — Ambulatory Visit (INDEPENDENT_AMBULATORY_CARE_PROVIDER_SITE_OTHER): Payer: Medicare Other | Admitting: Internal Medicine

## 2017-06-26 VITALS — BP 126/68 | HR 78 | Temp 98.2°F | Resp 17 | Ht 60.0 in | Wt 105.6 lb

## 2017-06-26 DIAGNOSIS — R002 Palpitations: Secondary | ICD-10-CM

## 2017-06-26 DIAGNOSIS — I10 Essential (primary) hypertension: Secondary | ICD-10-CM | POA: Diagnosis not present

## 2017-06-26 DIAGNOSIS — F411 Generalized anxiety disorder: Secondary | ICD-10-CM | POA: Diagnosis not present

## 2017-06-26 DIAGNOSIS — R634 Abnormal weight loss: Secondary | ICD-10-CM

## 2017-06-26 DIAGNOSIS — R971 Elevated cancer antigen 125 [CA 125]: Secondary | ICD-10-CM | POA: Diagnosis not present

## 2017-06-26 MED ORDER — METOPROLOL SUCCINATE ER 25 MG PO TB24: 25.0000 mg | ORAL_TABLET | Freq: Every day | ORAL | 2 refills | Status: DC

## 2017-06-26 NOTE — Patient Instructions (Addendum)
I am changing your blood pressure medication from enalapril to metoprolol  To keep those "fluttering" (palpitations) to a minimum.  Take  The metoprolol daily at bedtime starting tonight and stop the enalapril.  Please have your blood pressure checked after about a week to make sure we still have it under control on the low dose of metoprolol

## 2017-06-26 NOTE — Progress Notes (Signed)
Subjective:  Patient ID: Caitlin Mason, female    DOB: 1935-06-10  Age: 82 y.o. MRN: [id number]  CC: The primary encounter diagnosis was Heart palpitations. Diagnoses of Loss of weight, Anxiety state, Palpitations, Essential hypertension, benign, and Elevated CA-125 were also pertinent to this visit.  HPI Caitlin Mason presents for evaluation of ongoing weight loss and new onset palpitations .  The episodes are intermittent and episodic .  Occur  About every other day and lasts for less than ten minutes.  One episode occurred while cooking dinner, the most recent one made her feel presyncopal but resolved before she even had a chance to sit down . others have happened while reclining .  Also having light headedness with sudden changes in position .   Not orthostatic today by my check  Lab Results  Component Value Date   WBC 5.4 06/26/2017   HGB 13.8 06/26/2017   HCT 41.0 06/26/2017   MCV 92.4 06/26/2017   PLT 224.0 06/26/2017     Outpatient Medications Prior to Visit  Medication Sig Dispense Refill  . ALPRAZolam (XANAX) 0.5 MG tablet TAKE ONE TABLET BY MOUTH AT BEDTIME AS NEEDED FOR ANXIETY 30 tablet 1  . aspirin 81 MG EC tablet Take 81 mg by mouth daily.      . fexofenadine (ALLEGRA) 180 MG tablet TAKE ONE TABLET BY MOUTH EVERY DAY 90 tablet 2  . lactulose (CHRONULAC) 10 GM/15ML solution TAKE 30 ML (2 TABLESPOONSFUL) BY MOUTH EVERY 4 HOURS UNTIL CONSTIPATION IS RELIEVED 236 mL 3  . Multiple Vitamins-Minerals (MULTIVITAMIN ADULT PO) Take 1 tablet by mouth daily.    Marland Kitchen omeprazole (PRILOSEC) 40 MG capsule TAKE ONE CAPSULE BY MOUTH EVERY DAY 30 capsule 3  . sertraline (ZOLOFT) 25 MG tablet TAKE TWO TABLETS BY MOUTH EVERY DAY 60 tablet 3  . simvastatin (ZOCOR) 40 MG tablet TAKE ONE TABLET BY MOUTH DAILY 30 tablet 2  . enalapril (VASOTEC) 5 MG tablet TAKE ONE TABLET BY MOUTH EVERY DAY 30 tablet 3  . albuterol (PROAIR HFA) 108 (90 BASE) MCG/ACT inhaler Inhale 2 puffs into the lungs  every 6 (six) hours as needed for wheezing or shortness of breath. (Patient not taking: Reported on 06/26/2017) 6.7 g 3   No facility-administered medications prior to visit.     Review of Systems;  Patient denies headache, fevers, malaise, unintentional weight loss, skin rash, eye pain, sinus congestion and sinus pain, sore throat, dysphagia,  hemoptysis , cough, dyspnea, wheezing, chest pain, palpitations, orthopnea, edema, abdominal pain, nausea, melena, diarrhea, constipation, flank pain, dysuria, hematuria, urinary  Frequency, nocturia, numbness, tingling, seizures,  Focal weakness, Loss of consciousness,  Tremor, insomnia, depression, anxiety, and suicidal ideation.      Objective:  BP 126/68 (BP Location: Left Arm, Patient Position: Sitting, Cuff Size: Normal)   Pulse 78   Temp 98.2 F (36.8 C) (Oral)   Resp 17   Ht 5' (1.524 m)   Wt 105 lb 9.6 oz (47.9 kg)   SpO2 94%   BMI 20.62 kg/m   BP Readings from Last 3 Encounters:  06/26/17 126/68  03/01/17 114/66  10/26/16 132/80    Wt Readings from Last 3 Encounters:  06/26/17 105 lb 9.6 oz (47.9 kg)  03/01/17 109 lb (49.4 kg)  10/26/16 111 lb 4 oz (50.5 kg)    General appearance: alert, cooperative and appears stated age Ears: normal TM's and external ear canals both ears Throat: lips, mucosa, and tongue normal; teeth and gums normal  Neck: no adenopathy, no carotid bruit, supple, symmetrical, trachea midline and thyroid not enlarged, symmetric, no tenderness/mass/nodules Back: symmetric, no curvature. ROM normal. No CVA tenderness. Lungs: clear to auscultation bilaterally Heart: regular rate and rhythm, S1, S2 normal, no murmur, click, rub or gallop Abdomen: soft, non-tender; bowel sounds normal; no masses,  no organomegaly Pulses: 2+ and symmetric Skin: Skin color, texture, turgor normal. No rashes or lesions Lymph nodes: Cervical, supraclavicular, and axillary nodes normal.  Lab Results  Component Value Date   HGBA1C  5.6 06/20/2017    Lab Results  Component Value Date   CREATININE 0.95 06/26/2017   CREATININE 0.96 06/20/2017   CREATININE 0.97 03/01/2017    Lab Results  Component Value Date   WBC 5.4 06/26/2017   HGB 13.8 06/26/2017   HCT 41.0 06/26/2017   PLT 224.0 06/26/2017   GLUCOSE 110 (H) 06/26/2017   CHOL 201 (H) 03/01/2017   TRIG 237.0 (H) 03/01/2017   HDL 66.80 03/01/2017   LDLDIRECT 110.0 03/01/2017   LDLCALC 74 10/19/2016   ALT 11 06/20/2017   AST 22 06/20/2017   NA 135 06/26/2017   K 4.4 06/26/2017   CL 97 06/26/2017   CREATININE 0.95 06/26/2017   BUN 15 06/26/2017   CO2 30 06/26/2017   TSH 1.09 06/20/2017   HGBA1C 5.6 06/20/2017    Mm Screening Breast Tomo Bilateral  Result Date: 01/11/2017 CLINICAL DATA:  Screening. EXAM: 2D DIGITAL SCREENING BILATERAL MAMMOGRAM WITH CAD AND ADJUNCT TOMO COMPARISON:  Previous exam(s). ACR Breast Density Category b: There are scattered areas of fibroglandular density. FINDINGS: There are no findings suspicious for malignancy. Images were processed with CAD. IMPRESSION: No mammographic evidence of malignancy. A result letter of this screening mammogram will be mailed directly to the patient. RECOMMENDATION: Screening mammogram in one year. (Code:SM-B-01Y) BI-RADS CATEGORY  1: Negative. Electronically Signed   By: Pamelia Hoit M.D.   On: 01/11/2017 17:25    Assessment & Plan:   Problem List Items Addressed This Visit    Essential hypertension, benign    changing enalapril to metoprolol for control of presumed PAC's.       Relevant Medications   metoprolol succinate (TOPROL-XL) 25 MG 24 hr tablet   Elevated CA-125    Noted in April 2016 when it was drawn during an evaluation with CT of pelvis for workup of recurrent  episodes of vague lower abdominal pain and 17 lb weight loss in one year. Her current anxiety state was triggered by the lack of alternative explanation and her quality of life was been negatively affected by her constant  anxiety over a potentially undetected cancer. Her Nov 2017  CT scan again showed no signs of ovarian or other organ mass.  She has decided to discontinue annual follow up with Oncology,to avoid recurrent severe anxiety      Loss of weight    Secondary to anxiety and protein poor diet. Still losing weight but less dramatically , and  appetite has improved. With use of  zoloft.  Did not tolerate trial of remeron.   Lab Results  Component Value Date   TSH 1.09 06/20/2017         Anxiety state    Currently managed with 50 mg  zoloft .  No changes today as she appears to be improving       Palpitations    C/v exam and EKG were normal. Today. She declines further workup unless absolutely necessary.  will stop lisinopril and start low dose  metoprolol.        Other Visit Diagnoses    Heart palpitations    -  Primary   Relevant Orders   EKG 12-Lead (Completed)   Basic metabolic panel (Completed)   Magnesium (Completed)   CBC with Differential/Platelet (Completed)    A total of 25 minutes of face to face time was spent with patient more than half of which was spent in counselling about the above mentioned conditions  and coordination of care   I have discontinued Ms. Burda's enalapril. I am also having her start on metoprolol succinate. Additionally, I am having her maintain her aspirin, albuterol, lactulose, Multiple Vitamins-Minerals (MULTIVITAMIN ADULT PO), simvastatin, omeprazole, sertraline, fexofenadine, and ALPRAZolam.  Meds ordered this encounter  Medications  . metoprolol succinate (TOPROL-XL) 25 MG 24 hr tablet    Sig: Take 1 tablet (25 mg total) by mouth daily.    Dispense:  30 tablet    Refill:  2    Medications Discontinued During This Encounter  Medication Reason  . enalapril (VASOTEC) 5 MG tablet     Follow-up: No Follow-up on file.   Crecencio Mc, MD

## 2017-06-27 LAB — BASIC METABOLIC PANEL
BUN: 15 mg/dL (ref 6–23)
CHLORIDE: 97 meq/L (ref 96–112)
CO2: 30 meq/L (ref 19–32)
Calcium: 9.9 mg/dL (ref 8.4–10.5)
Creatinine, Ser: 0.95 mg/dL (ref 0.40–1.20)
GFR: 60.08 mL/min (ref >60.00)
GLUCOSE: 110 mg/dL — AB (ref 70–99)
POTASSIUM: 4.4 meq/L (ref 3.5–5.1)
Sodium: 135 mEq/L (ref 135–145)

## 2017-06-27 LAB — CBC WITH DIFFERENTIAL/PLATELET
BASOS PCT: 1.8 % (ref 0.0–3.0)
Basophils Absolute: 0.1 10*3/uL (ref 0.0–0.1)
EOS PCT: 1.3 % (ref 0.0–5.0)
Eosinophils Absolute: 0.1 10*3/uL (ref 0.0–0.7)
HCT: 41 % (ref 36.0–46.0)
Hemoglobin: 13.8 g/dL (ref 12.0–15.0)
LYMPHS ABS: 1.1 10*3/uL (ref 0.7–4.0)
Lymphocytes Relative: 20.7 % (ref 12.0–46.0)
MCHC: 33.7 g/dL (ref 30.0–36.0)
MCV: 92.4 fl (ref 78.0–100.0)
MONOS PCT: 9.8 % (ref 3.0–12.0)
Monocytes Absolute: 0.5 10*3/uL (ref 0.1–1.0)
NEUTROS ABS: 3.6 10*3/uL (ref 1.4–7.7)
NEUTROS PCT: 66.4 % (ref 43.0–77.0)
PLATELETS: 224 10*3/uL (ref 150.0–400.0)
RBC: 4.43 Mil/uL (ref 3.87–5.11)
RDW: 13.6 % (ref 11.5–15.5)
WBC: 5.4 10*3/uL (ref 4.0–10.5)

## 2017-06-27 LAB — MAGNESIUM: Magnesium: 1.7 mg/dL (ref 1.5–2.5)

## 2017-06-28 ENCOUNTER — Other Ambulatory Visit: Payer: Medicare Other

## 2017-06-29 DIAGNOSIS — R002 Palpitations: Secondary | ICD-10-CM | POA: Insufficient documentation

## 2017-06-29 NOTE — Assessment & Plan Note (Signed)
Currently managed with 50 mg  zoloft .  No changes today as she appears to be improving

## 2017-06-29 NOTE — Assessment & Plan Note (Signed)
Noted in April 2016 when it was drawn during an evaluation with CT of pelvis for workup of recurrent  episodes of vague lower abdominal pain and 17 lb weight loss in one year. Her current anxiety state was triggered by the lack of alternative explanation and her quality of life was been negatively affected by her constant anxiety over a potentially undetected cancer. Her Nov 2017  CT scan again showed no signs of ovarian or other organ mass.  She has decided to discontinue annual follow up with Oncology,to avoid recurrent severe anxiety

## 2017-06-29 NOTE — Assessment & Plan Note (Signed)
C/v exam and EKG were normal. Today. She declines further workup unless absolutely necessary.  will stop lisinopril and start low dose metoprolol.

## 2017-06-29 NOTE — Assessment & Plan Note (Signed)
changing enalapril to metoprolol for control of presumed PAC's.

## 2017-06-29 NOTE — Assessment & Plan Note (Signed)
Secondary to anxiety and protein poor diet. Still losing weight but less dramatically , and  appetite has improved. With use of  zoloft.  Did not tolerate trial of remeron.   Lab Results  Component Value Date   TSH 1.09 06/20/2017

## 2017-07-03 ENCOUNTER — Telehealth: Payer: Self-pay | Admitting: Internal Medicine

## 2017-07-03 NOTE — Telephone Encounter (Signed)
Pt called in her blood pressure reading. Pt states that it was taken at 2:20 and it was 128/66 with a pulse of 60. Please advise, thank you!  Call pt @ [phone]

## 2017-07-03 NOTE — Telephone Encounter (Signed)
Perfect . Continue medication

## 2017-07-03 NOTE — Telephone Encounter (Signed)
Patient was on new medication and was advise by you to call

## 2017-07-14 ENCOUNTER — Telehealth: Payer: Self-pay | Admitting: Internal Medicine

## 2017-07-14 NOTE — Telephone Encounter (Signed)
Per PCP note patient was advised to stop enalapril had patient dispose of medication in cat litter.

## 2017-07-14 NOTE — Telephone Encounter (Signed)
Pt called about wanting to get clarity regarding her BP medication. Pt is aware she is taking metoprolol succinate (TOPROL-XL) 25 MG 24 hr tablet, but has a question about enalapril maleate she wants to know if she is supposed to stop taking it? Please advise?  Call pt @ [phone]. Thank you!

## 2017-07-15 ENCOUNTER — Other Ambulatory Visit: Payer: Self-pay | Admitting: Internal Medicine

## 2017-07-20 ENCOUNTER — Other Ambulatory Visit: Payer: Self-pay | Admitting: Internal Medicine

## 2017-07-26 ENCOUNTER — Telehealth: Payer: Self-pay | Admitting: Internal Medicine

## 2017-07-26 DIAGNOSIS — R1031 Right lower quadrant pain: Secondary | ICD-10-CM

## 2017-07-26 DIAGNOSIS — R1032 Left lower quadrant pain: Principal | ICD-10-CM

## 2017-07-26 NOTE — Telephone Encounter (Signed)
Spoke with the patient, Pain across the abdomen, consistent across both sides, she ate some toast and it feels a bit better, pain is a 8/9 out of ten, she is concerned since she has been eating a large consumption of tomatoes in the past weeks that she may have a seed stuck in there and it is flaring up her divericultitis.  She has a bulging disc in back and sometimes that pain is all from the back to front but this pain is different.  Bowels are normal, slightly loose, color normal.  Advised that we had no appts available.  She wanted to know if there is anything she can take for the pain/discomfort.

## 2017-07-26 NOTE — Telephone Encounter (Signed)
Pt called and is c/o possible diverticulitis. Pt states that she has pain across her abdomen and loose bowel movements. Pt states that she has been eating a lot of tomatoes, and wondering if this is causing it. Please advise, thank you!  Call pt  @ [phone]

## 2017-07-26 NOTE — Telephone Encounter (Signed)
Pt called back stating she is not hurting anymore. Pt states she prayed it away!!

## 2017-07-26 NOTE — Telephone Encounter (Signed)
I cannot recommend anything to take unless I know the diagnosis . I have ordered labs that will help me determine if this is diverticulitis along with an abdominal film. Lease ask her to get them today.

## 2017-08-18 ENCOUNTER — Other Ambulatory Visit: Payer: Self-pay | Admitting: Internal Medicine

## 2017-08-18 NOTE — Telephone Encounter (Signed)
ok'd rx for xanax #30 with no refills.  rx signed and placed on your desk.

## 2017-08-18 NOTE — Telephone Encounter (Signed)
Last OV 06/26/2017 Next OV 08/27/2017 Last refill 05/22/2017  Patient says she only has 1 pill left does not have enough to last her until Tullo comes back.

## 2017-09-06 ENCOUNTER — Encounter: Payer: Self-pay | Admitting: Internal Medicine

## 2017-09-06 ENCOUNTER — Ambulatory Visit (INDEPENDENT_AMBULATORY_CARE_PROVIDER_SITE_OTHER): Payer: Medicare Other | Admitting: Internal Medicine

## 2017-09-06 VITALS — BP 138/62 | HR 64 | Temp 97.8°F | Resp 15 | Ht 60.0 in | Wt 107.1 lb

## 2017-09-06 DIAGNOSIS — R7301 Impaired fasting glucose: Secondary | ICD-10-CM | POA: Diagnosis not present

## 2017-09-06 DIAGNOSIS — G4700 Insomnia, unspecified: Secondary | ICD-10-CM | POA: Diagnosis not present

## 2017-09-06 DIAGNOSIS — F411 Generalized anxiety disorder: Secondary | ICD-10-CM | POA: Diagnosis not present

## 2017-09-06 DIAGNOSIS — K4090 Unilateral inguinal hernia, without obstruction or gangrene, not specified as recurrent: Secondary | ICD-10-CM

## 2017-09-06 DIAGNOSIS — R002 Palpitations: Secondary | ICD-10-CM

## 2017-09-06 DIAGNOSIS — I10 Essential (primary) hypertension: Secondary | ICD-10-CM

## 2017-09-06 NOTE — Progress Notes (Signed)
Subjective:  Patient ID: Caitlin Mason, female    DOB: [birth year]  Age: 82 y.o. MRN: [id number]  CC: The primary encounter diagnosis was Impaired fasting blood sugar. Diagnoses of Palpitations, Generalized anxiety disorder, Insomnia, persistent, Inguinal hernia of left side without obstruction or gangrene, and Essential hypertension, benign were also pertinent to this visit.  HPI Caitlin Mason presents for follow up on palpitations, GAD   At her last visit Metoprolol started, enalapril stopped . Palpitations have resolved.   Positive depression screen . She suffers from GAD .   She is taking 50 mg zoloft in  the morning and using alprazolam  at night to "calm my brain down"  She is made anxious very easily, especially the night before doctor's appointments .  Getting bilateral eye injections  for macular degeneration.  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  No accidents  Tickets.  No falls. Lump in groin,  Has been present for a month,  Occasionally tender but not often.  becamse moticeable after strining to lift somethin gheavy.   Appetite is good she has gained a 1lb      Outpatient Medications Prior to Visit  Medication Sig Dispense Refill  . albuterol (PROAIR HFA) 108 (90 BASE) MCG/ACT inhaler Inhale 2 puffs into the lungs every 6 (six) hours as needed for wheezing or shortness of breath. 6.7 g 3  . ALPRAZolam (XANAX) 0.5 MG tablet TAKE ONE TABLET BY MOUTH AT BEDTIME AS NEEDED FOR ANXIETY 30 tablet 0  . aspirin 81 MG EC tablet Take 81 mg by mouth daily.      . fexofenadine (ALLEGRA) 180 MG tablet TAKE ONE TABLET BY MOUTH EVERY DAY 90 tablet 2  . lactulose (CHRONULAC) 10 GM/15ML solution TAKE 30 ML (2 TABLESPOONSFUL) BY MOUTH EVERY 4 HOURS UNTIL CONSTIPATION IS RELIEVED 236 mL 3  . metoprolol succinate (TOPROL-XL) 25 MG 24 hr tablet Take 1 tablet (25 mg total) by mouth daily. 30 tablet 2  . Multiple Vitamins-Minerals (MULTIVITAMIN ADULT PO) Take 1 tablet by mouth daily.     Marland Kitchen omeprazole (PRILOSEC) 40 MG capsule TAKE ONE CAPSULE BY MOUTH EVERY DAY 30 capsule 3  . sertraline (ZOLOFT) 25 MG tablet TAKE TWO TABLETS BY MOUTH EVERY DAY 60 tablet 3  . simvastatin (ZOCOR) 40 MG tablet TAKE ONE TABLET BY MOUTH EVERY DAY 30 tablet 1  . enalapril (VASOTEC) 5 MG tablet TAKE ONE TABLET BY MOUTH EVERY DAY (Patient not taking: Reported on 09/06/2017) 30 tablet 3   No facility-administered medications prior to visit.     Review of Systems;  Patient denies headache, fevers, malaise, unintentional weight loss, skin rash, eye pain, sinus congestion and sinus pain, sore throat, dysphagia,  hemoptysis , cough, dyspnea, wheezing, chest pain, palpitations, orthopnea, edema, abdominal pain, nausea, melena, diarrhea, constipation, flank pain, dysuria, hematuria, urinary  Frequency, nocturia, numbness, tingling, seizures,  Focal weakness, Loss of consciousness,  Tremor,, depression, and suicidal ideation.      Objective:  BP 138/62 (BP Location: Left Arm, Patient Position: Sitting, Cuff Size: Normal)   Pulse 64   Temp 97.8 F (36.6 C) (Oral)   Resp 15   Ht 5' (1.524 m)   Wt 107 lb 1.9 oz (48.6 kg)   SpO2 94%   BMI 20.92 kg/m   BP Readings from Last 3 Encounters:  09/06/17 138/62  06/26/17 126/68  03/01/17 114/66    Wt Readings from Last 3 Encounters:  09/06/17 107 lb 1.9 oz (48.6 kg)  06/26/17  105 lb 9.6 oz (47.9 kg)  03/01/17 109 lb (49.4 kg)    General appearance: alert, cooperative and appears stated age Ears: normal TM's and external ear canals both ears Throat: lips, mucosa, and tongue normal; teeth and gums normal Neck: no adenopathy, no carotid bruit, supple, symmetrical, trachea midline and thyroid not enlarged, symmetric, no tenderness/mass/nodules Back: symmetric, no curvature. ROM normal. No CVA tenderness. Lungs: clear to auscultation bilaterally Heart: regular rate and rhythm, S1, S2 normal, no murmur, click, rub or gallop Abdomen: soft, non-tender;  bowel sounds normal; no masses,  no organomegaly GU: small reducible inguinal hernia,  Left side. nontender to palpation Pulses: 2+ and symmetric Skin: Skin color, texture, turgor normal. No rashes or lesions Lymph nodes: Cervical, supraclavicular, and axillary nodes normal.  Lab Results  Component Value Date   HGBA1C 5.6 06/20/2017    Lab Results  Component Value Date   CREATININE 0.95 06/26/2017   CREATININE 0.96 06/20/2017   CREATININE 0.97 03/01/2017    Lab Results  Component Value Date   WBC 5.4 06/26/2017   HGB 13.8 06/26/2017   HCT 41.0 06/26/2017   PLT 224.0 06/26/2017   GLUCOSE 110 (H) 06/26/2017   CHOL 201 (H) 03/01/2017   TRIG 237.0 (H) 03/01/2017   HDL 66.80 03/01/2017   LDLDIRECT 110.0 03/01/2017   LDLCALC 74 10/19/2016   ALT 11 06/20/2017   AST 22 06/20/2017   NA 135 06/26/2017   K 4.4 06/26/2017   CL 97 06/26/2017   CREATININE 0.95 06/26/2017   BUN 15 06/26/2017   CO2 30 06/26/2017   TSH 1.09 06/20/2017   HGBA1C 5.6 06/20/2017    Mm Screening Breast Tomo Bilateral  Result Date: 01/11/2017 CLINICAL DATA:  Screening. EXAM: 2D DIGITAL SCREENING BILATERAL MAMMOGRAM WITH CAD AND ADJUNCT TOMO COMPARISON:  Previous exam(s). ACR Breast Density Category b: There are scattered areas of fibroglandular density. FINDINGS: There are no findings suspicious for malignancy. Images were processed with CAD. IMPRESSION: No mammographic evidence of malignancy. A result letter of this screening mammogram will be mailed directly to the patient. RECOMMENDATION: Screening mammogram in one year. (Code:SM-B-01Y) BI-RADS CATEGORY  1: Negative. Electronically Signed   By: Pamelia Hoit M.D.   On: 01/11/2017 17:25    Assessment & Plan:   Problem List Items Addressed This Visit    Essential hypertension, benign    she reports compliance with metoprolol but has an elevated reading today in office.  shehas been asked to check her readings at home and   submit readings for evaluation.  Renal function will be checked today  Lab Results  Component Value Date   CREATININE 0.95 06/26/2017   Lab Results  Component Value Date   NA 135 06/26/2017   K 4.4 06/26/2017   CL 97 06/26/2017   CO2 30 06/26/2017         Generalized anxiety disorder    Managed with stable doses of zoloft and qhs alprazolam for nocturnal overactive mind.      Inguinal hernia of left side without obstruction or gangrene    Reassurance provided not a cancer (patient has history of persistent elevation of CA 125 with recurrent CT's negative for masses). No action needed unless ir becomes nonreducible and /or painful       Insomnia, persistent    managed with prn alprazolam   The risks and benefits of benzodiazepine use were reviewed  with patient today including excessive sedation leading to respiratory depression,  impaired thinking/driving, and addiction.  Patient  was advised to avoid concurrent use with alcohol, to use medication only as needed and not to share with others  .          Palpitations    C/v exam and EKG were normal at last visit and she  declines further workup unless absolutely necessary.  Palpitations have improved since stopping  lisinopril and starting low dose metoprolol.       Relevant Orders   TSH    Other Visit Diagnoses    Impaired fasting blood sugar    -  Primary   Relevant Orders   Comprehensive metabolic panel   Hemoglobin A1c   Lipid panel     A total of 40 minutes was spent with patient more than half of which was spent in counseling patient on the above mentioned issues , reviewing and explaining recent labs and imaging studies done, and coordination of care.  I have discontinued Ms. Saffo's enalapril. I am also having her maintain her aspirin, albuterol, lactulose, Multiple Vitamins-Minerals (MULTIVITAMIN ADULT PO), omeprazole, sertraline, fexofenadine, metoprolol succinate, simvastatin, and ALPRAZolam.  No orders of the defined types were placed in  this encounter.   Medications Discontinued During This Encounter  Medication Reason  . enalapril (VASOTEC) 5 MG tablet Patient has not taken in last 30 days    Follow-up: Return in about 4 months (around 01/06/2018) for January, fasting labs prior to appt .   Crecencio Mc, MD

## 2017-09-06 NOTE — Patient Instructions (Signed)
Get your BP checked a few days out of the next month sand send me th readings  Your lump is an inguinal hernia.  Nothing needed to do except avoid straining .     Inguinal Hernia, Adult An inguinal hernia is when fat or the intestines push through the area where the leg meets the lower abdomen (groin) and create a rounded lump (bulge). This condition develops over time. There are three types of inguinal hernias. These types include:  Hernias that can be pushed back into the belly (are reducible).  Hernias that are not reducible (are incarcerated).  Hernias that are not reducible and lose their blood supply (are strangulated). This type of hernia requires emergency surgery.  What are the causes? This condition is caused by having a weak spot in the muscles or tissue. This weakness lets the hernia poke through. This condition can be triggered by:  Suddenly straining the muscles of the lower abdomen.  Lifting heavy objects.  Straining to have a bowel movement. Difficult bowel movements (constipation) can lead to this.  Coughing.  What increases the risk? This condition is more likely to develop in:  Men.  Pregnant women.  People who: ? Are overweight. ? Work in jobs that require long periods of standing or heavy lifting. ? Have had an inguinal hernia before. ? Smoke or have lung disease. These factors can lead to long-lasting (chronic) coughing.  What are the signs or symptoms? Symptoms can depend on the size of the hernia. Often, a small inguinal hernia has no symptoms. Symptoms of a larger hernia include:  A lump in the groin. This is easier to see when the person is standing. It might not be visible when he or she is lying down.  Pain or burning in the groin. This occurs especially when lifting, straining, or coughing.  A dull ache or a feeling of pressure in the groin.  A lump in the scrotum in men.  Symptoms of a strangulated inguinal hernia can include:  A bulge  in the groin that is very painful and tender to the touch.  A bulge that turns red or purple.  Fever, nausea, and vomiting.  The inability to have a bowel movement or to pass gas.  How is this diagnosed? This condition is diagnosed with a medical history and physical exam. Your health care provider may feel your groin area and ask you to cough. How is this treated? Treatment for this condition varies depending on the size of your hernia and whether you have symptoms. If you do not have symptoms, your health care provider may have you watch your hernia carefully and come in for follow-up visits. If your hernia is larger or if you have symptoms, your treatment will include surgery. Follow these instructions at home: Lifestyle  Drink enough fluid to keep your urine clear or pale yellow.  Eat a diet that includes a lot of fiber. Eat plenty of fruits, vegetables, and whole grains. Talk with your health care provider if you have questions.  Avoid lifting heavy objects.  Avoid standing for long periods of time.  Do not use tobacco products, including cigarettes, chewing tobacco, or e-cigarettes. If you need help quitting, ask your health care provider.  Maintain a healthy weight. General instructions  Do not try to force the hernia back in.  Watch your hernia for any changes in color or size. Let your health care provider know if any changes occur.  Take over-the-counter and prescription medicines only  as told by your health care provider.  Keep all follow-up visits as told by your health care provider. This is important. Contact a health care provider if:  You have a fever.  You have new symptoms.  Your symptoms get worse. Get help right away if:  You have pain in the groin that suddenly gets worse.  A bulge in the groin gets bigger suddenly and does not go down.  You are a man and you have a sudden pain in the scrotum, or the size of your scrotum suddenly changes.  A  bulge in the groin area becomes red or purple and is painful to the touch.  You have nausea or vomiting that does not go away.  You feel your heart beating a lot more quickly than normal.  You cannot have a bowel movement or pass gas. This information is not intended to replace advice given to you by your health care provider. Make sure you discuss any questions you have with your health care provider. Document Released: 04/23/2009 Document Revised: 05/12/2016 Document Reviewed: 10/15/2014 Elsevier Interactive Patient Education  2018 [employer].

## 2017-09-09 DIAGNOSIS — K4090 Unilateral inguinal hernia, without obstruction or gangrene, not specified as recurrent: Secondary | ICD-10-CM | POA: Insufficient documentation

## 2017-09-09 NOTE — Assessment & Plan Note (Signed)
she reports compliance with metoprolol but has an elevated reading today in office.  shehas been asked to check her readings at home and   submit readings for evaluation. Renal function will be checked today  Lab Results  Component Value Date   CREATININE 0.95 06/26/2017   Lab Results  Component Value Date   NA 135 06/26/2017   K 4.4 06/26/2017   CL 97 06/26/2017   CO2 30 06/26/2017

## 2017-09-09 NOTE — Assessment & Plan Note (Signed)
Reassurance provided not a cancer (patient has history of persistent elevation of CA 125 with recurrent CT's negative for masses). No action needed unless ir becomes nonreducible and /or painful

## 2017-09-09 NOTE — Assessment & Plan Note (Addendum)
Managed with stable doses of zoloft and qhs alprazolam for nocturnal overactive mind.

## 2017-09-09 NOTE — Assessment & Plan Note (Signed)
managed with prn alprazolam   The risks and benefits of benzodiazepine use were reviewed  with patient today including excessive sedation leading to respiratory depression,  impaired thinking/driving, and addiction.  Patient was advised to avoid concurrent use with alcohol, to use medication only as needed and not to share with others  .

## 2017-09-09 NOTE — Assessment & Plan Note (Signed)
C/v exam and EKG were normal at last visit and she  declines further workup unless absolutely necessary.  Palpitations have improved since stopping  lisinopril and starting low dose metoprolol.

## 2017-09-13 ENCOUNTER — Other Ambulatory Visit: Payer: Self-pay

## 2017-09-13 ENCOUNTER — Other Ambulatory Visit: Payer: Self-pay | Admitting: Internal Medicine

## 2017-09-13 MED ORDER — OMEPRAZOLE 40 MG PO CPDR: 40.0000 mg | DELAYED_RELEASE_CAPSULE | Freq: Every day | ORAL | 3 refills | Status: DC

## 2017-09-13 MED ORDER — SIMVASTATIN 40 MG PO TABS: 40.0000 mg | ORAL_TABLET | Freq: Every day | ORAL | 3 refills | Status: DC

## 2017-09-13 MED ORDER — METOPROLOL SUCCINATE ER 25 MG PO TB24: 25.0000 mg | ORAL_TABLET | Freq: Every day | ORAL | 2 refills | Status: DC

## 2017-09-17 ENCOUNTER — Other Ambulatory Visit: Payer: Self-pay | Admitting: Internal Medicine

## 2017-09-18 ENCOUNTER — Telehealth: Payer: Self-pay | Admitting: Internal Medicine

## 2017-09-18 NOTE — Telephone Encounter (Signed)
Pt called to follow up on Rx. Thank you!  Pharmacy is [facility], [place], [place]  Call pt @ [phone].

## 2017-09-18 NOTE — Telephone Encounter (Signed)
Pt called stating that pharmacy has not received the Rx for pt medication. Please advise?  Call pt @ [phone]. Thank you!

## 2017-09-18 NOTE — Telephone Encounter (Signed)
rx faxed to [facility]. Patient aware

## 2017-09-21 ENCOUNTER — Other Ambulatory Visit: Payer: Self-pay | Admitting: Internal Medicine

## 2017-10-10 ENCOUNTER — Telehealth: Payer: Self-pay | Admitting: Internal Medicine

## 2017-10-10 NOTE — Telephone Encounter (Signed)
Pt called to give her BP readings from today  134/71 pulse 62. Thank you!

## 2017-10-10 NOTE — Telephone Encounter (Signed)
Please advise 

## 2017-10-11 ENCOUNTER — Telehealth: Payer: Self-pay | Admitting: Internal Medicine

## 2017-10-11 NOTE — Telephone Encounter (Signed)
bp is fine.  Continue current meds

## 2017-10-11 NOTE — Telephone Encounter (Signed)
Both pt and pharmacy has been notified.

## 2017-10-11 NOTE — Telephone Encounter (Signed)
[employer] called with a approval on pt sertraline (ZOLOFT) 25 MG tablet. It was approved on 10/10/17 and is good for 1 year. A letter is being sent in the mail.

## 2017-10-12 NOTE — Telephone Encounter (Signed)
Spoke with pt and informed her that she is to continue current medication. Pt gave a verbal understanding.

## 2017-12-25 ENCOUNTER — Other Ambulatory Visit: Payer: Self-pay | Admitting: Internal Medicine

## 2017-12-25 DIAGNOSIS — Z1231 Encounter for screening mammogram for malignant neoplasm of breast: Secondary | ICD-10-CM

## 2017-12-26 ENCOUNTER — Telehealth: Payer: Self-pay | Admitting: Internal Medicine

## 2017-12-26 MED ORDER — ALBUTEROL SULFATE HFA 108 (90 BASE) MCG/ACT IN AERS: 2.0000 | INHALATION_SPRAY | Freq: Four times a day (QID) | RESPIRATORY_TRACT | 1 refills | Status: AC | PRN

## 2017-12-26 NOTE — Telephone Encounter (Signed)
See request for refill on Albutereol Inh. (Proair HFA); last OV 08/2017; last refill on the inhaler was 10/2015.

## 2017-12-26 NOTE — Telephone Encounter (Signed)
Please advise 

## 2017-12-26 NOTE — Telephone Encounter (Signed)
rx has been refilled.  

## 2017-12-26 NOTE — Telephone Encounter (Signed)
Copied from [place]. Topic: Quick Communication - Rx Refill/Question >> Dec 26, 2017 10:50 AM Synthia Innocent wrote: Has the patient contacted their pharmacy? Yes.     (Agent: If no, request that the patient contact the pharmacy for the refill.)   Preferred Pharmacy (with phone number or street name): [facility]   Agent: Please be advised that RX refills may take up to 3 business days. We ask that you follow-up with your pharmacy. Requesting refill on albuterol (PROAIR HFA) 108 (90 BASE) MCG/ACT inhaler

## 2018-01-04 ENCOUNTER — Other Ambulatory Visit (INDEPENDENT_AMBULATORY_CARE_PROVIDER_SITE_OTHER): Payer: Medicare Other

## 2018-01-04 DIAGNOSIS — R7301 Impaired fasting glucose: Secondary | ICD-10-CM

## 2018-01-04 DIAGNOSIS — R002 Palpitations: Secondary | ICD-10-CM | POA: Diagnosis not present

## 2018-01-04 LAB — COMPREHENSIVE METABOLIC PANEL
ALT: 15 U/L (ref 0–35)
AST: 23 U/L (ref 0–37)
Albumin: 4.2 g/dL (ref 3.5–5.2)
Alkaline Phosphatase: 64 U/L (ref 39–117)
BUN: 15 mg/dL (ref 6–23)
CALCIUM: 9.9 mg/dL (ref 8.4–10.5)
CO2: 34 mEq/L — ABNORMAL HIGH (ref 19–32)
Chloride: 97 mEq/L (ref 96–112)
Creatinine, Ser: 0.83 mg/dL (ref 0.40–1.20)
GFR: 70.12 mL/min (ref >60.00)
GLUCOSE: 97 mg/dL (ref 70–99)
POTASSIUM: 4.7 meq/L (ref 3.5–5.1)
Sodium: 137 mEq/L (ref 135–145)
TOTAL PROTEIN: 6.9 g/dL (ref 6.0–8.3)
Total Bilirubin: 0.5 mg/dL (ref 0.2–1.2)

## 2018-01-04 LAB — LIPID PANEL
CHOLESTEROL: 202 mg/dL — AB (ref 0–200)
HDL: 66.5 mg/dL (ref >39.00)
LDL Cholesterol: 110 mg/dL — ABNORMAL HIGH (ref 0–99)
NonHDL: 135.85
TRIGLYCERIDES: 127 mg/dL (ref 0.0–149.0)
Total CHOL/HDL Ratio: 3
VLDL: 25.4 mg/dL (ref 0.0–40.0)

## 2018-01-04 LAB — HEMOGLOBIN A1C: Hgb A1c MFr Bld: 5.8 % (ref 4.6–6.5)

## 2018-01-04 LAB — TSH: TSH: 1.61 u[IU]/mL (ref 0.35–4.50)

## 2018-01-08 ENCOUNTER — Other Ambulatory Visit: Payer: Medicare Other

## 2018-01-10 ENCOUNTER — Ambulatory Visit: Payer: Medicare Other | Admitting: Internal Medicine

## 2018-01-10 ENCOUNTER — Encounter: Payer: Self-pay | Admitting: Internal Medicine

## 2018-01-10 DIAGNOSIS — F411 Generalized anxiety disorder: Secondary | ICD-10-CM

## 2018-01-10 DIAGNOSIS — R002 Palpitations: Secondary | ICD-10-CM | POA: Diagnosis not present

## 2018-01-10 DIAGNOSIS — R7303 Prediabetes: Secondary | ICD-10-CM

## 2018-01-10 DIAGNOSIS — G3184 Mild cognitive impairment, so stated: Secondary | ICD-10-CM

## 2018-01-10 DIAGNOSIS — G4700 Insomnia, unspecified: Secondary | ICD-10-CM | POA: Diagnosis not present

## 2018-01-10 NOTE — Progress Notes (Signed)
Subjective:  Patient ID: Caitlin Mason, female    DOB: May 28, 1935  Age: 83 y.o. MRN: [id number]  CC: Diagnoses of Insomnia, persistent, Palpitations, Generalized anxiety disorder, Mild cognitive impairment with memory loss, and Prediabetes were pertinent to this visit.  HPI Caitlin Mason presents for 3 monh follow up on GAD,  IPG,  Hypertension and hyperlipidemia.  She is accompanied  by her granddaughter   Cc  1) Worried about her memory starting to go.  Forgetting names of people she recently met,  Details of conversations,  Not forgetting appointments or  taking her medications. However,  Has only been taking  25 mg zoloft because her pharmacy substituted the lower dose due to shortage of 50 mg tablets.  Bottle directions are accurate ("take 2 daily") but she has not been doing this  .    2) GAD/depression: " I wake up every morning so depressed I cannot go. " Happens twice daily  Often after long periods without eating.  Has a feeling that washes over her body like a cold shower.  Marland Kitchen  3)  Had a 15 minute episode of chest pain that occurred as she was preparing for bed  None since .  Remains physically active at home.   4) Palpitations, improved with toprol. Has excessive sleepiness with toprol xl taken during the day   Lab Results  Component Value Date   HGBA1C 5.8 01/04/2018     Mammogram next week    Outpatient Medications Prior to Visit  Medication Sig Dispense Refill  . albuterol (PROAIR HFA) 108 (90 Base) MCG/ACT inhaler Inhale 2 puffs into the lungs every 6 (six) hours as needed for wheezing or shortness of breath. 6.7 g 1  . aspirin 81 MG EC tablet Take 81 mg by mouth daily.      Marland Kitchen lactulose (CHRONULAC) 10 GM/15ML solution TAKE 30 ML (2 TABLESPOONSFUL) BY MOUTH EVERY 4 HOURS UNTIL CONSTIPATION IS RELIEVED 236 mL 3  . Multiple Vitamins-Minerals (MULTIVITAMIN ADULT PO) Take 1 tablet by mouth daily.    Marland Kitchen omeprazole (PRILOSEC) 40 MG capsule Take 1 capsule (40 mg  total) by mouth daily. 30 capsule 3  . simvastatin (ZOCOR) 40 MG tablet Take 1 tablet (40 mg total) by mouth daily. 30 tablet 3  . ALPRAZolam (XANAX) 0.5 MG tablet TAKE ONE TABLET BY MOUTH AT BEDTIME AS NEEDED FOR ANXIETY 30 tablet 3  . fexofenadine (ALLEGRA) 180 MG tablet TAKE ONE TABLET BY MOUTH EVERY DAY 90 tablet 2  . metoprolol succinate (TOPROL-XL) 25 MG 24 hr tablet TAKE ONE TABLET BY MOUTH EVERY DAY 30 tablet 3  . sertraline (ZOLOFT) 25 MG tablet TAKE TWO TABLETS BY MOUTH EVERY DAY 60 tablet 3  . omeprazole (PRILOSEC) 40 MG capsule TAKE ONE CAPSULE BY MOUTH EVERY DAY (Patient not taking: Reported on 01/10/2018) 30 capsule 3   No facility-administered medications prior to visit.     Review of Systems;  Patient denies headache, fevers, malaise, unintentional weight loss, skin rash, eye pain, sinus congestion and sinus pain, sore throat, dysphagia,  hemoptysis , cough, dyspnea, wheezing, chest pain, palpitations, orthopnea, edema, abdominal pain, nausea, melena, diarrhea, constipation, flank pain, dysuria, hematuria, urinary  Frequency, nocturia, numbness, tingling, seizures,  Focal weakness, Loss of consciousness,  Tremor, insomnia, depression, anxiety, and suicidal ideation.      Objective:  BP 136/64 (BP Location: Left Arm, Patient Position: Sitting, Cuff Size: Normal)   Pulse 71   Temp 98.1 F (36.7 C) (Oral)  Resp 15   Ht 5' (1.524 m)   Wt 110 lb 9.6 oz (50.2 kg)   SpO2 92%   BMI 21.60 kg/m   BP Readings from Last 3 Encounters:  01/10/18 136/64  09/06/17 138/62  06/26/17 126/68    Wt Readings from Last 3 Encounters:  01/10/18 110 lb 9.6 oz (50.2 kg)  09/06/17 107 lb 1.9 oz (48.6 kg)  06/26/17 105 lb 9.6 oz (47.9 kg)    General appearance: alert, cooperative and appears stated age Ears: normal TM's and external ear canals both ears Throat: lips, mucosa, and tongue normal; teeth and gums normal Neck: no adenopathy, no carotid bruit, supple, symmetrical, trachea  midline and thyroid not enlarged, symmetric, no tenderness/mass/nodules Back: symmetric, no curvature. ROM normal. No CVA tenderness. Lungs: clear to auscultation bilaterally Heart: regular rate and rhythm, S1, S2 normal, no murmur, click, rub or gallop Abdomen: soft, non-tender; bowel sounds normal; no masses,  no organomegaly Pulses: 2+ and symmetric Skin: Skin color, texture, turgor normal. No rashes or lesions Lymph nodes: Cervical, supraclavicular, and axillary nodes normal.  Lab Results  Component Value Date   HGBA1C 5.8 01/04/2018   HGBA1C 5.6 06/20/2017    Lab Results  Component Value Date   CREATININE 0.83 01/04/2018   CREATININE 0.95 06/26/2017   CREATININE 0.96 06/20/2017    Lab Results  Component Value Date   WBC 5.4 06/26/2017   HGB 13.8 06/26/2017   HCT 41.0 06/26/2017   PLT 224.0 06/26/2017   GLUCOSE 97 01/04/2018   CHOL 202 (H) 01/04/2018   TRIG 127.0 01/04/2018   HDL 66.50 01/04/2018   LDLDIRECT 110.0 03/01/2017   LDLCALC 110 (H) 01/04/2018   ALT 15 01/04/2018   AST 23 01/04/2018   NA 137 01/04/2018   K 4.7 01/04/2018   CL 97 01/04/2018   CREATININE 0.83 01/04/2018   BUN 15 01/04/2018   CO2 34 (H) 01/04/2018   TSH 1.61 01/04/2018   HGBA1C 5.8 01/04/2018    Mm Screening Breast Tomo Bilateral  Result Date: 01/11/2017 CLINICAL DATA:  Screening. EXAM: 2D DIGITAL SCREENING BILATERAL MAMMOGRAM WITH CAD AND ADJUNCT TOMO COMPARISON:  Previous exam(s). ACR Breast Density Category b: There are scattered areas of fibroglandular density. FINDINGS: There are no findings suspicious for malignancy. Images were processed with CAD. IMPRESSION: No mammographic evidence of malignancy. A result letter of this screening mammogram will be mailed directly to the patient. RECOMMENDATION: Screening mammogram in one year. (Code:[id number]) BI-RADS CATEGORY  1: Negative. Electronically Signed   By: Pamelia Hoit M.D.   On: 01/11/2017 17:25    Assessment & Plan:   Problem List  Items Addressed This Visit    Generalized anxiety disorder    Advised to increase sertraline dose back to 50 mg, the intended dose.       Insomnia, persistent    Advised t taek her Toprol at night given reported S/e of sleepiness      Mild cognitive impairment with memory loss    Her cognitive function has declined somewhat.  Granddaughter concerned about dementia.  She will return in one month for MMSE.  Checking thyroid b12 today  Lab Results  Component Value Date   TSH 1.61 01/04/2018   No results found for: VITAMINB12       Relevant Orders   Vitamin B12   RPR   Palpitations    Resolved with initiation of Torol       Prediabetes    Her  random glucose is not  elevated but her A1c suggests she is at risk for developing diabetes. Her twice daily episodes of weakness and cold sweat may be reactive hypoglycemia.  I recommend she avoid fasting and  follow a low glycemic index diet and particpate regularly in an aerobic  exercise activity.  We should check an A1c in 6 months.          A total of 25 minutes of face to face time was spent with patient more than half of which was spent in counselling about the above mentioned conditions  and coordination of care  I am having Jing L. Quentin Ore maintain her aspirin, lactulose, Multiple Vitamins-Minerals (MULTIVITAMIN ADULT PO), omeprazole, simvastatin, and albuterol.  No orders of the defined types were placed in this encounter.   Medications Discontinued During This Encounter  Medication Reason  . omeprazole (PRILOSEC) 40 MG capsule Duplicate    Follow-up: Return for MMSE   /cognitive testing .   Crecencio Mc, MD

## 2018-01-10 NOTE — Patient Instructions (Addendum)
Increase your sertraline to 50 mg daily  (two tablets)   You have "Prediabetes."    Your "episodes" may be due to  low blood sugars since they are happening when you are in a fasting state  Stop skipping  Meals   I Recommend trying one of these Low Carb high Protein premixed Shakes:   Premier Protein  Atkins Advantage Muscle Milk EAS AdvantEdge   All of these are available at [employer], [employer],  [employer], and [employer]  And taste good    Here are several low carb protein bars that make great substitutions for your "oack of Nabs":   Power Crunch  KIND 5 g sugar  Quest  Atkins   continue the metoprolol 25 daily at night to keep your palpitations down     Preventing Type 2 Diabetes Mellitus Type 2 diabetes (type 2 diabetes mellitus) is a long-term (chronic) disease that affects blood sugar (glucose) levels. Normally, a hormone called insulin allows glucose to enter cells in the body. The cells use glucose for energy. In type 2 diabetes, one or both of these problems may be present:  The body does not make enough insulin.  The body does not respond properly to insulin that it makes (insulin resistance).  Insulin resistance or lack of insulin causes excess glucose to build up in the blood instead of going into cells. As a result, high blood glucose (hyperglycemia) develops, which can cause many complications. Being overweight or obese and having an inactive (sedentary) lifestyle can increase your risk for diabetes. Type 2 diabetes can be delayed or prevented by making certain nutrition and lifestyle changes. What nutrition changes can be made?  Eat healthy meals and snacks regularly. Keep a healthy snack with you for when you get hungry between meals, such as fruit or a handful of nuts.  Eat lean meats and proteins that are low in saturated fats, such as chicken, fish, egg whites, and beans. Avoid processed meats.  Eat plenty of fruits and vegetables and plenty of grains that  have not been processed (whole grains). It is recommended that you eat: ? 1?2 cups of fruit every day. ? 2?3 cups of vegetables every day. ? 6?8 oz of whole grains every day, such as oats, whole wheat, bulgur, brown rice, quinoa, and millet.  Eat low-fat dairy products, such as milk, yogurt, and cheese.  Eat foods that contain healthy fats, such as nuts, avocado, olive oil, and canola oil.  Drink water throughout the day. Avoid drinks that contain added sugar, such as soda or sweet tea.  Follow instructions from your health care provider about specific eating or drinking restrictions.  Control how much food you eat at a time (portion size). ? Check food labels to find out the serving sizes of foods. ? Use a kitchen scale to weigh amounts of foods.  Saute or steam food instead of frying it. Cook with water or broth instead of oils or butter.  Limit your intake of: ? Salt (sodium). Have no more than 1 tsp (2,400 mg) of sodium a day. If you have heart disease or high blood pressure, have less than ? tsp (1,500 mg) of sodium a day. ? Saturated fat. This is fat that is solid at room temperature, such as butter or fat on meat. What lifestyle changes can be made?  Activity  Do moderate-intensity physical activity for at least 30 minutes on at least 5 days of the week, or as much as told by  your health care provider.  Ask your health care provider what activities are safe for you. A mix of physical activities may be best, such as walking, swimming, cycling, and strength training.  Try to add physical activity into your day. For example: ? Park in spots that are farther away than usual, so that you walk more. For example, park in a far corner of the parking lot when you go to the office or the grocery store. ? Take a walk during your lunch break. ? Use stairs instead of elevators or escalators. Weight Loss  Lose weight as directed. Your health care provider can determine how much  weight loss is best for you and can help you lose weight safely.  If you are overweight or obese, you may be instructed to lose at least 5?7 % of your body weight. Alcohol and Tobacco   Limit alcohol intake to no more than 1 drink a day for nonpregnant women and 2 drinks a day for men. One drink equals 12 oz of beer, 5 oz of wine, or 1 oz of hard liquor.  Do not use any tobacco products, such as cigarettes, chewing tobacco, and e-cigarettes. If you need help quitting, ask your health care provider. Work With [place] Provider  Have your blood glucose tested regularly, as told by your health care provider.  Discuss your risk factors and how you can reduce your risk for diabetes.  Get screening tests as told by your health care provider. You may have screening tests regularly, especially if you have certain risk factors for type 2 diabetes.  Make an appointment with a diet and nutrition specialist (registered dietitian). A registered dietitian can help you make a healthy eating plan and can help you understand portion sizes and food labels. Why are these changes important?  It is possible to prevent or delay type 2 diabetes and related health problems by making lifestyle and nutrition changes.  It can be difficult to recognize signs of type 2 diabetes. The best way to avoid possible damage to your body is to take actions to prevent the disease before you develop symptoms. What can happen if changes are not made?  Your blood glucose levels may keep increasing. Having high blood glucose for a long time is dangerous. Too much glucose in your blood can damage your blood vessels, heart, kidneys, nerves, and eyes.  You may develop prediabetes or type 2 diabetes. Type 2 diabetes can lead to many chronic health problems and complications, such as: ? Heart disease. ? Stroke. ? Blindness. ? Kidney disease. ? Depression. ? Poor circulation in the feet and legs, which could lead to  surgical removal (amputation) in severe cases. Where to find support:  Ask your health care provider to recommend a registered dietitian, diabetes educator, or weight loss program.  Look for local or online weight loss groups.  Join a gym, fitness club, or outdoor activity group, such as a walking club. Where to find more information: To learn more about diabetes and diabetes prevention, visit:  American Diabetes Association (ADA): www.diabetes.[employer] of Diabetes and Digestive and Kidney Diseases: FindSpin.nl  To learn more about healthy eating, visit:  The U.S. Department of Agriculture Scientist, research (physical sciences)), Choose My Plate: http://wiley-williams.com/  Office of Disease Prevention and Health Promotion (ODPHP), Dietary Guidelines: SurferLive.at  Summary  You can reduce your risk for type 2 diabetes by increasing your physical activity, eating healthy foods, and losing weight as directed.  Talk with your health  care provider about your risk for type 2 diabetes. Ask about any blood tests or screening tests that you need to have. This information is not intended to replace advice given to you by your health care provider. Make sure you discuss any questions you have with your health care provider. Document Released: 03/28/2016 Document Revised: 05/12/2016 Document Reviewed: 01/26/2016 Elsevier Interactive Patient Education  [employer].

## 2018-01-11 ENCOUNTER — Other Ambulatory Visit: Payer: Self-pay | Admitting: Internal Medicine

## 2018-01-11 ENCOUNTER — Telehealth: Payer: Self-pay | Admitting: Internal Medicine

## 2018-01-11 NOTE — Telephone Encounter (Signed)
Medication refill already sent to Dr. Derrel Nip. Waiting on response.

## 2018-01-11 NOTE — Telephone Encounter (Signed)
Xanax refill.  Last OV with Dr. Derrel Nip on 01/10/18.  Last filled on 09/18/17 with 30 tabs and 3 refills

## 2018-01-11 NOTE — Telephone Encounter (Signed)
Refilled: 09/23/2017 Last OV: 01/10/2018 Next OV: 04/12/2018

## 2018-01-11 NOTE — Telephone Encounter (Signed)
Copied from [place] [phone] [phone]. Topic: Quick Communication - Rx Refill/Question >> Jan 11, 2018 11:07 AM Patrice Paradise wrote: Medication: ALPRAZolam Duanne Moron) 0.5 MG tablet    Has the patient contacted their pharmacy? Yes.     (Agent: If no, request that the patient contact the pharmacy for the refill.)   Preferred Pharmacy (with phone number or street name):  [facility], [place] [place] [address] Phone: [phone] Fax: [phone]     Agent: Please be advised that RX refills may take up to 3 business days. We ask that you follow-up with your pharmacy.

## 2018-01-11 NOTE — Telephone Encounter (Signed)
Please advise 

## 2018-01-12 ENCOUNTER — Other Ambulatory Visit: Payer: Self-pay | Admitting: Internal Medicine

## 2018-01-12 NOTE — Telephone Encounter (Signed)
Printed, signed and faxed.  

## 2018-01-13 DIAGNOSIS — G3184 Mild cognitive impairment, so stated: Secondary | ICD-10-CM | POA: Insufficient documentation

## 2018-01-13 DIAGNOSIS — R7303 Prediabetes: Secondary | ICD-10-CM | POA: Insufficient documentation

## 2018-01-13 NOTE — Assessment & Plan Note (Signed)
Her cognitive function has declined somewhat.  Granddaughter concerned about dementia.  She will return in one month for MMSE.  Checking thyroid b12 today  Lab Results  Component Value Date   TSH 1.61 01/04/2018   No results found for: [id number]

## 2018-01-13 NOTE — Assessment & Plan Note (Signed)
Advised t taek her Toprol at night given reported S/e of sleepiness

## 2018-01-13 NOTE — Assessment & Plan Note (Addendum)
Her  random glucose is not  elevated but her A1c suggests she is at risk for developing diabetes. Her twice daily episodes of weakness and cold sweat may be reactive hypoglycemia.  I recommend she avoid fasting and  follow a low glycemic index diet and particpate regularly in an aerobic  exercise activity.  We should check an A1c in 6 months.

## 2018-01-13 NOTE — Assessment & Plan Note (Signed)
Advised to increase sertraline dose back to 50 mg, the intended dose.

## 2018-01-13 NOTE — Assessment & Plan Note (Addendum)
Resolved with initiation of Torol

## 2018-01-15 ENCOUNTER — Ambulatory Visit
Admission: RE | Admit: 2018-01-15 | Discharge: 2018-01-15 | Disposition: A | Discharge status: Home / Self Care | Payer: Medicare Other | Source: Ambulatory Visit | Attending: Internal Medicine | Admitting: Internal Medicine

## 2018-01-15 DIAGNOSIS — Z1231 Encounter for screening mammogram for malignant neoplasm of breast: Secondary | ICD-10-CM | POA: Insufficient documentation

## 2018-01-17 ENCOUNTER — Other Ambulatory Visit: Payer: Self-pay

## 2018-04-09 ENCOUNTER — Other Ambulatory Visit: Payer: Self-pay

## 2018-04-09 MED ORDER — SIMVASTATIN 40 MG PO TABS: 40.0000 mg | ORAL_TABLET | Freq: Every day | ORAL | 1 refills | Status: AC

## 2018-04-12 ENCOUNTER — Ambulatory Visit: Payer: Medicare Other | Admitting: Internal Medicine

## 2018-04-16 ENCOUNTER — Ambulatory Visit: Payer: Medicare Other | Admitting: Internal Medicine

## 2018-04-16 ENCOUNTER — Encounter: Payer: Self-pay | Admitting: Internal Medicine

## 2018-04-16 VITALS — BP 130/66 | HR 61 | Temp 98.2°F | Resp 15 | Ht 60.0 in | Wt 110.6 lb

## 2018-04-16 DIAGNOSIS — G3184 Mild cognitive impairment, so stated: Secondary | ICD-10-CM

## 2018-04-16 DIAGNOSIS — R300 Dysuria: Secondary | ICD-10-CM

## 2018-04-16 DIAGNOSIS — G4700 Insomnia, unspecified: Secondary | ICD-10-CM

## 2018-04-16 DIAGNOSIS — R7303 Prediabetes: Secondary | ICD-10-CM

## 2018-04-16 DIAGNOSIS — R3915 Urgency of urination: Secondary | ICD-10-CM | POA: Diagnosis not present

## 2018-04-16 NOTE — Progress Notes (Signed)
Subjective:  Patient ID: Caitlin Mason, female    DOB: 1935/10/09  Age: 83 y.o. MRN: [id number]  CC: The primary encounter diagnosis was Urinary urgency. Diagnoses of Mild cognitive impairment with memory loss, Prediabetes, Insomnia, persistent, and Dysuria were also pertinent to this visit.  HPI KYLIAH DEANDA presents for 3 month follow up on insomnia,   Prediabetes, anxiety and weight loss  Has gained 3 lbs since September and has kept the weight on. Reached a low of 105. Appetite for food is good.    Sleeping better with use of xanax and metoprolol for hypertension   Losing hearing ,  Does not wear an aid and does not want hearing test.  Problems hearing the TV.   ADLS:  She has Macular degeneration which has caused significant Right sided vision loss,  Ut the vision in her left eye is still good .  Still driving , denies any accidents,  But granddaughter wants her to stop driving due to observations of poor driving  which including making turns in front of other cars,  backing into the neighbor's yard. She is not  driving at night.  Had license renewed , passed the vision test at [employer] to pharmacy ,  Grocery store and gas station in [place], and church.   Back pain is tolerable with prn use of Goody's powders  Not more than one daily .  Better if she keeps moving . Bowels moving regularly with prune juice  Developed urgency OTW .  Has not fallen since October due to loss of balance   Family worried about her memory   Outpatient Medications Prior to Visit  Medication Sig Dispense Refill  . albuterol (PROAIR HFA) 108 (90 Base) MCG/ACT inhaler Inhale 2 puffs into the lungs every 6 (six) hours as needed for wheezing or shortness of breath. 6.7 g 1  . ALPRAZolam (XANAX) 0.5 MG tablet TAKE ONE TABLET BY MOUTH AT BEDTIME AS NEEDED FOR ANXIETY 30 tablet 5  . aspirin 81 MG EC tablet Take 81 mg by mouth daily.      . fexofenadine (ALLEGRA) 180 MG tablet TAKE ONE TABLET  BY MOUTH EVERY DAY 90 tablet 2  . lactulose (CHRONULAC) 10 GM/15ML solution TAKE 30 ML (2 TABLESPOONSFUL) BY MOUTH EVERY 4 HOURS UNTIL CONSTIPATION IS RELIEVED 236 mL 3  . metoprolol succinate (TOPROL-XL) 25 MG 24 hr tablet TAKE ONE TABLET BY MOUTH EVERY DAY 30 tablet 3  . Multiple Vitamins-Minerals (MULTIVITAMIN ADULT PO) Take 1 tablet by mouth daily.    Marland Kitchen omeprazole (PRILOSEC) 40 MG capsule TAKE ONE CAPSULE BY MOUTH EVERY DAY 30 capsule 3  . sertraline (ZOLOFT) 50 MG tablet TAKE ONE TABLET BY MOUTH EVERY DAY 30 tablet 2  . simvastatin (ZOCOR) 40 MG tablet Take 1 tablet (40 mg total) by mouth daily. 90 tablet 1  . omeprazole (PRILOSEC) 40 MG capsule Take 1 capsule (40 mg total) by mouth daily. (Patient not taking: Reported on 04/16/2018) 30 capsule 3  . simvastatin (ZOCOR) 40 MG tablet TAKE ONE TABLET BY MOUTH EVERY DAY (Patient not taking: Reported on 04/16/2018) 30 tablet 3   No facility-administered medications prior to visit.     Review of Systems;  Patient denies headache, fevers, malaise, unintentional weight loss, skin rash, eye pain, sinus congestion and sinus pain, sore throat, dysphagia,  hemoptysis , cough, dyspnea, wheezing, chest pain, palpitations, orthopnea, edema, abdominal pain, nausea, melena, diarrhea, constipation, flank pain, dysuria, hematuria, urinary  Frequency, nocturia,  numbness, tingling, seizures,  Focal weakness, Loss of consciousness,  Tremor, insomnia, depression, anxiety, and suicidal ideation.      Objective:  BP 130/66 (BP Location: Left Arm, Patient Position: Sitting, Cuff Size: Normal)   Pulse 61   Temp 98.2 F (36.8 C) (Oral)   Resp 15   Ht 5' (1.524 m)   Wt 110 lb 9.6 oz (50.2 kg)   SpO2 93%   BMI 21.60 kg/m   BP Readings from Last 3 Encounters:  04/16/18 130/66  01/10/18 136/64  09/06/17 138/62    Wt Readings from Last 3 Encounters:  04/16/18 110 lb 9.6 oz (50.2 kg)  01/10/18 110 lb 9.6 oz (50.2 kg)  09/06/17 107 lb 1.9 oz (48.6 kg)     General appearance: alert, cooperative and appears stated age Ears: normal TM's and external ear canals both ears Throat: lips, mucosa, and tongue normal; teeth and gums normal Neck: no adenopathy, no carotid bruit, supple, symmetrical, trachea midline and thyroid not enlarged, symmetric, no tenderness/mass/nodules Back: symmetric, no curvature. ROM normal. No CVA tenderness. Lungs: clear to auscultation bilaterally Heart: regular rate and rhythm, S1, S2 normal, no murmur, click, rub or gallop Abdomen: soft, non-tender; bowel sounds normal; no masses,  no organomegaly Pulses: 2+ and symmetric Skin: Skin color, texture, turgor normal. No rashes or lesions Lymph nodes: Cervical, supraclavicular, and axillary nodes normal. Neuro:  awake and interactive with normal mood and affect. Higher cortical functions are normal. Speech is clear without word-finding difficulty or dysarthria. Extraocular movements are intact. Visual fields of both eyes are grossly intact. Sensation to light touch is grossly intact bilaterally of upper and lower extremities. Motor examination shows 4+/5 symmetric hand grip and upper extremity and 5/5 lower extremity strength. There is no pronation or drift. Gait is non-ataxic MMSE:  26/80    Assessment & Plan:   Problem List Items Addressed This Visit    Prediabetes    Her  random glucose is not  elevated but her A1c suggests she is at risk for developing diabetes. Her twice daily episodes of weakness and cold sweat may be reactive hypoglycemia.  I recommend she avoid fasting and  follow a low glycemic index diet and particpate regularly in an aerobic  exercise activity.  We should check an A1c annually  Lab Results  Component Value Date   HGBA1C 5.8 01/04/2018         Mild cognitive impairment with memory loss    She had mild deficits on recall o today's MMSE.  She has normal  thyroid  Function   No results found for: VITAMINB12       Insomnia, persistent     Improved with change in administration of Toprol to nighttime       Dysuria    Mild, of one day's duration   Normal UA       Other Visit Diagnoses    Urinary urgency    -  Primary   Relevant Orders   Urinalysis, Routine w reflex microscopic (Completed)    A total of 25 minutes of face to face time was spent with patient more than half of which was spent in counselling about the above mentioned conditions  and coordination of care   I am having Leiani L. Quentin Ore maintain her aspirin, lactulose, Multiple Vitamins-Minerals (MULTIVITAMIN ADULT PO), albuterol, fexofenadine, sertraline, metoprolol succinate, omeprazole, ALPRAZolam, and simvastatin.  No orders of the defined types were placed in this encounter.   Medications Discontinued During This Encounter  Medication Reason  . omeprazole (PRILOSEC) 40 MG capsule Duplicate  . simvastatin (ZOCOR) 40 MG tablet Duplicate    Follow-up: Return in about 6 months (around 10/16/2018) for hypertension,  mild cognitive changes.   Crecencio Mc, MD

## 2018-04-16 NOTE — Patient Instructions (Addendum)
I encourage you to have a driving test soon at the [facility]   I am checking your urine today for infection   Fasting labs prior to next appointment in 6 months

## 2018-04-17 ENCOUNTER — Other Ambulatory Visit (INDEPENDENT_AMBULATORY_CARE_PROVIDER_SITE_OTHER): Payer: Medicare Other

## 2018-04-17 DIAGNOSIS — R3915 Urgency of urination: Secondary | ICD-10-CM | POA: Diagnosis not present

## 2018-04-17 DIAGNOSIS — R300 Dysuria: Secondary | ICD-10-CM | POA: Insufficient documentation

## 2018-04-17 LAB — URINALYSIS, ROUTINE W REFLEX MICROSCOPIC
Bilirubin Urine: NEGATIVE
HGB URINE DIPSTICK: NEGATIVE
Ketones, ur: NEGATIVE
Leukocytes, UA: NEGATIVE
Nitrite: NEGATIVE
SPECIFIC GRAVITY, URINE: 1.025 (ref 1.000–1.030)
TOTAL PROTEIN, URINE-UPE24: NEGATIVE
Urine Glucose: NEGATIVE
Urobilinogen, UA: 0.2 (ref 0.0–1.0)
pH: 5.5 (ref 5.0–8.0)

## 2018-04-17 NOTE — Assessment & Plan Note (Signed)
She had mild deficits on recall o today's MMSE.  She has normal  thyroid  Function   No results found for: VITAMINB12

## 2018-04-17 NOTE — Assessment & Plan Note (Signed)
Mild, of one day's duration   Normal UA

## 2018-04-17 NOTE — Assessment & Plan Note (Signed)
Her  random glucose is not  elevated but her A1c suggests she is at risk for developing diabetes. Her twice daily episodes of weakness and cold sweat may be reactive hypoglycemia.  I recommend she avoid fasting and  follow a low glycemic index diet and particpate regularly in an aerobic  exercise activity.  We should check an A1c annually  Lab Results  Component Value Date   HGBA1C 5.8 01/04/2018

## 2018-04-17 NOTE — Assessment & Plan Note (Signed)
Improved with change in administration of Toprol to nighttime

## 2018-04-22 ENCOUNTER — Inpatient Hospital Stay: Payer: Self-pay

## 2018-04-22 ENCOUNTER — Inpatient Hospital Stay: Payer: Medicare Other

## 2018-04-22 ENCOUNTER — Encounter: Payer: Self-pay | Admitting: Emergency Medicine

## 2018-04-22 ENCOUNTER — Other Ambulatory Visit: Payer: Self-pay

## 2018-04-22 ENCOUNTER — Emergency Department: Payer: Medicare Other

## 2018-04-22 ENCOUNTER — Inpatient Hospital Stay
Admission: EM | Admit: 2018-04-22 | Discharge: 2018-05-19 | DRG: 853 | Disposition: E | Payer: Medicare Other | Attending: Surgery | Admitting: Surgery

## 2018-04-22 ENCOUNTER — Emergency Department: Payer: Medicare Other | Admitting: Certified Registered"

## 2018-04-22 ENCOUNTER — Encounter: Admission: EM | Disposition: E | Payer: Self-pay | Source: Home / Self Care | Attending: Surgery

## 2018-04-22 DIAGNOSIS — E785 Hyperlipidemia, unspecified: Secondary | ICD-10-CM | POA: Diagnosis present

## 2018-04-22 DIAGNOSIS — I959 Hypotension, unspecified: Secondary | ICD-10-CM | POA: Diagnosis present

## 2018-04-22 DIAGNOSIS — K8591 Acute pancreatitis with uninfected necrosis, unspecified: Secondary | ICD-10-CM | POA: Diagnosis present

## 2018-04-22 DIAGNOSIS — Z87891 Personal history of nicotine dependence: Secondary | ICD-10-CM

## 2018-04-22 DIAGNOSIS — R188 Other ascites: Secondary | ICD-10-CM | POA: Diagnosis present

## 2018-04-22 DIAGNOSIS — I10 Essential (primary) hypertension: Secondary | ICD-10-CM | POA: Diagnosis present

## 2018-04-22 DIAGNOSIS — E872 Acidosis: Secondary | ICD-10-CM | POA: Diagnosis present

## 2018-04-22 DIAGNOSIS — F329 Major depressive disorder, single episode, unspecified: Secondary | ICD-10-CM | POA: Diagnosis present

## 2018-04-22 DIAGNOSIS — Z7982 Long term (current) use of aspirin: Secondary | ICD-10-CM | POA: Diagnosis not present

## 2018-04-22 DIAGNOSIS — Z66 Do not resuscitate: Secondary | ICD-10-CM | POA: Diagnosis not present

## 2018-04-22 DIAGNOSIS — K859 Acute pancreatitis without necrosis or infection, unspecified: Secondary | ICD-10-CM

## 2018-04-22 DIAGNOSIS — A419 Sepsis, unspecified organism: Secondary | ICD-10-CM | POA: Diagnosis present

## 2018-04-22 DIAGNOSIS — K8511 Biliary acute pancreatitis with uninfected necrosis: Secondary | ICD-10-CM | POA: Diagnosis present

## 2018-04-22 DIAGNOSIS — Z515 Encounter for palliative care: Secondary | ICD-10-CM | POA: Diagnosis not present

## 2018-04-22 DIAGNOSIS — K631 Perforation of intestine (nontraumatic): Secondary | ICD-10-CM | POA: Diagnosis present

## 2018-04-22 DIAGNOSIS — R739 Hyperglycemia, unspecified: Secondary | ICD-10-CM | POA: Diagnosis present

## 2018-04-22 DIAGNOSIS — R4182 Altered mental status, unspecified: Secondary | ICD-10-CM | POA: Diagnosis present

## 2018-04-22 DIAGNOSIS — R1084 Generalized abdominal pain: Secondary | ICD-10-CM

## 2018-04-22 DIAGNOSIS — Z803 Family history of malignant neoplasm of breast: Secondary | ICD-10-CM | POA: Diagnosis not present

## 2018-04-22 DIAGNOSIS — F419 Anxiety disorder, unspecified: Secondary | ICD-10-CM | POA: Diagnosis present

## 2018-04-22 DIAGNOSIS — R6521 Severe sepsis with septic shock: Secondary | ICD-10-CM | POA: Diagnosis not present

## 2018-04-22 DIAGNOSIS — K8010 Calculus of gallbladder with chronic cholecystitis without obstruction: Secondary | ICD-10-CM | POA: Diagnosis present

## 2018-04-22 DIAGNOSIS — J9811 Atelectasis: Secondary | ICD-10-CM

## 2018-04-22 DIAGNOSIS — Z801 Family history of malignant neoplasm of trachea, bronchus and lung: Secondary | ICD-10-CM

## 2018-04-22 DIAGNOSIS — J449 Chronic obstructive pulmonary disease, unspecified: Secondary | ICD-10-CM | POA: Diagnosis present

## 2018-04-22 HISTORY — PX: CHOLECYSTECTOMY: SHX55

## 2018-04-22 HISTORY — PX: LAPAROTOMY: SHX154

## 2018-04-22 LAB — LACTIC ACID, PLASMA
LACTIC ACID, VENOUS: 2.9 mmol/L — AB (ref 0.5–1.9)
Lactic Acid, Venous: 1.6 mmol/L (ref 0.5–1.9)
Lactic Acid, Venous: 4.9 mmol/L (ref 0.5–1.9)
Lactic Acid, Venous: 3.6 mmol/L (ref 0.5–1.9)
Lactic Acid, Venous: 5.6 mmol/L (ref 0.5–1.9)

## 2018-04-22 LAB — CBC WITH DIFFERENTIAL/PLATELET
BASOS ABS: 0 10*3/uL (ref 0–0.1)
Basophils Absolute: 0 10*3/uL (ref 0–0.1)
Basophils Relative: 0 %
Basophils Relative: 0 %
Eosinophils Absolute: 0 10*3/uL (ref 0–0.7)
Eosinophils Absolute: 0 10*3/uL (ref 0–0.7)
Eosinophils Relative: 0 %
Eosinophils Relative: 0 %
HEMATOCRIT: 39.7 % (ref 35.0–47.0)
HEMATOCRIT: 40 % (ref 35.0–47.0)
HEMOGLOBIN: 13.4 g/dL (ref 12.0–16.0)
Hemoglobin: 13.7 g/dL (ref 12.0–16.0)
LYMPHS ABS: 1 10*3/uL (ref 1.0–3.6)
LYMPHS ABS: 0.4 10*3/uL — AB (ref 1.0–3.6)
LYMPHS PCT: 14 %
LYMPHS PCT: 8 %
MCH: 31.1 pg (ref 26.0–34.0)
MCH: 31.4 pg (ref 26.0–34.0)
MCHC: 34.6 g/dL (ref 32.0–36.0)
MCHC: 33.6 g/dL (ref 32.0–36.0)
MCV: 90 fL (ref 80.0–100.0)
MCV: 93.4 fL (ref 80.0–100.0)
MONO ABS: 0.2 10*3/uL (ref 0.2–0.9)
MONOS PCT: 3 %
Monocytes Absolute: 0.1 10*3/uL — ABNORMAL LOW (ref 0.2–0.9)
Monocytes Relative: 2 %
NEUTROS ABS: 5.5 10*3/uL (ref 1.4–6.5)
NEUTROS ABS: 4.3 10*3/uL (ref 1.4–6.5)
NEUTROS PCT: 90 %
Neutrophils Relative %: 83 %
Platelets: 276 10*3/uL (ref 150–440)
Platelets: 225 10*3/uL (ref 150–440)
RBC: 4.41 MIL/uL (ref 3.80–5.20)
RBC: 4.29 MIL/uL (ref 3.80–5.20)
RDW: 13.7 % (ref 11.5–14.5)
RDW: 14.7 % — ABNORMAL HIGH (ref 11.5–14.5)
WBC: 6.7 10*3/uL (ref 3.6–11.0)
WBC: 4.8 10*3/uL (ref 3.6–11.0)

## 2018-04-22 LAB — COMPREHENSIVE METABOLIC PANEL
ALBUMIN: 4 g/dL (ref 3.5–5.0)
ALT: 20 U/L (ref 14–54)
ALT: 132 U/L — AB (ref 14–54)
ANION GAP: 9 (ref 5–15)
AST: 45 U/L — AB (ref 15–41)
AST: 423 U/L — ABNORMAL HIGH (ref 15–41)
Albumin: 2.2 g/dL — ABNORMAL LOW (ref 3.5–5.0)
Alkaline Phosphatase: 62 U/L (ref 38–126)
Alkaline Phosphatase: 52 U/L (ref 38–126)
Anion gap: 7 (ref 5–15)
BUN: 19 mg/dL (ref 6–20)
BUN: 18 mg/dL (ref 6–20)
CHLORIDE: 99 mmol/L — AB (ref 101–111)
CHLORIDE: 108 mmol/L (ref 101–111)
CO2: 28 mmol/L (ref 22–32)
CO2: 20 mmol/L — AB (ref 22–32)
CREATININE: 0.9 mg/dL (ref 0.44–1.00)
Calcium: 8.8 mg/dL — ABNORMAL LOW (ref 8.9–10.3)
Calcium: 6.7 mg/dL — ABNORMAL LOW (ref 8.9–10.3)
Creatinine, Ser: 0.93 mg/dL (ref 0.44–1.00)
GFR calc Af Amer: >60 mL/min (ref >60)
GFR, EST NON AFRICAN AMERICAN: 56 mL/min — AB (ref >60)
GFR, EST NON AFRICAN AMERICAN: 58 mL/min — AB (ref >60)
GLUCOSE: 167 mg/dL — AB (ref 65–99)
Glucose, Bld: 110 mg/dL — ABNORMAL HIGH (ref 65–99)
Potassium: 3.4 mmol/L — ABNORMAL LOW (ref 3.5–5.1)
Potassium: 3.9 mmol/L (ref 3.5–5.1)
SODIUM: 137 mmol/L (ref 135–145)
Sodium: 134 mmol/L — ABNORMAL LOW (ref 135–145)
Total Bilirubin: 0.6 mg/dL (ref 0.3–1.2)
Total Bilirubin: 0.8 mg/dL (ref 0.3–1.2)
Total Protein: 6.5 g/dL (ref 6.5–8.1)
Total Protein: 4 g/dL — ABNORMAL LOW (ref 6.5–8.1)

## 2018-04-22 LAB — BASIC METABOLIC PANEL
Anion gap: 8 (ref 5–15)
BUN: 23 mg/dL — AB (ref 6–20)
CO2: 14 mmol/L — ABNORMAL LOW (ref 22–32)
CREATININE: 1.54 mg/dL — AB (ref 0.44–1.00)
Calcium: 5.8 mg/dL — CL (ref 8.9–10.3)
Chloride: 109 mmol/L (ref 101–111)
GFR calc Af Amer: 35 mL/min — ABNORMAL LOW (ref >60)
GFR calc non Af Amer: 30 mL/min — ABNORMAL LOW (ref >60)
GLUCOSE: 177 mg/dL — AB (ref 65–99)
Potassium: 4.2 mmol/L (ref 3.5–5.1)
SODIUM: 131 mmol/L — AB (ref 135–145)

## 2018-04-22 LAB — MRSA PCR SCREENING: MRSA by PCR: NEGATIVE

## 2018-04-22 LAB — GLUCOSE, CAPILLARY
GLUCOSE-CAPILLARY: 106 mg/dL — AB (ref 65–99)
Glucose-Capillary: 103 mg/dL — ABNORMAL HIGH (ref 65–99)
Glucose-Capillary: 110 mg/dL — ABNORMAL HIGH (ref 65–99)
Glucose-Capillary: 115 mg/dL — ABNORMAL HIGH (ref 65–99)
Glucose-Capillary: 125 mg/dL — ABNORMAL HIGH (ref 65–99)

## 2018-04-22 LAB — TRIGLYCERIDES: Triglycerides: 63 mg/dL (ref <150)

## 2018-04-22 LAB — PROTIME-INR
INR: 1.26
PROTHROMBIN TIME: 15.7 s — AB (ref 11.4–15.2)

## 2018-04-22 LAB — DIFFERENTIAL
BAND NEUTROPHILS: 0 %
BASOS PCT: 0 %
BLASTS: 0 %
Basophils Absolute: 0 10*3/uL (ref 0–0.1)
EOS PCT: 0 %
Eosinophils Absolute: 0 10*3/uL (ref 0–0.7)
LYMPHS PCT: 35 %
Lymphs Abs: 0.5 10*3/uL — ABNORMAL LOW (ref 1.0–3.6)
MONO ABS: 0 10*3/uL — AB (ref 0.2–0.9)
Metamyelocytes Relative: 0 %
Monocytes Relative: 3 %
Myelocytes: 0 %
NRBC: 0 /100{WBCs}
Neutro Abs: 0.9 10*3/uL — ABNORMAL LOW (ref 1.4–6.5)
Neutrophils Relative %: 62 %
OTHER: 0 %
PROMYELOCYTES RELATIVE: 0 %

## 2018-04-22 LAB — CBC
HCT: 40.7 % (ref 35.0–47.0)
Hemoglobin: 13.8 g/dL (ref 12.0–16.0)
MCH: 31.2 pg (ref 26.0–34.0)
MCHC: 33.9 g/dL (ref 32.0–36.0)
MCV: 92.1 fL (ref 80.0–100.0)
Platelets: 287 10*3/uL (ref 150–440)
RBC: 4.42 MIL/uL (ref 3.80–5.20)
RDW: 14.3 % (ref 11.5–14.5)
WBC: 1.4 10*3/uL — AB (ref 3.6–11.0)

## 2018-04-22 LAB — PHOSPHORUS
PHOSPHORUS: 3.8 mg/dL (ref 2.5–4.6)
Phosphorus: 4.8 mg/dL — ABNORMAL HIGH (ref 2.5–4.6)

## 2018-04-22 LAB — APTT: APTT: 28 s (ref 24–36)

## 2018-04-22 LAB — MAGNESIUM
MAGNESIUM: 1.4 mg/dL — AB (ref 1.7–2.4)
MAGNESIUM: 1.4 mg/dL — AB (ref 1.7–2.4)

## 2018-04-22 LAB — LIPASE, BLOOD
LIPASE: 942 U/L — AB (ref 11–51)
LIPASE: 522 U/L — AB (ref 11–51)

## 2018-04-22 SURGERY — LAPAROTOMY, EXPLORATORY
Anesthesia: General | Site: Abdomen | Wound class: Clean Contaminated

## 2018-04-22 MED ORDER — PHENYLEPHRINE HCL 10 MG/ML IJ SOLN
INTRAMUSCULAR | Status: AC
  Filled 2018-04-22: qty 1

## 2018-04-22 MED ORDER — GABAPENTIN 600 MG PO TABS
300.0000 mg | ORAL_TABLET | Freq: Three times a day (TID) | ORAL | Status: DC
  Filled 2018-04-22 (×4): qty 0.5

## 2018-04-22 MED ORDER — SODIUM CHLORIDE 0.9 % IV BOLUS
1000.0000 mL | Freq: Once | INTRAVENOUS | Status: AC
  Administered 2018-04-22: 1000 mL via INTRAVENOUS

## 2018-04-22 MED ORDER — ONDANSETRON HCL 4 MG/2ML IJ SOLN: 4.0000 mg | Freq: Four times a day (QID) | INTRAMUSCULAR | Status: DC | PRN

## 2018-04-22 MED ORDER — ONDANSETRON 4 MG PO TBDP
4.0000 mg | ORAL_TABLET | Freq: Four times a day (QID) | ORAL | Status: DC | PRN
  Filled 2018-04-22: qty 1

## 2018-04-22 MED ORDER — METOPROLOL TARTRATE 5 MG/5ML IV SOLN: 2.5000 mg | Freq: Four times a day (QID) | INTRAVENOUS | Status: DC

## 2018-04-22 MED ORDER — ROCURONIUM BROMIDE 50 MG/5ML IV SOLN
INTRAVENOUS | Status: AC
  Filled 2018-04-22: qty 1

## 2018-04-22 MED ORDER — FAMOTIDINE IN NACL 20-0.9 MG/50ML-% IV SOLN: 20.0000 mg | Freq: Two times a day (BID) | INTRAVENOUS | Status: DC

## 2018-04-22 MED ORDER — ORAL CARE MOUTH RINSE
15.0000 mL | Freq: Two times a day (BID) | OROMUCOSAL | Status: DC
  Administered 2018-04-22 (×2): 15 mL via OROMUCOSAL

## 2018-04-22 MED ORDER — LACTATED RINGERS IV SOLN
INTRAVENOUS | Status: DC | PRN
  Administered 2018-04-22 (×2): via INTRAVENOUS

## 2018-04-22 MED ORDER — FENTANYL CITRATE (PF) 100 MCG/2ML IJ SOLN: 25.0000 ug | INTRAMUSCULAR | Status: DC | PRN

## 2018-04-22 MED ORDER — PHENYLEPHRINE HCL 10 MG/ML IJ SOLN
INTRAMUSCULAR | Status: DC | PRN
  Administered 2018-04-22: 100 ug via INTRAVENOUS

## 2018-04-22 MED ORDER — SUCCINYLCHOLINE CHLORIDE 20 MG/ML IJ SOLN
INTRAMUSCULAR | Status: AC
  Filled 2018-04-22: qty 1

## 2018-04-22 MED ORDER — PIPERACILLIN-TAZOBACTAM 3.375 G IVPB 30 MIN
3.3750 g | Freq: Once | INTRAVENOUS | Status: AC
  Administered 2018-04-22: 3.375 g via INTRAVENOUS
  Filled 2018-04-22: qty 50

## 2018-04-22 MED ORDER — LACTATED RINGERS IV SOLN
INTRAVENOUS | Status: DC
  Administered 2018-04-22 – 2018-04-23 (×2): via INTRAVENOUS

## 2018-04-22 MED ORDER — SODIUM CHLORIDE 0.9 % IV SOLN
INTRAVENOUS | Status: DC | PRN
  Administered 2018-04-22: 04:00:00 via INTRAVENOUS

## 2018-04-22 MED ORDER — FENTANYL BOLUS VIA INFUSION
25.0000 ug | INTRAVENOUS | Status: DC | PRN
  Filled 2018-04-22: qty 25

## 2018-04-22 MED ORDER — KETOROLAC TROMETHAMINE 15 MG/ML IJ SOLN
15.0000 mg | Freq: Four times a day (QID) | INTRAMUSCULAR | Status: AC
  Administered 2018-04-22: 15 mg via INTRAVENOUS
  Filled 2018-04-22: qty 1

## 2018-04-22 MED ORDER — PROCHLORPERAZINE EDISYLATE 10 MG/2ML IJ SOLN
5.0000 mg | Freq: Four times a day (QID) | INTRAMUSCULAR | Status: DC | PRN
  Filled 2018-04-22: qty 2

## 2018-04-22 MED ORDER — IPRATROPIUM-ALBUTEROL 0.5-2.5 (3) MG/3ML IN SOLN
3.0000 mL | Freq: Four times a day (QID) | RESPIRATORY_TRACT | Status: DC
  Administered 2018-04-22 – 2018-04-23 (×2): 3 mL via RESPIRATORY_TRACT
  Filled 2018-04-22 (×3): qty 3

## 2018-04-22 MED ORDER — FENTANYL CITRATE (PF) 100 MCG/2ML IJ SOLN
25.0000 ug | INTRAMUSCULAR | Status: DC | PRN
  Administered 2018-04-22 – 2018-04-23 (×6): 25 ug via INTRAVENOUS
  Filled 2018-04-22 (×6): qty 2

## 2018-04-22 MED ORDER — SODIUM CHLORIDE 0.9 % IJ SOLN
INTRAMUSCULAR | Status: AC
  Filled 2018-04-22: qty 50

## 2018-04-22 MED ORDER — BUPIVACAINE-EPINEPHRINE (PF) 0.25% -1:200000 IJ SOLN
INTRAMUSCULAR | Status: AC
  Filled 2018-04-22: qty 30

## 2018-04-22 MED ORDER — SODIUM CHLORIDE 0.9 % IV SOLN
1.0000 g | Freq: Two times a day (BID) | INTRAVENOUS | Status: DC
  Administered 2018-04-22 – 2018-04-23 (×2): 1 g via INTRAVENOUS
  Filled 2018-04-22 (×4): qty 1

## 2018-04-22 MED ORDER — SUCCINYLCHOLINE CHLORIDE 20 MG/ML IJ SOLN
INTRAMUSCULAR | Status: DC | PRN
  Administered 2018-04-22: 60 mg via INTRAVENOUS

## 2018-04-22 MED ORDER — ALBUMIN HUMAN 25 % IV SOLN
12.5000 g | Freq: Once | INTRAVENOUS | Status: AC
  Administered 2018-04-22: 12.5 g via INTRAVENOUS
  Filled 2018-04-22: qty 50

## 2018-04-22 MED ORDER — ROCURONIUM BROMIDE 100 MG/10ML IV SOLN
INTRAVENOUS | Status: DC | PRN
  Administered 2018-04-22: 10 mg via INTRAVENOUS
  Administered 2018-04-22: 20 mg via INTRAVENOUS

## 2018-04-22 MED ORDER — NOREPINEPHRINE 4 MG/250ML-% IV SOLN
0.0000 ug/min | INTRAVENOUS | Status: DC
  Administered 2018-04-22: 28 ug/min via INTRAVENOUS
  Administered 2018-04-22: 30 ug/min via INTRAVENOUS
  Administered 2018-04-22: 2 ug/min via INTRAVENOUS
  Administered 2018-04-22: 28 ug/min via INTRAVENOUS
  Filled 2018-04-22 (×4): qty 250

## 2018-04-22 MED ORDER — FENTANYL CITRATE (PF) 100 MCG/2ML IJ SOLN
INTRAMUSCULAR | Status: AC
  Administered 2018-04-22: 50 ug via INTRAVENOUS
  Filled 2018-04-22: qty 2

## 2018-04-22 MED ORDER — FENTANYL CITRATE (PF) 100 MCG/2ML IJ SOLN
INTRAMUSCULAR | Status: AC
  Administered 2018-04-22: 25 ug via INTRAVENOUS
  Filled 2018-04-22: qty 2

## 2018-04-22 MED ORDER — MAGNESIUM SULFATE 2 GM/50ML IV SOLN
2.0000 g | Freq: Once | INTRAVENOUS | Status: AC
  Administered 2018-04-22: 2 g via INTRAVENOUS

## 2018-04-22 MED ORDER — ONDANSETRON HCL 4 MG/2ML IJ SOLN
INTRAMUSCULAR | Status: DC | PRN
  Administered 2018-04-22: 4 mg via INTRAVENOUS

## 2018-04-22 MED ORDER — FENTANYL CITRATE (PF) 100 MCG/2ML IJ SOLN
INTRAMUSCULAR | Status: DC | PRN
  Administered 2018-04-22 (×4): 25 ug via INTRAVENOUS

## 2018-04-22 MED ORDER — FENTANYL CITRATE (PF) 100 MCG/2ML IJ SOLN
50.0000 ug | Freq: Once | INTRAMUSCULAR | Status: AC
  Administered 2018-04-22: 50 ug via INTRAVENOUS

## 2018-04-22 MED ORDER — BUPIVACAINE LIPOSOME 1.3 % IJ SUSP
INTRAMUSCULAR | Status: AC
  Filled 2018-04-22: qty 20

## 2018-04-22 MED ORDER — MIDAZOLAM HCL 2 MG/2ML IJ SOLN: 1.0000 mg | INTRAMUSCULAR | Status: DC | PRN

## 2018-04-22 MED ORDER — LIDOCAINE HCL (CARDIAC) PF 100 MG/5ML IV SOSY
PREFILLED_SYRINGE | INTRAVENOUS | Status: DC | PRN
  Administered 2018-04-22: 100 mg via INTRAVENOUS

## 2018-04-22 MED ORDER — IPRATROPIUM-ALBUTEROL 0.5-2.5 (3) MG/3ML IN SOLN: 3.0000 mL | RESPIRATORY_TRACT | Status: DC | PRN

## 2018-04-22 MED ORDER — LIDOCAINE HCL (PF) 2 % IJ SOLN
INTRAMUSCULAR | Status: AC
  Filled 2018-04-22: qty 10

## 2018-04-22 MED ORDER — SODIUM CHLORIDE 0.9 % IV SOLN
INTRAVENOUS | Status: AC
  Administered 2018-04-22: 09:00:00 via INTRAVENOUS

## 2018-04-22 MED ORDER — SODIUM CHLORIDE 0.9% FLUSH
10.0000 mL | Freq: Two times a day (BID) | INTRAVENOUS | Status: DC
  Administered 2018-04-22: 10 mL

## 2018-04-22 MED ORDER — PROPOFOL 10 MG/ML IV BOLUS
INTRAVENOUS | Status: DC | PRN
  Administered 2018-04-22: 50 mg via INTRAVENOUS
  Administered 2018-04-22: 100 mg via INTRAVENOUS

## 2018-04-22 MED ORDER — PROCHLORPERAZINE MALEATE 10 MG PO TABS
10.0000 mg | ORAL_TABLET | Freq: Four times a day (QID) | ORAL | Status: DC | PRN
  Filled 2018-04-22: qty 1

## 2018-04-22 MED ORDER — FENTANYL CITRATE (PF) 100 MCG/2ML IJ SOLN: 50.0000 ug | Freq: Once | INTRAMUSCULAR | Status: DC

## 2018-04-22 MED ORDER — VANCOMYCIN HCL IN DEXTROSE 750-5 MG/150ML-% IV SOLN
750.0000 mg | INTRAVENOUS | Status: DC
  Administered 2018-04-23: 750 mg via INTRAVENOUS
  Filled 2018-04-22: qty 150

## 2018-04-22 MED ORDER — FAMOTIDINE IN NACL 20-0.9 MG/50ML-% IV SOLN
20.0000 mg | Freq: Every day | INTRAVENOUS | Status: DC
  Administered 2018-04-22: 20 mg via INTRAVENOUS
  Filled 2018-04-22: qty 50

## 2018-04-22 MED ORDER — INSULIN ASPART 100 UNIT/ML ~~LOC~~ SOLN: 0.0000 [IU] | SUBCUTANEOUS | Status: DC

## 2018-04-22 MED ORDER — VANCOMYCIN HCL IN DEXTROSE 1-5 GM/200ML-% IV SOLN
1000.0000 mg | Freq: Once | INTRAVENOUS | Status: AC
  Administered 2018-04-22: 1000 mg via INTRAVENOUS
  Filled 2018-04-22: qty 200

## 2018-04-22 MED ORDER — METHYLENE BLUE 0.5 % INJ SOLN
INTRAVENOUS | Status: AC
Start: 2018-04-22 — End: ?
  Filled 2018-04-22: qty 10

## 2018-04-22 MED ORDER — BUPIVACAINE LIPOSOME 1.3 % IJ SUSP
INTRAMUSCULAR | Status: DC | PRN
  Administered 2018-04-22: 70 mL

## 2018-04-22 MED ORDER — VASOPRESSIN 20 UNIT/ML IV SOLN
INTRAVENOUS | Status: AC
  Filled 2018-04-22: qty 1

## 2018-04-22 MED ORDER — ACETAMINOPHEN 10 MG/ML IV SOLN
1000.0000 mg | Freq: Four times a day (QID) | INTRAVENOUS | Status: AC
  Administered 2018-04-22 – 2018-04-23 (×4): 1000 mg via INTRAVENOUS
  Filled 2018-04-22 (×7): qty 100

## 2018-04-22 MED ORDER — FENTANYL 2500MCG IN NS 250ML (10MCG/ML) PREMIX INFUSION
25.0000 ug/h | INTRAVENOUS | Status: DC
  Filled 2018-04-22: qty 250

## 2018-04-22 MED ORDER — VASOPRESSIN 20 UNIT/ML IV SOLN
INTRAVENOUS | Status: DC | PRN
Start: 2018-04-22 — End: 2018-04-22
  Administered 2018-04-22 (×3): 1 [IU] via INTRAVENOUS
  Administered 2018-04-22: 2 [IU] via INTRAVENOUS
  Administered 2018-04-22 (×2): 1 [IU] via INTRAVENOUS
  Administered 2018-04-22: 2 [IU] via INTRAVENOUS
  Administered 2018-04-22 (×2): 1 [IU] via INTRAVENOUS
  Administered 2018-04-22: 2 [IU] via INTRAVENOUS

## 2018-04-22 MED ORDER — OXYCODONE HCL 5 MG PO TABS: 5.0000 mg | ORAL_TABLET | ORAL | Status: DC | PRN

## 2018-04-22 MED ORDER — FENTANYL CITRATE (PF) 100 MCG/2ML IJ SOLN
25.0000 ug | Freq: Once | INTRAMUSCULAR | Status: AC
  Administered 2018-04-22: 25 ug via INTRAVENOUS

## 2018-04-22 MED ORDER — NALOXONE HCL 0.4 MG/ML IJ SOLN
0.4000 mg | Freq: Once | INTRAMUSCULAR | Status: AC
  Administered 2018-04-22: 0.4 mg via INTRAVENOUS

## 2018-04-22 MED ORDER — FENTANYL CITRATE (PF) 100 MCG/2ML IJ SOLN
INTRAMUSCULAR | Status: AC
  Filled 2018-04-22: qty 2

## 2018-04-22 MED ORDER — HEPARIN SODIUM (PORCINE) 5000 UNIT/ML IJ SOLN
5000.0000 [IU] | Freq: Three times a day (TID) | INTRAMUSCULAR | Status: DC
  Administered 2018-04-22 – 2018-04-23 (×4): 5000 [IU] via SUBCUTANEOUS
  Filled 2018-04-22 (×2): qty 1

## 2018-04-22 MED ORDER — KETOROLAC TROMETHAMINE 15 MG/ML IJ SOLN
15.0000 mg | Freq: Four times a day (QID) | INTRAMUSCULAR | Status: DC | PRN
  Administered 2018-04-22 – 2018-04-23 (×2): 15 mg via INTRAVENOUS
  Filled 2018-04-22 (×2): qty 1

## 2018-04-22 MED ORDER — ONDANSETRON HCL 4 MG/2ML IJ SOLN
4.0000 mg | Freq: Once | INTRAMUSCULAR | Status: AC
  Administered 2018-04-22: 4 mg via INTRAVENOUS

## 2018-04-22 MED ORDER — LACTATED RINGERS IV SOLN
INTRAVENOUS | Status: DC | PRN
  Administered 2018-04-22: 05:00:00 via INTRAVENOUS

## 2018-04-22 MED ORDER — SODIUM CHLORIDE 0.9% FLUSH: 10.0000 mL | INTRAVENOUS | Status: DC | PRN

## 2018-04-22 MED ORDER — SUGAMMADEX SODIUM 500 MG/5ML IV SOLN
INTRAVENOUS | Status: DC | PRN
  Administered 2018-04-22: 200 mg via INTRAVENOUS

## 2018-04-22 MED ORDER — PROPOFOL 10 MG/ML IV BOLUS
INTRAVENOUS | Status: AC
  Filled 2018-04-22: qty 20

## 2018-04-22 MED ORDER — HYDROMORPHONE HCL 1 MG/ML IJ SOLN: 0.5000 mg | INTRAMUSCULAR | Status: DC | PRN

## 2018-04-22 MED ORDER — ONDANSETRON HCL 4 MG/2ML IJ SOLN
INTRAMUSCULAR | Status: AC
  Filled 2018-04-22: qty 2

## 2018-04-22 MED ORDER — FLUCONAZOLE IN SODIUM CHLORIDE 200-0.9 MG/100ML-% IV SOLN
200.0000 mg | INTRAVENOUS | Status: DC
  Administered 2018-04-22: 200 mg via INTRAVENOUS
  Filled 2018-04-22 (×2): qty 100

## 2018-04-22 MED ORDER — ONDANSETRON HCL 4 MG/2ML IJ SOLN: 4.0000 mg | Freq: Once | INTRAMUSCULAR | Status: DC | PRN

## 2018-04-22 MED ORDER — BUPIVACAINE-EPINEPHRINE (PF) 0.25% -1:200000 IJ SOLN
INTRAMUSCULAR | Status: DC | PRN
  Administered 2018-04-22: 30 mL

## 2018-04-22 MED ORDER — ONDANSETRON HCL 4 MG/2ML IJ SOLN
INTRAMUSCULAR | Status: AC
  Administered 2018-04-22: 4 mg via INTRAVENOUS
  Filled 2018-04-22: qty 2

## 2018-04-22 SURGICAL SUPPLY — 61 items
APPLIER CLIP 11 MED OPEN (CLIP) ×3
APPLIER CLIP 13 LRG OPEN (CLIP)
BLADE CLIPPER SURG (BLADE) IMPLANT
BLADE SURG 15 STRL LF DISP TIS (BLADE) ×1 IMPLANT
BLADE SURG 15 STRL SS (BLADE) ×2
BULB RESERV EVAC DRAIN JP 100C (MISCELLANEOUS) ×6 IMPLANT
CANISTER SUCT 1200ML W/VALVE (MISCELLANEOUS) ×3 IMPLANT
CANISTER SUCT 3000ML PPV (MISCELLANEOUS) ×3 IMPLANT
CHLORAPREP W/TINT 26ML (MISCELLANEOUS) ×3 IMPLANT
CLIP APPLIE 11 MED OPEN (CLIP) ×1 IMPLANT
CLIP APPLIE 13 LRG OPEN (CLIP) IMPLANT
CNTNR SPEC 2.5X3XGRAD LEK (MISCELLANEOUS) ×1
CONT SPEC 4OZ STER OR WHT (MISCELLANEOUS) ×2
CONTAINER SPEC 2.5X3XGRAD LEK (MISCELLANEOUS) ×1 IMPLANT
DRAIN CHANNEL JP 19F (MISCELLANEOUS) ×3 IMPLANT
DRAPE LAPAROTOMY 100X77 ABD (DRAPES) ×3 IMPLANT
DRAPE TABLE BACK 80X90 (DRAPES) ×3 IMPLANT
DRSG OPSITE POSTOP 4X12 (GAUZE/BANDAGES/DRESSINGS) ×3 IMPLANT
DRSG TEGADERM 2-3/8X2-3/4 SM (GAUZE/BANDAGES/DRESSINGS) IMPLANT
DRSG TEGADERM 4X4.75 (GAUZE/BANDAGES/DRESSINGS) ×12 IMPLANT
DRSG TELFA 3X8 NADH (GAUZE/BANDAGES/DRESSINGS) ×3 IMPLANT
ELECT BLADE 6.5 EXT (BLADE) ×3 IMPLANT
ELECT REM PT RETURN 9FT ADLT (ELECTROSURGICAL) ×3
ELECTRODE REM PT RTRN 9FT ADLT (ELECTROSURGICAL) ×1 IMPLANT
GAUZE SPONGE 4X4 12PLY STRL (GAUZE/BANDAGES/DRESSINGS) IMPLANT
GLOVE BIO SURGEON STRL SZ7 (GLOVE) ×12 IMPLANT
GOWN STRL REUS W/ TWL LRG LVL3 (GOWN DISPOSABLE) ×4 IMPLANT
GOWN STRL REUS W/TWL LRG LVL3 (GOWN DISPOSABLE) ×8
HANDLE SUCTION POOLE (INSTRUMENTS) ×1 IMPLANT
HANDLE YANKAUER SUCT BULB TIP (MISCELLANEOUS) ×3 IMPLANT
HEMOSTAT SURGICEL 2X14 (HEMOSTASIS) ×3 IMPLANT
HEMOSTAT SURGICEL 2X3 (HEMOSTASIS) ×3 IMPLANT
LIGASURE IMPACT 36 18CM CVD LR (INSTRUMENTS) ×3 IMPLANT
NEEDLE HYPO 22GX1.5 SAFETY (NEEDLE) ×6 IMPLANT
NEEDLE HYPO 25X1 1.5 SAFETY (NEEDLE) IMPLANT
NS IRRIG 1000ML POUR BTL (IV SOLUTION) ×12 IMPLANT
PACK BASIN MAJOR ARMC (MISCELLANEOUS) ×3 IMPLANT
RELOAD PROXIMATE 75MM BLUE (ENDOMECHANICALS) IMPLANT
SPONGE DRAIN TRACH 4X4 STRL 2S (GAUZE/BANDAGES/DRESSINGS) ×6 IMPLANT
SPONGE LAP 18X18 5 PK (GAUZE/BANDAGES/DRESSINGS) ×12 IMPLANT
SPONGE LAP 18X36 2PK (MISCELLANEOUS) ×3 IMPLANT
STAPLER PROXIMATE 75MM BLUE (STAPLE) IMPLANT
STAPLER SKIN PROX 35W (STAPLE) ×3 IMPLANT
SUCTION POOLE HANDLE (INSTRUMENTS) ×3
SUT PDS AB 0 CT1 27 (SUTURE) ×9 IMPLANT
SUT PDS AB 1 TP1 96 (SUTURE) ×6 IMPLANT
SUT PROLENE 5 0 RB 1 DA (SUTURE) ×6 IMPLANT
SUT SILK 2 0 (SUTURE) ×2
SUT SILK 2 0 SH CR/8 (SUTURE) ×3 IMPLANT
SUT SILK 2 0SH CR/8 30 (SUTURE) ×3 IMPLANT
SUT SILK 2-0 18XBRD TIE 12 (SUTURE) ×1 IMPLANT
SUT VIC AB 0 CT1 36 (SUTURE) ×6 IMPLANT
SUT VIC AB 2-0 SH 27 (SUTURE) ×4
SUT VIC AB 2-0 SH 27XBRD (SUTURE) ×2 IMPLANT
SWAB DUAL CULTURE TRANS RED ST (MISCELLANEOUS) ×6 IMPLANT
SYR 30ML LL (SYRINGE) ×6 IMPLANT
SYR 3ML LL SCALE MARK (SYRINGE) IMPLANT
SYR BULB IRRIG 60ML STRL (SYRINGE) ×3 IMPLANT
SYRINGE IRR TOOMEY STRL 70CC (SYRINGE) ×3 IMPLANT
TAPE MICROFOAM 4IN (TAPE) IMPLANT
TRAY FOLEY W/METER SILVER 16FR (SET/KITS/TRAYS/PACK) ×3 IMPLANT

## 2018-04-22 NOTE — Anesthesia Post-op Follow-up Note (Signed)
Anesthesia QCDR form completed.        

## 2018-04-22 NOTE — Progress Notes (Signed)
Multiple attempts have been made to contact family, unable to do so, voicemail full/unavailable.

## 2018-04-22 NOTE — Progress Notes (Signed)
   04/25/2018 1950  Clinical Encounter Type  Visited With Patient  Visit Type Follow-up  Spiritual Encounters  Spiritual Needs Prayer   Chaplain checked in with patient and brought prayer shawl.  Patient reported being in pain and not wanting a visitor at this time, declined prayer shawl.  Patient praying aloud for God to relieve pain.  Chaplain offered silent prayer for patient and excused herself.

## 2018-04-22 NOTE — ED Notes (Signed)
Report to crystal, rn in or.

## 2018-04-22 NOTE — Progress Notes (Signed)
   05/06/2018 1120  Clinical Encounter Type  Visited With Patient  Visit Type Follow-up  Spiritual Encounters  Spiritual Needs Prayer   Based on report from previous on-call chaplain, this chaplain visited with patient.  She spoke of level of pain and difficulty of past night and morning.  Patient requested prayer then rest.  Chaplain prayed with patient, let her know of ongoing chaplain availability and offered to check back in later today/tonight to see how patient was doing.

## 2018-04-22 NOTE — ED Notes (Signed)
Patient transported to CT 

## 2018-04-22 NOTE — Anesthesia Postprocedure Evaluation (Signed)
Anesthesia Post Note  Patient: Caitlin Mason  Procedure(s) Performed: EXPLORATORY LAPAROTOMY, Pancreas Biopsy (N/A Abdomen) CHOLECYSTECTOMY (N/A Abdomen)  Patient location during evaluation: PACU Anesthesia Type: General Level of consciousness: awake and alert Pain management: pain level controlled Vital Signs Assessment: post-procedure vital signs reviewed and stable Respiratory status: spontaneous breathing, nonlabored ventilation, respiratory function stable and patient connected to nasal cannula oxygen Cardiovascular status: blood pressure returned to baseline and stable Postop Assessment: no apparent nausea or vomiting Anesthetic complications: no     Last Vitals:  Vitals:   05/06/2018 0830 04/20/2018 0845  BP: (!) 121/45 (!) 103/50  Pulse: 75 74  Resp: 19 18  Temp:    SpO2: 100% 100%    Last Pain:  Vitals:   05/15/2018 0800  TempSrc:   PainSc: 0-No pain                 Kealan Buchan S

## 2018-04-22 NOTE — Progress Notes (Addendum)
Name: Caitlin Mason MRN: [id number] DOB: 02-07-1935     CONSULTATION DATE: 05/07/2018    HISTORY OF PRESENT ILLNESS:   S/p  ex lap. Developed altered mental status to Fentanyl and responded to Narcan  PAST MEDICAL HISTORY :   has a past medical history of Actinic keratosis, Cataract, Cholelithiasis, COPD (chronic obstructive pulmonary disease) ([place]), Cystoid macular degeneration of right eye ([place] 2012), Depression, Diverticulosis, HLD (hyperlipidemia), HTN (hypertension), Low back pain, Osteoporosis, and Plantar fasciitis.  has a past surgical history that includes Colonoscopy; Vesicovaginal fistula closure w/ TAH; Abdominal hysterectomy; [facility] (Oct 2011); and Breast excisional biopsy (Bilateral, 1954). Prior to Admission medications   Medication Sig Start Date End Date Taking? Authorizing Provider  albuterol (PROAIR HFA) 108 (90 Base) MCG/ACT inhaler Inhale 2 puffs into the lungs every 6 (six) hours as needed for wheezing or shortness of breath. 12/26/17   Crecencio Mc, MD  ALPRAZolam Duanne Moron) 0.5 MG tablet TAKE ONE TABLET BY MOUTH AT BEDTIME AS NEEDED FOR ANXIETY 01/12/18   Crecencio Mc, MD  aspirin 81 MG EC tablet Take 81 mg by mouth daily.      [provider]  fexofenadine (ALLEGRA) 180 MG tablet TAKE ONE TABLET BY MOUTH EVERY DAY 01/11/18   Crecencio Mc, MD  lactulose (CHRONULAC) 10 GM/15ML solution TAKE 30 ML (2 TABLESPOONSFUL) BY MOUTH EVERY 4 HOURS UNTIL CONSTIPATION IS RELIEVED 08/05/16   Crecencio Mc, MD  metoprolol succinate (TOPROL-XL) 25 MG 24 hr tablet TAKE ONE TABLET BY MOUTH EVERY DAY 01/11/18   Crecencio Mc, MD  Multiple Vitamins-Minerals (MULTIVITAMIN ADULT PO) Take 1 tablet by mouth daily.    [provider]  omeprazole (PRILOSEC) 40 MG capsule TAKE ONE CAPSULE BY MOUTH EVERY DAY 01/11/18   Crecencio Mc, MD  sertraline (ZOLOFT) 50 MG tablet TAKE ONE TABLET BY MOUTH EVERY DAY 01/11/18   Crecencio Mc, MD  simvastatin (ZOCOR) 40  MG tablet Take 1 tablet (40 mg total) by mouth daily. 04/09/18   Crecencio Mc, MD   Allergies  Allergen Reactions  . Codeine     REACTION: giup set  . Demerol Nausea And Vomiting  . Hctz [Hydrochlorothiazide] Other (See Comments)    Hyponatremia   . Levaquin [Levofloxacin In D5w] Nausea And Vomiting  . Mirtazapine     Caused severe hyperactivity patient could not sleep.  Marland Kitchen Propoxyphene N-Acetaminophen     REACTION: gi upset  . Sulfonamide Derivatives     REACTION: gi upset    FAMILY HISTORY:  family history includes Aneurysm in her father; Breast cancer in her maternal aunt; Breast cancer (age of onset: 100) in her mother; Gallbladder disease in her maternal aunt; Lung cancer in her maternal uncle. SOCIAL HISTORY:  reports that she quit smoking about 32 years ago. Her smoking use included cigarettes. She has a 25.00 pack-year smoking history. She has never used smokeless tobacco. She reports that she does not drink alcohol or use drugs.  REVIEW OF SYSTEMS:   Unable to obtain due to critical illness   VITAL SIGNS: Temp:  [95.6 F (35.3 C)-99 F (37.2 C)] 97.1 F (36.2 C) (05/05 1600) Pulse Rate:  [55-81] 65 (05/05 1700) Resp:  [0-22] 20 (05/05 1700) BP: (54-143)/(27-82) 120/59 (05/05 1700) SpO2:  [89 %-100 %] 97 % (05/05 1700) FiO2 (%):  [28 %] 28 % (05/05 1600) Weight:  [49.9 kg (110 lb)-58.1 kg (128 lb 1.4 oz)] 58.1 kg (128 lb 1.4 oz) (05/05  0734)  Physical Examination:  A + O, lethargic and no acute focal neuro deficits On [place],no distress, able to talk in full sentences. BEAE and no rales S1 & S2 audible and no murmur S/p laparotomy. No peristalses No leg edema ASSESSMENT / PLAN:  Biliary peritonitis, perforated bowel, panreatitits s/p ex lap + cholecystectomy + debridement of pancreatic necrotic tissue + Takedown of splenic flexure. Found to have pancreatic duct disruption. Case was discussed with Dr. Vicente Males (GI) who advised MRI/MRCP.  -As per Dr. Vicente Males he had  discussed the case with Dr. Allen Norris. Dr. Allen Norris who doesn't do this particular procedure and he in turn had got in touch with [facility]es advanced endoscopist who recommended transfer to [facility] or [facility].   -Case was discussed with Dr Burt Knack (surgery) who agreed to the plan of care and transfer. Family was updated and they preferred [facility] as the patient had received care there before.  -Case was discussed with Dr. Dietrich Pates (Surgery at [facility] on call) who requested discussing the care with a surgeon Renford Dills to Surgeon call). Dr. Burt Knack was notified and updated me later that he had discussed the case with Dr. Dietrich Pates (surgeon on call at [facility]) and awaiting her decision regarding accepting the case. -I followed up with Dr. Burt Knack who was still awaiting Dr. Sharalyn Ink calling back. -Later, Dr. Dietrich Pates called me and declined accepting the case for transfer, she mentioned that she discussed the case with hepatobiliary team at [facility], they advised placing a stent in the ampulla of Vater after resolution of acute phase in 48-72 hs. I advised Dr. Dietrich Pates to communicate her decision with the surgeon Dr. Burt Knack and call was dropped. -Dr. Burt Knack was notified with Dr. Valla Leaver Decision, he had agreed to and mentioned he would call her after finishing a case in OR. -[facility] transfer center called back and I advised that I spoke with Dr. Dietrich Pates however she will need to communicate with Dr. Burt Knack (Surgeon to Surgeon) who has been awaiting her call to discuss / advise care. -Dr.Anna was notified with Dr. Dietrich Pates recommendations and mentioned he will discuss with Dr. Allen Norris and follow with [facility].   -GNR and GPC on fluid culture. Vanc + Mero + Fluconazol. Follow with cultures and ID consult  Hypotension with septic shock / meds. -Optimize volume resuscitation, pressers and monitor hemodynamics  Altered mental status due to Fentanyl responded to Narcan. No focal neuro deficit -Optimize analgesia with non narcotic  analgesics.  COPD -Bronchodilators  Depression -Supportive care  Dyslipidemia -Statins on hold as NPO Full  DVT & GI prophylaxis. Continue with supportive care  Family was update with the patient condition and the agreed to the plan of care.

## 2018-04-22 NOTE — Progress Notes (Signed)
[facility] Progress Note Patient Name: Caitlin Mason DOB: 1935/04/10 MRN: [id number]   Date of Service  05/02/2018  HPI/Events of Note  Lactic Acid level = 3.6. Admitted for necrotizing pancreatitis.   eICU Interventions  Will order: 1. Bolus with 0.9 NaCl 1 liter IV over 1 hour now.  2. Monitor CVP.     Intervention Category Major Interventions: Acid-Base disturbance - evaluation and management  Sommer,Steven Eugene 05/15/2018, 9:07 PM

## 2018-04-22 NOTE — Progress Notes (Signed)
D/w family in detail about the operation this am. [facility] recommends holding off for ERCP 48-72hrs. IF Dr. Allen Norris recommends having the ERCP done by a [facility] facility we will have to transfer her either to [facility] or call back to [facility].  Family aware.

## 2018-04-22 NOTE — Consult Note (Signed)
Pharmacy Antibiotic Note  Caitlin Mason is a 83 y.o. female admitted on 05/02/2018 with necrotizing pancreatitis.  Pharmacy has been consulted for meropenem dosing.  Plan: Start Meropenem 1g IV every 12 hours based on current CrCl <48ml/min   Height: 5' (152.4 cm) Weight: 128 lb 1.4 oz (58.1 kg) IBW/kg (Calculated) : 45.5  Temp (24hrs), Avg:97.4 F (36.3 C), Min:95.6 F (35.3 C), Max:99 F (37.2 C)  Recent Labs  Lab 04/21/2018 0100 05/02/2018 0805 04/21/2018 0827 04/18/2018 1119  WBC 6.7 1.4*  --   --   CREATININE 0.93 0.90  --   --   LATICACIDVEN 1.6  --  4.9* 2.9*    Estimated Creatinine Clearance: 39.1 mL/min (by C-G formula based on SCr of 0.9 mg/dL).    Allergies  Allergen Reactions  . Codeine     REACTION: giup set  . Demerol Nausea And Vomiting  . Hctz [Hydrochlorothiazide] Other (See Comments)    Hyponatremia   . Levaquin [Levofloxacin In D5w] Nausea And Vomiting  . Mirtazapine     Caused severe hyperactivity patient could not sleep.  Marland Kitchen Propoxyphene N-Acetaminophen     REACTION: gi upset  . Sulfonamide Derivatives     REACTION: gi upset    Antimicrobials this admission: 5/5 Zosyn 3.375  >> x 1  5/5 meropenem >>    Microbiology results: 5/5 BCx: pending 5/5 MRSA PCR: neg   Thank you for allowing pharmacy to be a part of this patient's care.  Pernell Dupre, PharmD, BCPS Clinical Pharmacist 05/09/2018 2:04 PM

## 2018-04-22 NOTE — Anesthesia Preprocedure Evaluation (Signed)
Anesthesia Evaluation  Patient identified by MRN, date of birth, ID band Patient awake    Reviewed: Allergy & Precautions, NPO status , Patient's Chart, lab work & pertinent test results, reviewed documented beta blocker date and time   Airway Mallampati: II  TM Distance: >3 FB     Dental  (+) Chipped   Pulmonary COPD, former smoker,           Cardiovascular hypertension, Pt. on medications and Pt. on home beta blockers      Neuro/Psych PSYCHIATRIC DISORDERS Anxiety Depression  Neuromuscular disease    GI/Hepatic   Endo/Other    Renal/GU      Musculoskeletal   Abdominal   Peds  Hematology   Anesthesia Other Findings   Reproductive/Obstetrics                             Anesthesia Physical Anesthesia Plan  ASA: III  Anesthesia Plan: General   Post-op Pain Management:    Induction:   PONV Risk Score and Plan:   Airway Management Planned: Oral ETT  Additional Equipment:   Intra-op Plan:   Post-operative Plan:   Informed Consent: I have reviewed the patients History and Physical, chart, labs and discussed the procedure including the risks, benefits and alternatives for the proposed anesthesia with the patient or authorized representative who has indicated his/her understanding and acceptance.     Plan Discussed with: CRNA  Anesthesia Plan Comments:         Anesthesia Quick Evaluation

## 2018-04-22 NOTE — Progress Notes (Signed)
   05/02/2018 1640  Clinical Encounter Type  Visited With Patient and family together  Visit Type Follow-up  Spiritual Encounters  Spiritual Needs Emotional;Prayer   While rounding on unit, chaplain followed up with patient.  Chaplain spoke with [name] about patient's possible move to another hospital and recent events with pain control and medication for patient.  Kathy's significant other Caitlin Mason came into conversation as well.  Chaplain spoke with patient one-to-one; patient expressed ongoing pain yet prays throughout.  Chaplain led prayers with patient and will follow up again with patient and family.

## 2018-04-22 NOTE — ED Notes (Signed)
Pt placed on oxygen at 2lpm for ra pox of 90%.

## 2018-04-22 NOTE — Progress Notes (Signed)
Spoke with Vicente Males RN re PICC to be placed by CVW.

## 2018-04-22 NOTE — ED Notes (Signed)
Pt returned to room from CT

## 2018-04-22 NOTE — Op Note (Signed)
PROCEDURES: 1. Laparotomy 2. Debridement of pancreatic necrosis 3. Cholecystectomy 4. Placement of 19 FR blake drains x2 5. Takedown of splenic flexure  Pre-operative Diagnosis: Free air  Post-operative Diagnosis: Biliary peritonitis from pancreatitis  Surgeon: Hutchinson    Anesthesia: General endotracheal anesthesia  ASA Class: 3   Surgeon: Caroleen Hamman , MD FACS  Anesthesia: Gen. with endotracheal tube  Findings: Biliary peritonitis Pancreatitis w disrupted duct body and tail of the pancreas No evidence of bowel perforation Necrotic tissue eroding into the mesentery of the large bowel and omentum  Estimated Blood Loss: 250cc         Drains: x 2         Specimens: GB and necrotic tissue        Complications: none               Condition: stable  Procedure Details  The patient was seen again in the Holding Room. The benefits, complications, treatment options, and expected outcomes were discussed with the patient. The risks of bleeding, infection, recurrence of symptoms, failure to resolve symptoms,  bowel injury, any of which could require further surgery were reviewed with the patient.   The patient was taken to Operating Room, identified as Caitlin Mason and the procedure verified.  A Time Out was held and the above information confirmed.  Prior to the induction of general anesthesia, antibiotic prophylaxis was administered. VTE prophylaxis was in place. General endotracheal anesthesia was then administered and tolerated well. After the induction, the abdomen was prepped with Chloraprep and draped in the sterile fashion. The patient was positioned in the supine position.   Generous midline laparotomy was performed with a 10 blade knife and electrocautery was used to dissected through subtenons tissue and the fascia was incised.  The abdomen was entered under direct visualization avoiding injury to bowel.  There was evidence of murky fluid and bilious fluid.   This was appropriately culture.  Attention was turned to the stomach and there was evidence of inflammatory component along the omentum and the greater omentum was incised to access the lesser sac.  On the posterior aspect of the lesser sac there was definitely some inflammatory response around the pancreas specifically between the body and the tail the pancreas.  We proceeded to perform a Kocher maneuver to make sure there was no evidence of any dural duodenal perforation, and was found.  The small bowel was run from ligament of Treitz all the way to the terminal ileum failing to identify of any site of perforation or ischemia.  Attention then was turned to the colon and around the splenic flexure there was some necrosis.  The Hispanic flexure was taken down with electrocautery to make sure there was no evidence of any colon injury.  This was not the case.  In fact all this was from retroperitoneal extension of the pancreatitis and the pancreatic necrosis. Attention was turned to the pancreas There was evidence of some tissue necrosis in the periareolar tissue.  This was debrided using finger fracture and some electrocautery.  Please note that most of the necrosis was from the mesentery the large bowel and omentum and there was small amount of necrosis from the pancreas itself.  We did visualize a small continuous leak of bile from the pancreas suggesting pancreatic ductal injury. I decided since the patient was hemodynamically stable to perform a cholecystectomy since there was a history of gallstones and no history of alcohol abuse so this is thought  to be gallstone pancreatitis.  The bladder was grasped with a ring forceps and using the dome down technique electrocautery was used to remove the gallbladder from the soft fossa.  The cystic duct and cystic artery were identified and double clip and divided in the standard fashion.  There was no evidence of impingement of the common bile duct.  We did encounter  some bleeding from the liver that were able to control with a combination of cautery pressure and a Prolene stitch. Good hemostasis was achieved  The cavity was irrigated with several liters of normal saline until there was clear return.  2 drains were placed one within the lesser sac to the left side on top of the pancreas and the one on the right side going from the gallbladder fossa all the way to the anterior portion of the stomach and cursing next to the duodenum.   The drains were sutured in place with a 2-0 silk.  We changed gloves and close the abdomen with a 0 PDS suture in a running fashion and the skin was closed with staples. Liposomal Marcaine was injected throughout the  incision site under direct visualization. Needle and laparotomy count were correct and there were no immediate complications.  Caroleen Hamman, MD, FACS

## 2018-04-22 NOTE — ED Provider Notes (Signed)
[facility] Emergency Department Provider Note  ____________________________________________  Time seen: Approximately 1:46 AM  I have reviewed the triage vital signs and the nursing notes.   HISTORY  Chief Complaint Abdominal Pain    HPI Caitlin Mason is a 83 y.o. female with a history of hypertension and hyperlipidemia who reports sudden onset of generalized abdominal pain that started 3-4 hours prior to arrival after eating dinner. No unusual foods with her dinner. Never had anything like this before. Pain is generalized, severe, nonradiating, no aggravating or alleviating factors. No vomiting or diarrhea but she does have some nausea. No fevers chills or sweats.   EMS noted a borderline low blood pressure of about 90/50 which improved with IV fluids.   Past Medical History:  Diagnosis Date  . Actinic keratosis    back, upper extremities: s/p liquid nitro 01/29/06  . Cataract    s/p extraction 2007, 2005  . Cholelithiasis    asymptomatic  . COPD (chronic obstructive pulmonary disease) ([place])   . 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 Depression   . Diverticulosis   . HLD (hyperlipidemia)   . HTN (hypertension)   . Low back pain   . Osteoporosis   . Plantar fasciitis    Morton's Neuroma     Patient Active Problem List   Diagnosis Date Noted  . Dysuria 04/17/2018  . Mild cognitive impairment with memory loss 01/13/2018  . Prediabetes 01/13/2018  . Inguinal hernia of left side without obstruction or gangrene 09/09/2017  . Palpitations 06/29/2017  . Generalized anxiety disorder 07/30/2016  . Loss of weight 06/09/2016  . Dry skin dermatitis 06/07/2016  . Elevated CA-125 04/15/2015  . Constipation 04/13/2015  . Diverticulosis of colon without hemorrhage 04/13/2015  . Essential hypertension, benign 05/18/2014  . LLQ pain 07/29/2013  . Encounter for Medicare annual wellness exam 05/14/2013  .  Chronic low back pain with left-sided sciatica 08/18/2012  . S/P bilateral cataract extraction   . COPD (chronic obstructive pulmonary disease) ([place])   . Cystoid macular degeneration of right eye   . Insomnia, persistent 08/16/2011  . Hyperlipidemia 09/30/2010     Past Surgical History:  Procedure Laterality Date  . ABDOMINAL HYSTERECTOMY    . BREAST EXCISIONAL BIOPSY Bilateral 1954   NEG  . COLONOSCOPY     3/09, one sigmoid polyp; no tics   . NM MYOVIEW LTD  Oct 2011   normal, EF 89%, study limited by lung disease  . VESICOVAGINAL FISTULA CLOSURE W/ TAH       Prior to Admission medications   Medication Sig Start Date End Date Taking? Authorizing Provider  albuterol (PROAIR HFA) 108 (90 Base) MCG/ACT inhaler Inhale 2 puffs into the lungs every 6 (six) hours as needed for wheezing or shortness of breath. 12/26/17   Crecencio Mc, MD  ALPRAZolam Duanne Moron) 0.5 MG tablet TAKE ONE TABLET BY MOUTH AT BEDTIME AS NEEDED FOR ANXIETY 01/12/18   Crecencio Mc, MD  aspirin 81 MG EC tablet Take 81 mg by mouth daily.      [provider]  fexofenadine (ALLEGRA) 180 MG tablet TAKE ONE TABLET BY MOUTH EVERY DAY 01/11/18   Crecencio Mc, MD  lactulose (CHRONULAC) 10 GM/15ML solution TAKE 30 ML (2 TABLESPOONSFUL) BY MOUTH EVERY 4 HOURS UNTIL CONSTIPATION IS RELIEVED 08/05/16   Crecencio Mc, MD  metoprolol succinate (TOPROL-XL) 25 MG 24 hr tablet TAKE ONE TABLET BY MOUTH EVERY  DAY 01/11/18   Crecencio Mc, MD  Multiple Vitamins-Minerals (MULTIVITAMIN ADULT PO) Take 1 tablet by mouth daily.    [provider]  omeprazole (PRILOSEC) 40 MG capsule TAKE ONE CAPSULE BY MOUTH EVERY DAY 01/11/18   Crecencio Mc, MD  sertraline (ZOLOFT) 50 MG tablet TAKE ONE TABLET BY MOUTH EVERY DAY 01/11/18   Crecencio Mc, MD  simvastatin (ZOCOR) 40 MG tablet Take 1 tablet (40 mg total) by mouth daily. 04/09/18   Crecencio Mc, MD     Allergies Codeine; Demerol; Hctz [hydrochlorothiazide];  Levaquin [levofloxacin in d5w]; Mirtazapine; Propoxyphene n-acetaminophen; and Sulfonamide derivatives   Family History  Problem Relation Age of Onset  . Breast cancer Mother 35  . Aneurysm Father   . Lung cancer Maternal Uncle   . Gallbladder disease Maternal Aunt   . Breast cancer Maternal Aunt     Social History Social History   Tobacco Use  . Smoking status: Former Smoker    Packs/day: 1.00    Years: 25.00    Pack years: 25.00    Types: Cigarettes    Last attempt to quit: 08/15/1985    Years since quitting: 32.7  . Smokeless tobacco: Never Used  . Tobacco comment: quit 20 years ago -started at age 35  Substance Use Topics  . Alcohol use: No    Alcohol/week: 0.0 oz  . Drug use: No    Review of Systems  Constitutional:   No fever or chills.   Cardiovascular:   No chest pain or syncope. Respiratory:   No dyspnea or cough. Gastrointestinal:   positive as above for abdominal pain without vomiting and diarrhea.  Musculoskeletal:   Negative for focal pain or swelling All other systems reviewed and are negative except as documented above in ROS and HPI.  ____________________________________________   PHYSICAL EXAM:  VITAL SIGNS: ED Triage Vitals  Enc Vitals Group     BP 04/25/2018 0105 (!) 100/47     Pulse Rate 04/20/2018 0105 (!) 58     Resp 05/09/2018 0105 16     Temp 04/18/2018 0105 99 F (37.2 C)     Temp Source 05/01/2018 0105 Oral     SpO2 05/08/2018 0105 96 %     Weight 05/04/2018 0107 110 lb (49.9 kg)     Height 04/21/2018 0107 5' (1.524 m)     Head Circumference --      Peak Flow --      Pain Score 05/15/2018 0107 10     Pain Loc --      Pain Edu? --      Excl. in [place]? --     Vital signs reviewed, nursing assessments reviewed.   Constitutional:   Alert and oriented. Well appearing and in no distress. Eyes:   Conjunctivae are normal. EOMI. PERRL. ENT      Head:   Normocephalic and atraumatic.      Nose:   No congestion/rhinnorhea.       Mouth/Throat:   Dry  mucous membranes, no pharyngeal erythema. No peritonsillar mass.       Neck:   No meningismus. Full ROM. Hematological/Lymphatic/Immunilogical:   No cervical lymphadenopathy. Cardiovascular:   RRR. Symmetric bilateral radial and DP pulses.  No murmurs.  Respiratory:   Normal respiratory effort without tachypnea/retractions. Breath sounds are clear and equal bilaterally. No wheezes/rales/rhonchi. Gastrointestinal:   Soft with diffuse nonfocal tenderness.. Non distended.  No rebound, rigidity, or guarding.  Musculoskeletal:   Normal range of motion in  all extremities. No joint effusions.  No lower extremity tenderness.  No edema. Neurologic:   Normal speech and language.  Motor grossly intact. No acute focal neurologic deficits are appreciated.  Skin:    Skin is warm, dry and intact. No rash noted.  No petechiae, purpura, or bullae.  ____________________________________________    LABS (pertinent positives/negatives) (all labs ordered are listed, but only abnormal results are displayed) Labs Reviewed  COMPREHENSIVE METABOLIC PANEL - Abnormal; Notable for the following components:      Result Value   Sodium 134 (*)    Potassium 3.4 (*)    Chloride 99 (*)    Glucose, Bld 167 (*)    Calcium 8.8 (*)    AST 45 (*)    GFR calc non Af Amer 56 (*)    All other components within normal limits  LIPASE, BLOOD - Abnormal; Notable for the following components:   Lipase 942 (*)    All other components within normal limits  CBC WITH DIFFERENTIAL/PLATELET  LACTIC ACID, PLASMA   ____________________________________________   EKG  interpreted by me Sinus rhythm rate of 62, normal axis intervals QRS ST segments and T waves.  ____________________________________________    RADIOLOGY  Ct Abdomen Pelvis Wo Contrast  Result Date: 05/18/2018 CLINICAL DATA:  Diffuse abdominal pain EXAM: CT ABDOMEN AND PELVIS WITHOUT CONTRAST TECHNIQUE: Multidetector CT imaging of the abdomen and pelvis was  performed following the standard protocol without IV contrast. COMPARISON:  CT 10/27/2016, 04/20/2016 FINDINGS: Lower chest: Lung bases demonstrate no acute consolidation or pleural effusion. Heart size within normal limits. Trace pericardial effusion. Mild fluid-filled distention of the distal esophagus. Hepatobiliary: Small gallstones no biliary dilatation or focal hepatic abnormality. Pancreas: Large amount of peripancreatic gas.  No ductal dilatation Spleen: Normal in size without focal abnormality. Adrenals/Urinary Tract: Adrenal glands are within normal limits. No hydronephrosis. Small exophytic lesion mid left kidney, no change and likely benign. Mildly atrophic kidneys. Bladder is distended. Stomach/Bowel: Stomach nonenlarged. Abnormal wall thickening involving the duodenum and proximal jejunum. Sigmoid colon diverticular disease without acute inflammation. Vascular/Lymphatic: Moderate aortic atherosclerosis. No aneurysmal dilatation. 16 mm right femoral node. Reproductive: Status post hysterectomy. No adnexal masses. Other: Small foci of pneumoperitoneum at the porta hepatis. Small amount of complex ascites around the liver spleen and within the bilateral colic gutters with small foci of gas in the left gutter fluid. Moderate to large amount of extraluminal air, within the retroperitoneum around the pancreas with small amounts of pneumoperitoneum noted. Musculoskeletal: Grade 1 anterolisthesis of L4 on L5. Multilevel degenerative changes without acute or suspicious abnormality. IMPRESSION: 1. Findings consistent with hollow viscus perforation with moderate to large amount of retroperitoneal gas and small amounts of intraperitoneal free air. Potential source is abnormally thickened duodenum and proximal jejunal bowel loops; bowel thickening could be secondary to infection, inflammatory bowel disease, or ischemia. Could consider peptic ulcer disease although distribution of extraluminal gas is considered  somewhat atypical for this. 2. Small amount of complex ascites within the upper abdomen. 3. Gallstones 4. Sigmoid colon diverticular disease without acute inflammation Critical Value/emergent results were called by telephone at the time of interpretation on 04/24/2018 at 2:55 am to Dr. Carrie Mew , who verbally acknowledged these results. Electronically Signed   By: Donavan Foil M.D.   On: 04/25/2018 02:55    ____________________________________________   PROCEDURES .Critical Care Performed by: Carrie Mew, MD Authorized by: Carrie Mew, MD   Critical care provider statement:    Critical care time (minutes):  30  Critical care time was exclusive of:  Separately billable procedures and treating other patients   Critical care was necessary to treat or prevent imminent or life-threatening deterioration of the following conditions:  Shock and circulatory failure   Critical care was time spent personally by me on the following activities:  Development of treatment plan with patient or surrogate, discussions with consultants, evaluation of patient's response to treatment, examination of patient, obtaining history from patient or surrogate, ordering and performing treatments and interventions, ordering and review of laboratory studies, ordering and review of radiographic studies, pulse oximetry, re-evaluation of patient's condition and review of old charts    ____________________________________________  DIFFERENTIAL DIAGNOSIS   mesenteric ischemia, cholecystitis, AAA, bowel perforation, bowel obstruction, GERD  CLINICAL IMPRESSION / ASSESSMENT AND PLAN / ED COURSE  Pertinent labs & imaging results that were available during my care of the patient were reviewed by me and considered in my medical decision making (see chart for details).    patient presents with severe diffuse abdominal pain after eating. Exam is nonfocal but concerning with the degree of tenderness she has.  Unlikely dissection or mesenteric ischemia, I will check a lactic acid to screen for tissue ischemia. I doubt ACS PE or pneumonia. Patient is not septic.  Clinical Course as of Apr 22 312  Sun Apr 22, 2018  0249 CT d/w rads, notes perforated bowel with free air. Will start zosyn. C/s surgery.    [PS]  3212 D/w [place]. Agrees with abx. Will eval.    [PS]    Clinical Course User Index [PS] Carrie Mew, MD     ____________________________________________   FINAL CLINICAL IMPRESSION(S) / ED DIAGNOSES    Final diagnoses:  Generalized abdominal pain  Perforated bowel ([place])  Acute pancreatitis, unspecified complication status, unspecified pancreatitis type     ED Discharge Orders    None      Portions of this note were generated with dragon dictation software. Dictation errors may occur despite best attempts at proofreading.    Carrie Mew, MD 04/20/2018 [phone]

## 2018-04-22 NOTE — Progress Notes (Signed)
15 minute call to floor. 

## 2018-04-22 NOTE — Consult Note (Addendum)
Caitlin Mason , MD [address], [place], [place], [place], [id number] [place], [place], [place], [address] Phone: [phone]  Fax: [phone]  Consultation  Referring Provider: DR Dahlia Byes  Primary Care Physician:  Crecencio Mc, MD Primary Gastroenterologist: None        Reason for Consultation:  ERCP  Date of Admission:  04/30/2018 Date of Consultation:  05/06/2018         HPI:   Caitlin Mason is a 83 y.o. female was admitted on 05/17/2018 when she presented with abdominal pain to the ER subsequently was hypotensive.  She was found to have free air in the retroperitoneum and the concern was for perforated bowel versus necrotizing pancreatitis.  She underwent an exploratory laparotomy debridement of pancreatic necrosis, cholecystectomy takedown of splenic flexure drains were placed and biliary peritonitis, pancreatitis disrupted.body and tail of the  pancreas.  There was necrotic tissue eroding into the mesentery of the large bowel and omentum.  I have been called for ERCP.  CT scan of the abdomen performed today on admission prior to surgery showed small gallstones no biliary dilation or focal hepatic abnormality.  The pancreas showed no ductal dilation.  There was abnormally thickened duodenum and proximal jejunal bowel loops bowel thickening which felt could be secondary to infection inflammatory bowel disease or ischemia.  Small amount of complex ascites within the upper abdomen.  On admission the lipase is 942 with within normal total bilirubin of 0.6 and an AST of 45 and a normal alkaline phosphatase.  Hemoglobin of 13.7 g.  Lactic acid is 4.9   She was only able to provide a limited history . She was in pain . Said she had pain develop in her abdomen all of a sudden , she could not provide any other history   Past Medical History:  Diagnosis Date  . Actinic keratosis    back, upper extremities: s/p liquid nitro 01/29/06  . Cataract    s/p extraction 2007, 2005    . Cholelithiasis    asymptomatic  . COPD (chronic obstructive pulmonary disease) ([facility])   . 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 Depression   . Diverticulosis   . HLD (hyperlipidemia)   . HTN (hypertension)   . Low back pain   . Osteoporosis   . Plantar fasciitis    Morton's Neuroma    Past Surgical History:  Procedure Laterality Date  . ABDOMINAL HYSTERECTOMY    . BREAST EXCISIONAL BIOPSY Bilateral 1954   NEG  . COLONOSCOPY     3/09, one sigmoid polyp; no tics   . NM MYOVIEW LTD  Oct 2011   normal, EF 89%, study limited by lung disease  . VESICOVAGINAL FISTULA CLOSURE W/ TAH      Prior to Admission medications   Medication Sig Start Date End Date Taking? Authorizing Provider  albuterol (PROAIR HFA) 108 (90 Base) MCG/ACT inhaler Inhale 2 puffs into the lungs every 6 (six) hours as needed for wheezing or shortness of breath. 12/26/17   Crecencio Mc, MD  ALPRAZolam Duanne Moron) 0.5 MG tablet TAKE ONE TABLET BY MOUTH AT BEDTIME AS NEEDED FOR ANXIETY 01/12/18   Crecencio Mc, MD  aspirin 81 MG EC tablet Take 81 mg by mouth daily.      [provider]  fexofenadine (ALLEGRA) 180 MG tablet TAKE ONE TABLET BY MOUTH EVERY DAY 01/11/18   Crecencio Mc, MD  lactulose [facility])  10 GM/15ML solution TAKE 30 ML (2 TABLESPOONSFUL) BY MOUTH EVERY 4 HOURS UNTIL CONSTIPATION IS RELIEVED 08/05/16   Crecencio Mc, MD  metoprolol succinate (TOPROL-XL) 25 MG 24 hr tablet TAKE ONE TABLET BY MOUTH EVERY DAY 01/11/18   Crecencio Mc, MD  Multiple Vitamins-Minerals (MULTIVITAMIN ADULT PO) Take 1 tablet by mouth daily.    [provider]  omeprazole (PRILOSEC) 40 MG capsule TAKE ONE CAPSULE BY MOUTH EVERY DAY 01/11/18   Crecencio Mc, MD  sertraline (ZOLOFT) 50 MG tablet TAKE ONE TABLET BY MOUTH EVERY DAY 01/11/18   Crecencio Mc, MD  simvastatin (ZOCOR) 40 MG tablet Take 1 tablet (40 mg total) by mouth daily. 04/09/18   Crecencio Mc, MD     Family History  Problem Relation Age of Onset  . Breast cancer Mother 37  . Aneurysm Father   . Lung cancer Maternal Uncle   . Gallbladder disease Maternal Aunt   . Breast cancer Maternal Aunt      Social History   Tobacco Use  . Smoking status: Former Smoker    Packs/day: 1.00    Years: 25.00    Pack years: 25.00    Types: Cigarettes    Last attempt to quit: 08/15/1985    Years since quitting: 32.7  . Smokeless tobacco: Never Used  . Tobacco comment: quit 20 years ago -started at age 71  Substance Use Topics  . Alcohol use: No    Alcohol/week: 0.0 oz  . Drug use: No    Allergies as of 05/08/2018 - Review Complete 05/03/2018  Allergen Reaction Noted  . Codeine    . Demerol Nausea And Vomiting 08/16/2011  . Hctz [hydrochlorothiazide] Other (See Comments) 08/08/2014  . Levaquin [levofloxacin in d5w] Nausea And Vomiting 05/22/2013  . Mirtazapine  09/13/2016  . Propoxyphene n-acetaminophen    . Sulfonamide derivatives  09/30/2010    Review of Systems:    Cannot asses ROS   Physical Exam:  Vital signs in last 24 hours: Temp:  [95.6 F (35.3 C)-99 F (37.2 C)] 95.6 F (35.3 C) (05/05 0734) Pulse Rate:  [58-81] 63 (05/05 0915) Resp:  [14-20] 18 (05/05 0915) BP: (69-134)/(38-60) 88/43 (05/05 0915) SpO2:  [93 %-100 %] 99 % (05/05 0915) Weight:  [110 lb (49.9 kg)-128 lb 1.4 oz (58.1 kg)] 128 lb 1.4 oz (58.1 kg) (05/05 0734)   General:   Complains of pain  Head:  Normocephalic and atraumatic. Eyes:   No icterus.   Conjunctiva pink. PERRLA. Ears:  Normal auditory acuity. Neck:  Supple; no masses or thyroidomegaly Lungs: Respirations even and unlabored. Lungs clear to auscultation bilaterally.   No wheezes, crackles, or rhonchi.  Heart:  Regular rate and rhythm;  Without murmur, clicks, rubs or gallops Abdomen: mildly distended, drain rt side, midline incision, mild gneralized tenderness, no guarding or rigidity , B@+ Neurologic:  Alert and oriented x3;  grossly  normal neurologically. Skin:  Intact without significant lesions or rashes. Cervical Nodes:  No significant cervical adenopathy. Psych:  Drowsy   LAB RESULTS: Recent Labs    04/27/2018 0100 04/29/2018 0805  WBC 6.7 1.4*  HGB 13.7 13.8  HCT 39.7 40.7  PLT 276 287   BMET Recent Labs    05/05/2018 0100  NA 134*  K 3.4*  CL 99*  CO2 28  GLUCOSE 167*  BUN 19  CREATININE 0.93  CALCIUM 8.8*   LFT Recent Labs    04/20/2018 0100  PROT 6.5  ALBUMIN 4.0  AST 45*  ALT 20  ALKPHOS 62  BILITOT 0.6   PT/INR Recent Labs    05/16/2018 0805  LABPROT 15.7*  INR 1.26    STUDIES: Ct Abdomen Pelvis Wo Contrast  Result Date: 04/20/2018 CLINICAL DATA:  Diffuse abdominal pain EXAM: CT ABDOMEN AND PELVIS WITHOUT CONTRAST TECHNIQUE: Multidetector CT imaging of the abdomen and pelvis was performed following the standard protocol without IV contrast. COMPARISON:  CT 10/27/2016, 04/20/2016 FINDINGS: Lower chest: Lung bases demonstrate no acute consolidation or pleural effusion. Heart size within normal limits. Trace pericardial effusion. Mild fluid-filled distention of the distal esophagus. Hepatobiliary: Small gallstones no biliary dilatation or focal hepatic abnormality. Pancreas: Large amount of peripancreatic gas.  No ductal dilatation Spleen: Normal in size without focal abnormality. Adrenals/Urinary Tract: Adrenal glands are within normal limits. No hydronephrosis. Small exophytic lesion mid left kidney, no change and likely benign. Mildly atrophic kidneys. Bladder is distended. Stomach/Bowel: Stomach nonenlarged. Abnormal wall thickening involving the duodenum and proximal jejunum. Sigmoid colon diverticular disease without acute inflammation. Vascular/Lymphatic: Moderate aortic atherosclerosis. No aneurysmal dilatation. 16 mm right femoral node. Reproductive: Status post hysterectomy. No adnexal masses. Other: Small foci of pneumoperitoneum at the porta hepatis. Small amount of complex ascites  around the liver spleen and within the bilateral colic gutters with small foci of gas in the left gutter fluid. Moderate to large amount of extraluminal air, within the retroperitoneum around the pancreas with small amounts of pneumoperitoneum noted. Musculoskeletal: Grade 1 anterolisthesis of L4 on L5. Multilevel degenerative changes without acute or suspicious abnormality. IMPRESSION: 1. Findings consistent with hollow viscus perforation with moderate to large amount of retroperitoneal gas and small amounts of intraperitoneal free air. Potential source is abnormally thickened duodenum and proximal jejunal bowel loops; bowel thickening could be secondary to infection, inflammatory bowel disease, or ischemia. Could consider peptic ulcer disease although distribution of extraluminal gas is considered somewhat atypical for this. 2. Small amount of complex ascites within the upper abdomen. 3. Gallstones 4. Sigmoid colon diverticular disease without acute inflammation Critical Value/emergent results were called by telephone at the time of interpretation on 05/09/2018 at 2:55 am to Dr. Carrie Mew , who verbally acknowledged these results. Electronically Signed   By: Donavan Foil M.D.   On: 05/17/2018 02:55   Dg Chest Port 1 View  Result Date: 04/24/2018 CLINICAL DATA:  83 year old female with history of known COPD. Pneumoperitoneum noted on recent CT the abdomen and pelvis. EXAM: PORTABLE CHEST 1 VIEW COMPARISON:  Chest x-ray 06/29/2007. FINDINGS: A nasogastric tube is seen extending into the stomach, however, the tip of the nasogastric tube extends below the lower margin of the image. Surgical drain seen projecting over the left upper quadrant of the abdomen, as well as several skin staples. Lung volumes are low. Patchy opacities throughout the lung bases are favored to reflect areas of atelectasis, although some airspace consolidation is not entirely excluded. Trace bilateral pleural effusions. No evidence of  pulmonary edema. Heart size is normal. The patient is rotated to the left on today's exam, resulting in distortion of the mediastinal contours and reduced diagnostic sensitivity and specificity for mediastinal pathology. Aortic atherosclerosis. IMPRESSION: 1. Support apparatus, as above. 2. Trace bilateral pleural effusions. 3. Bibasilar opacities likely reflect areas of subsegmental atelectasis, although developing airspace consolidation is not entirely excluded. Attention on follow-up studies is recommended. 4. Aortic atherosclerosis. Electronically Signed   By: Vinnie Langton M.D.   On: 05/03/2018 08:38      Impression / Plan:   Caitlin Mason  Caitlin Mason is a 83 y.o. y/o female admitted via the ER with abdominal pain hypotension and imaging suggestive of retroperitoneal free air.  Patient underwent emergent exploratory laparotomy and was found to have biliary peritonitis, pancreatitis subsequently underwent cholecystectomy and debridement of pancreatic necrosis and takedown of splenic flexure. Unclear of the etiology- with normal LFT's makes gall stone pancreatitis less likely .  Review of the labs does not show any biliary obstruction with normal bilirubin and alkaline phosphatase.  The common bile duct was not dilated on the initial CT scan.   I have been called for an ERCP due to concerns of pancreatic ductal disruption noted during surgery .  Plan  1. IV ringer lactate  2. Analgesia ? PCA needed she is in pain  3. NG tube - keep NPO for now.  4. Serial abdominal exam  5. When this episode resolves will need MRI/MRCP to r/o any pancreatic tumor as the cause for pancreatitis.  6. Check TGL and IGG4 levels.  7. Discussed with Dr Allen Norris - he does not do this particular procedure who in turn got in touch with Zacarias Pontes advanced endoscopist who recommended transfer to [facility] or [facility]. I subsequently have informed ICU attending and he will work on the transfer.   Thank you for involving me in the care of this  patient.      LOS: 0 days   Caitlin Bellows, MD  05/03/2018, 9:42 AM

## 2018-04-22 NOTE — H&P (Signed)
Patient ID: Caitlin Mason, female   DOB: Dec 10, 1935, 83 y.o.   MRN: [id number]  HPI Caitlin Mason is a 83 y.o. female history of COPD with an acute onset of abdominal pain.  Patient reports the pain started about 8 hours ago.  Pain is diffuse sharp, severe and constant.  Pain worsens with certain movement and seems to be improved when she leans forward.  Became hypotensive but responded to 1 L bolus.  Now again she is hypotensive.  She denies any  diarrhea.  She does have some nausea and some vomiting.  No fevers or chills. Case discussed with Dr. Enrigue Catena and radiologist.  CT scan personally reviewed showing evidence of retroperitoneal and free air.  There is no frank evidence of necrotizing pancreatitis although this is limited due to the lack of contrast.  There is some gallstones.  Is also some ascites. He also reports she takes [employer].  She also takes a daily aspirin. sSHe lives independently and is able to make her own decisions. Had a previous abdominal hysterectomy.  HPI  Past Medical History:  Diagnosis Date  . Actinic keratosis    back, upper extremities: s/p liquid nitro 01/29/06  . Cataract    s/p extraction 2007, 2005  . Cholelithiasis    asymptomatic  . COPD (chronic obstructive pulmonary disease) ([facility])   . 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 Depression   . Diverticulosis   . HLD (hyperlipidemia)   . HTN (hypertension)   . Low back pain   . Osteoporosis   . Plantar fasciitis    Morton's Neuroma    Past Surgical History:  Procedure Laterality Date  . ABDOMINAL HYSTERECTOMY    . BREAST EXCISIONAL BIOPSY Bilateral 1954   NEG  . COLONOSCOPY     3/09, one sigmoid polyp; no tics   . NM MYOVIEW LTD  Oct 2011   normal, EF 89%, study limited by lung disease  . VESICOVAGINAL FISTULA CLOSURE W/ TAH      Family History  Problem Relation Age of Onset  . Breast cancer Mother 55  . Aneurysm Father   . Lung cancer  Maternal Uncle   . Gallbladder disease Maternal Aunt   . Breast cancer Maternal Aunt     Social History Social History   Tobacco Use  . Smoking status: Former Smoker    Packs/day: 1.00    Years: 25.00    Pack years: 25.00    Types: Cigarettes    Last attempt to quit: 08/15/1985    Years since quitting: 32.7  . Smokeless tobacco: Never Used  . Tobacco comment: quit 20 years ago -started at age 67  Substance Use Topics  . Alcohol use: No    Alcohol/week: 0.0 oz  . Drug use: No    Allergies  Allergen Reactions  . Codeine     REACTION: giup set  . Demerol Nausea And Vomiting  . Hctz [Hydrochlorothiazide] Other (See Comments)    Hyponatremia   . Levaquin [Levofloxacin In D5w] Nausea And Vomiting  . Mirtazapine     Caused severe hyperactivity patient could not sleep.  Marland Kitchen Propoxyphene N-Acetaminophen     REACTION: gi upset  . Sulfonamide Derivatives     REACTION: gi upset    Current Facility-Administered Medications  Medication Dose Route Frequency Provider Last Rate Last Dose  . sodium chloride 0.9 % bolus 1,000 mL  1,000 mL Intravenous Once Pabon, Marjory Lies, MD [phone]  mL/hr at 04/25/2018 0326 1,000 mL at 05/06/2018 0326   Current Outpatient Medications  Medication Sig Dispense Refill  . albuterol (PROAIR HFA) 108 (90 Base) MCG/ACT inhaler Inhale 2 puffs into the lungs every 6 (six) hours as needed for wheezing or shortness of breath. 6.7 g 1  . ALPRAZolam (XANAX) 0.5 MG tablet TAKE ONE TABLET BY MOUTH AT BEDTIME AS NEEDED FOR ANXIETY 30 tablet 5  . aspirin 81 MG EC tablet Take 81 mg by mouth daily.      . fexofenadine (ALLEGRA) 180 MG tablet TAKE ONE TABLET BY MOUTH EVERY DAY 90 tablet 2  . lactulose (CHRONULAC) 10 GM/15ML solution TAKE 30 ML (2 TABLESPOONSFUL) BY MOUTH EVERY 4 HOURS UNTIL CONSTIPATION IS RELIEVED 236 mL 3  . metoprolol succinate (TOPROL-XL) 25 MG 24 hr tablet TAKE ONE TABLET BY MOUTH EVERY DAY 30 tablet 3  . Multiple Vitamins-Minerals (MULTIVITAMIN ADULT PO)  Take 1 tablet by mouth daily.    Marland Kitchen omeprazole (PRILOSEC) 40 MG capsule TAKE ONE CAPSULE BY MOUTH EVERY DAY 30 capsule 3  . sertraline (ZOLOFT) 50 MG tablet TAKE ONE TABLET BY MOUTH EVERY DAY 30 tablet 2  . simvastatin (ZOCOR) 40 MG tablet Take 1 tablet (40 mg total) by mouth daily. 90 tablet 1     Review of Systems Full ROS  was asked and was negative except for the information on the HPI  Physical Exam Blood pressure (!) 85/38, pulse 64, temperature 99 F (37.2 C), temperature source Oral, resp. rate 19, height 5' (1.524 m), weight 49.9 kg (110 lb), SpO2 98 %. CONSTITUTIONAL: elderly female unable to be comfortable due to pain EYES: Pupils are equal, round, and reactive to light, Sclera are non-icteric. EARS, NOSE, MOUTH AND THROAT: The oropharynx is clear. The oral mucosa is pink and moist. Hearing is intact to voice. LYMPH NODES:  Lymph nodes in the neck are normal. RESPIRATORY:  Lungs are clear. There is normal respiratory effort, with equal breath sounds bilaterally, and without pathologic use of accessory muscles. CARDIOVASCULAR: Heart is regular without murmurs, gallops, or rubs. GI: The abdomen is  soft, recently tender to palpation diffusely.  Positive rebound tenderness.  Positive Rovsing sign  Distended .  GU: Rectal deferred.   MUSCULOSKELETAL: Normal muscle strength and tone. No cyanosis or edema.   SKIN: Turgor is good and there are no pathologic skin lesions or ulcers. NEUROLOGIC: Motor and sensation is grossly normal. Cranial nerves are grossly intact. PSYCH:  Oriented to person, place and time. Affect is normal.  Data Reviewed  I have personally reviewed the patient's imaging, laboratory findings and medical records.    Assessment/Plan   83 year old female with an acute abdomen.  Etiology will include perforated hollow viscus versus necrotizing pancreatitis.  She is hypotensive and with peritoneal signs.  He is showing early signs of sepsis.  We will aggressively  resuscitate her, start Zosyn, NG tube Foley catheter on perform emergent laparotomy.. Discussed with the patient in detail about the nature of her disease.  And the severity and gravity of her condition.  Alternatively we could manage her nonoperatively but I will advocate there is only if she chooses to be palliative care and comfort care. She wishes to have surgical intervention.  Discussed with the patient in detail about risk, benefits and possible complications including but not limited to: Bleeding, infection, re-interventions, perioperative mortality, prolonged hospitalization and ICU care including ventilatory requirement. She understands and wishes to proceed. Currently there is no family available. Given her current  condition and physiological state as well as a she is high risk for any perioperative complication, morbidity and mortality.  Patient understands. I have d/w the OR in detail the situation and they are setting up the room.  Caroleen Hamman, MD FACS General Surgeon 05/06/2018, 3:32 AM

## 2018-04-22 NOTE — ED Notes (Signed)
Attempted to call pt's daughter Juliann Pulse with pt's consent, no answer.

## 2018-04-22 NOTE — Transfer of Care (Signed)
Immediate Anesthesia Transfer of Care Note  Patient: Caitlin Mason  Procedure(s) Performed: EXPLORATORY LAPAROTOMY (N/A ) CHOLECYSTECTOMY (N/A Abdomen)  Patient Location: PACU  Anesthesia Type:General  Level of Consciousness: drowsy  Airway & Oxygen Therapy: Patient connected to face mask oxygen  Post-op Assessment: Report given to RN and Post -op Vital signs reviewed and stable  Post vital signs: Reviewed and stable  Last Vitals:  Vitals Value Taken Time  BP 134/57 05/13/2018  6:52 AM  Temp 36.1 C 05/13/2018  6:52 AM  Pulse 78 05/09/2018  6:53 AM  Resp 18 05/06/2018  6:53 AM  SpO2 100 % 05/04/2018  6:53 AM  Vitals shown include unvalidated device data.  Last Pain:  Vitals:   04/21/2018 0652  TempSrc: Temporal  PainSc:          Complications: No apparent anesthesia complications

## 2018-04-22 NOTE — Progress Notes (Signed)
Pt given ordered dose of 25 mcg of Fentanyl for pain, as she had a PICC line placed and levophed was able to be turned up to support BP. Shortly after giving fentanyl, pt had drop in respirations, and was not responding despite her eyes being open and gazing upward. Charge RN and ICU called to bedside. Dr. Soyla Murphy gave verbal order for 0.4mg  of narcan, which was administered. Within 1-2 minutes, pt became responsive, however still drowsy and not taking very deep breaths. Dr. Soyla Murphy gave order for bipap to be placed. Respiratory came to room to place pt on bipap. Pt very drowsy, but responds to voice. Pt awaiting bed at [facility] currently. Family at bedside. Will continue to monitor closely.

## 2018-04-22 NOTE — Consult Note (Signed)
Pharmacy Antibiotic Note  Caitlin Mason is a 83 y.o. female admitted on 04/30/2018 with necrotizing pancreatitis.  Pharmacy has been consulted for meropenem dosing.  5/5 PM: Pharmacy consulted for Vancomycin dosing for biliary peritonitis.   Plan: Ke: 0.037   Vd: 40.6   T1/2: 18.7  Vancomycin 1g IV x 1 given. Start Vancomycin 750mg  IV every 24 hours with 11 hour stack dosing. Calculated trough at Css is 15. Trough level ordered prior to 4th dose.   Continue Meropenem 1g IV every 12 hours based on current CrCl <82ml/min   Height: 5' (152.4 cm) Weight: 128 lb 1.4 oz (58.1 kg) IBW/kg (Calculated) : 45.5  Temp (24hrs), Avg:97.4 F (36.3 C), Min:95.6 F (35.3 C), Max:99 F (37.2 C)  Recent Labs  Lab 05/09/2018 0100 05/13/2018 0805 04/19/2018 0827 05/07/2018 1119  WBC 6.7 1.4*  --   --   CREATININE 0.93 0.90  --   --   LATICACIDVEN 1.6  --  4.9* 2.9*    Estimated Creatinine Clearance: 39.1 mL/min (by C-G formula based on SCr of 0.9 mg/dL).    Allergies  Allergen Reactions  . Codeine     REACTION: giup set  . Demerol Nausea And Vomiting  . Hctz [Hydrochlorothiazide] Other (See Comments)    Hyponatremia   . Levaquin [Levofloxacin In D5w] Nausea And Vomiting  . Mirtazapine     Caused severe hyperactivity patient could not sleep.  Marland Kitchen Propoxyphene N-Acetaminophen     REACTION: gi upset  . Sulfonamide Derivatives     REACTION: gi upset    Antimicrobials this admission: 5/5 Zosyn 3.375  >> x 1  5/5 meropenem >>   Microbiology results: 5/5 BCx: pending 5/5 MRSA PCR: neg   Thank you for allowing pharmacy to be a part of this patient's care.  Pernell Dupre, PharmD, BCPS Clinical Pharmacist 05/14/2018 7:04 PM

## 2018-04-22 NOTE — ED Notes (Signed)
Surgeon at bedside, systolic blood pressure in 50s. Additional iv started, fluid bolus initiated.

## 2018-04-22 NOTE — Progress Notes (Signed)
CH provided prayer for patient. CH was unable to speak with patient. [facility] will leave a note to follow-up with patient with unit [facility].

## 2018-04-22 NOTE — ED Notes (Signed)
Pt has hx of gallstones in EMR but denies this, EMR hx of diverticulosis and reports this pain is different, pt reflect pain to any touch of abdomen - however slight - pt indicates lateral pain when asked to identify the pain  Pt also reporting pain from bulging disc

## 2018-04-22 NOTE — ED Notes (Signed)
Report received from [place], [place]

## 2018-04-22 NOTE — Consult Note (Addendum)
PULMONARY / CRITICAL CARE MEDICINE   Name: Caitlin Mason MRN: [id number] DOB: 12-Sep-1935    ADMISSION DATE:  05/08/2018 CONSULTATION DATE: 04/22/2018  REFERRING MD: Dr. Marcille Blanco  CHIEF COMPLAINT: Diffuse Abdominal Pain   HISTORY OF PRESENT ILLNESS:   This is an 83 yo female with a PMH of Osteoporosis, HTN, Hyperlipidemia, Diverticulosis, Depression, COPD, Cholelithiasis, GERD, and Actinic Keratosis.  She presented to [facility] ER via EMS on 05/5 with diffuse abdominal pain onset of symptoms 3-4 hours after eating.  Per ER notes the pt reported having a normal bowel movement on the morning of 05/4, however she did endorse nausea and vomiting x2. Per H&P she reported taking aspirin daily and Goody Powders.  Upon EMS arrival at pts home her bp was 90/50, however following fluid resuscitation bp improved. CT Abd Pelvis concerning for hollow perforation with moderate to large amount of retroperitoneal gas and small amounts of intraperitoneal free air.  Therefore, surgery consulted and pt emergently transported to OR.  Exploratory laparotomy revealed biliary peritonitis, pancreatitis w disrupted duct body and tail of the pancreas, no evidence of bowel perforation, necrotic tissue eroding into the mesentery of the large bowel and omentum. Therefore, debridement of pancreatic necrosis performed with placement of blake drains x2 and takedown of splenic flexure.  Pt admitted to ICU postop mechanically intubated PCCM consulted for vent management.  Patient arrived to ICU post operative, extubated, no distress with SBP 87.  PAST MEDICAL HISTORY :  She  has a past medical history of Actinic keratosis, Cataract, Cholelithiasis, COPD (chronic obstructive pulmonary disease) ([place]), Cystoid macular degeneration of right eye ([place] 2012), Depression, Diverticulosis, HLD (hyperlipidemia), HTN (hypertension), Low back pain, Osteoporosis, and Plantar fasciitis.  PAST SURGICAL HISTORY: She  has a past surgical history that  includes Colonoscopy; Vesicovaginal fistula closure w/ TAH; Abdominal hysterectomy; NM MYOV[facility] LTD (Oct 2011); and Breast excisional biopsy (Bilateral, 1954).  Allergies  Allergen Reactions  . Codeine     REACTION: giup set  . Demerol Nausea And Vomiting  . Hctz [Hydrochlorothiazide] Other (See Comments)    Hyponatremia   . Levaquin [Levofloxacin In D5w] Nausea And Vomiting  . Mirtazapine     Caused severe hyperactivity patient could not sleep.  Marland Kitchen Propoxyphene N-Acetaminophen     REACTION: gi upset  . Sulfonamide Derivatives     REACTION: gi upset    No current facility-administered medications on file prior to encounter.    Current Outpatient Medications on File Prior to Encounter  Medication Sig  . albuterol (PROAIR HFA) 108 (90 Base) MCG/ACT inhaler Inhale 2 puffs into the lungs every 6 (six) hours as needed for wheezing or shortness of breath.  . ALPRAZolam (XANAX) 0.5 MG tablet TAKE ONE TABLET BY MOUTH AT BEDTIME AS NEEDED FOR ANXIETY  . aspirin 81 MG EC tablet Take 81 mg by mouth daily.    . fexofenadine (ALLEGRA) 180 MG tablet TAKE ONE TABLET BY MOUTH EVERY DAY  . lactulose (CHRONULAC) 10 GM/15ML solution TAKE 30 ML (2 TABLESPOONSFUL) BY MOUTH EVERY 4 HOURS UNTIL CONSTIPATION IS RELIEVED  . metoprolol succinate (TOPROL-XL) 25 MG 24 hr tablet TAKE ONE TABLET BY MOUTH EVERY DAY  . Multiple Vitamins-Minerals (MULTIVITAMIN ADULT PO) Take 1 tablet by mouth daily.  Marland Kitchen omeprazole (PRILOSEC) 40 MG capsule TAKE ONE CAPSULE BY MOUTH EVERY DAY  . sertraline (ZOLOFT) 50 MG tablet TAKE ONE TABLET BY MOUTH EVERY DAY  . simvastatin (ZOCOR) 40 MG tablet Take 1 tablet (40 mg total) by mouth daily.  FAMILY HISTORY:  Her indicated that her mother is deceased. She indicated that her father is deceased. She indicated that the status of her maternal aunt is unknown. She indicated that the status of her maternal uncle is unknown.   SOCIAL HISTORY: She  reports that she quit smoking about 32  years ago. Her smoking use included cigarettes. She has a 25.00 pack-year smoking history. She has never used smokeless tobacco. She reports that she does not drink alcohol or use drugs.  REVIEW OF SYSTEMS:   Unable to assess pt intubated   SUBJECTIVE:  Unable to assess pt intubated   VITAL SIGNS: BP (!) 101/43   Pulse 64   Temp 99 F (37.2 C) (Oral)   Resp 19   Ht 5' (1.524 m)   Wt 49.9 kg (110 lb)   SpO2 98%   BMI 21.48 kg/m   HEMODYNAMICS:    VENTILATOR SETTINGS:    INTAKE / OUTPUT: No intake/output data recorded.  PHYSICAL EXAMINATION: RASS of -3 post general anesthesia, no distress On [place], no distress, BEAE and no rales S1 & S2 audible and no murmur Post laparotomy, benign abdomen with no peristalses No leg edema  LABS:  BMET Recent Labs  Lab 04/21/2018 0100  NA 134*  K 3.4*  CL 99*  CO2 28  BUN 19  CREATININE 0.93  GLUCOSE 167*    Electrolytes Recent Labs  Lab 04/25/2018 0100  CALCIUM 8.8*    CBC Recent Labs  Lab 05/16/2018 0100  WBC 6.7  HGB 13.7  HCT 39.7  PLT 276    Coag's No results for input(s): APTT, INR in the last 168 hours.  Sepsis Markers Recent Labs  Lab 05/15/2018 0100  LATICACIDVEN 1.6    ABG No results for input(s): PHART, PCO2ART, PO2ART in the last 168 hours.  Liver Enzymes Recent Labs  Lab 04/28/2018 0100  AST 45*  ALT 20  ALKPHOS 62  BILITOT 0.6  ALBUMIN 4.0    Cardiac Enzymes No results for input(s): TROPONINI, PROBNP in the last 168 hours.  Glucose No results for input(s): GLUCAP in the last 168 hours.  Imaging Ct Abdomen Pelvis Wo Contrast  Result Date: 05/08/2018 CLINICAL DATA:  Diffuse abdominal pain EXAM: CT ABDOMEN AND PELVIS WITHOUT CONTRAST TECHNIQUE: Multidetector CT imaging of the abdomen and pelvis was performed following the standard protocol without IV contrast. COMPARISON:  CT 10/27/2016, 04/20/2016 FINDINGS: Lower chest: Lung bases demonstrate no acute consolidation or pleural effusion.  Heart size within normal limits. Trace pericardial effusion. Mild fluid-filled distention of the distal esophagus. Hepatobiliary: Small gallstones no biliary dilatation or focal hepatic abnormality. Pancreas: Large amount of peripancreatic gas.  No ductal dilatation Spleen: Normal in size without focal abnormality. Adrenals/Urinary Tract: Adrenal glands are within normal limits. No hydronephrosis. Small exophytic lesion mid left kidney, no change and likely benign. Mildly atrophic kidneys. Bladder is distended. Stomach/Bowel: Stomach nonenlarged. Abnormal wall thickening involving the duodenum and proximal jejunum. Sigmoid colon diverticular disease without acute inflammation. Vascular/Lymphatic: Moderate aortic atherosclerosis. No aneurysmal dilatation. 16 mm right femoral node. Reproductive: Status post hysterectomy. No adnexal masses. Other: Small foci of pneumoperitoneum at the porta hepatis. Small amount of complex ascites around the liver spleen and within the bilateral colic gutters with small foci of gas in the left gutter fluid. Moderate to large amount of extraluminal air, within the retroperitoneum around the pancreas with small amounts of pneumoperitoneum noted. Musculoskeletal: Grade 1 anterolisthesis of L4 on L5. Multilevel degenerative changes without acute or suspicious abnormality.  IMPRESSION: 1. Findings consistent with hollow viscus perforation with moderate to large amount of retroperitoneal gas and small amounts of intraperitoneal free air. Potential source is abnormally thickened duodenum and proximal jejunal bowel loops; bowel thickening could be secondary to infection, inflammatory bowel disease, or ischemia. Could consider peptic ulcer disease although distribution of extraluminal gas is considered somewhat atypical for this. 2. Small amount of complex ascites within the upper abdomen. 3. Gallstones 4. Sigmoid colon diverticular disease without acute inflammation Critical Value/emergent  results were called by telephone at the time of interpretation on 05/02/2018 at 2:55 am to Dr. Carrie Mew , who verbally acknowledged these results. Electronically Signed   By: Donavan Foil M.D.   On: 05/01/2018 02:55   STUDIES:  CT Abd Pelvis 05/5>>Findings consistent with hollow viscus perforation with moderate to large amount of retroperitoneal gas and small amounts of intraperitoneal free air. Potential source is abnormally thickened duodenum and proximal jejunal bowel loops; bowel thickening could be secondary to infection, inflammatory bowel disease, or ischemia. Could consider peptic ulcer disease although distribution of extraluminal gas is considered somewhat atypical for this. Small amount of complex ascites within the upper abdomen. Gallstones Sigmoid colon diverticular disease without acute inflammation  CULTURES: Blood x2 05/5>>  ANTIBIOTICS: Zosyn 05/5 x1 dose Meropenem 05/6>>  SIGNIFICANT EVENTS: 05/5-Pt admitted to ICU s/p exploratory laparotomy mechanically intubated  LINES/TUBES: ETT 05/5>>  ASSESSMENT / PLAN:  PULMONARY A: Post extubation, Tolerating [place] with no distress Monitor work of breathing  CARDIOVASCULAR A:  Hypotension secondary to sepsis and meds  P:  Optimize volume and consider presser Continuous telemetry monitoring  Maintain map >65 with fluid resuscitation, if bp does not improve will start pressors   RENAL A:   No acute issues  P:   Trend BMP  Replace electrolytes as indicated  Monitor UOP   GASTROINTESTINAL A:   Necrotizing Pancreatitis  P:   Keep NPO for now will defer to surgical team OG tube to LIS  SUP px: iv pepcid  GI consulted appreciate input  Trend lipase, amylase and lactic acid   HEMATOLOGIC A:   No acute issues  P:  VTE px: SCD's Trend CBC  Monitor for s/sx of bleeding  Transfuse for hgb <7   INFECTIOUS A:   Sepsis secondary to intraabdominal infection  P:   Trend WBC and monitor fever curve Trend  PCT and lactic acid  Follow cultures  Continue abx as listed above  ENDOCRINE A:   Hyperglycemia  P:   CBG's q4hrs SSI   NEUROLOGIC A:   Postop Pain  P:   Optimize analgesia   Full code  DVT & GI prophylaxis. Continue with supportive care  Critical care time 45 min

## 2018-04-22 NOTE — ED Notes (Signed)
Pt assisted with bed pan

## 2018-04-22 NOTE — Progress Notes (Signed)
I was informed a few minutes ago that the patient was to be transferred to [facility] for an advanced GI procedure.  Dr. Dahlia Byes and I had discussed a stent and/or papillotomy to decompress the biliary tree and pancreatic duct.  Apparently Dr. Daleen Squibb had spoken to Dr. Allen Norris who is the only GI physician who does ERCPs at this facility.  He he stated he would not do that in this patient and that the patient should be transferred to [facility].  The GI physicians in [place] suggested transfer to [facility].  I spoke to the trauma surgeon Dr. Rolena Infante at [facility] and to Dr. Ritta Slot, and arrangements are being made for transfer.  After Rolena Infante was going to contact her hepatobiliary surgeon and call me back.  This was communicated back to the ICU physician.

## 2018-04-22 NOTE — ED Triage Notes (Addendum)
Patient presents to Emergency Department via [facility] with complaints of diffuse abdominal pain.  Pt reports pain started about 3-4 hours ago after eating ham sandwich.  Pt reports BM this morning as "normal", pt reports nausea and vomiting x 2  Hx of GERD, HTN, appendectomy  Pt moaning in pain, reflects pain with slightest touch to belly.

## 2018-04-22 NOTE — Anesthesia Procedure Notes (Addendum)
Procedure Name: Intubation Date/Time: 04/26/2018 4:21 AM Performed by: Sherrine Maples, CRNA Pre-anesthesia Checklist: Patient identified, Suction available, Emergency Drugs available, Patient being monitored and Timeout performed Patient Re-evaluated:Patient Re-evaluated prior to induction Oxygen Delivery Method: Circle system utilized Preoxygenation: Pre-oxygenation with 100% oxygen Induction Type: IV induction, Cricoid Pressure applied and Rapid sequence Ventilation: Mask ventilation without difficulty Laryngoscope Size: Miller and 2 Grade View: Grade I Tube type: Oral Tube size: 7.0 mm Number of attempts: 1 Airway Equipment and Method: Stylet Placement Confirmation: ETT inserted through vocal cords under direct vision and positive ETCO2 Secured at: 22 cm Tube secured with: Tape Dental Injury: Teeth and Oropharynx as per pre-operative assessment

## 2018-04-23 ENCOUNTER — Telehealth: Payer: Self-pay

## 2018-04-23 ENCOUNTER — Inpatient Hospital Stay: Payer: Medicare Other

## 2018-04-23 ENCOUNTER — Encounter: Payer: Self-pay | Admitting: Surgery

## 2018-04-23 DIAGNOSIS — K8591 Acute pancreatitis with uninfected necrosis, unspecified: Secondary | ICD-10-CM

## 2018-04-23 LAB — CBC WITH DIFFERENTIAL/PLATELET
Basophils Absolute: 0 10*3/uL (ref 0–0.1)
Basophils Relative: 0 %
EOS ABS: 0 10*3/uL (ref 0–0.7)
Eosinophils Relative: 1 %
HEMATOCRIT: 38.3 % (ref 35.0–47.0)
HEMOGLOBIN: 12.8 g/dL (ref 12.0–16.0)
LYMPHS ABS: 0.6 10*3/uL — AB (ref 1.0–3.6)
LYMPHS PCT: 10 %
MCH: 31.1 pg (ref 26.0–34.0)
MCHC: 33.5 g/dL (ref 32.0–36.0)
MCV: 93 fL (ref 80.0–100.0)
MONOS PCT: 4 %
Monocytes Absolute: 0.2 10*3/uL (ref 0.2–0.9)
NEUTROS ABS: 5.4 10*3/uL (ref 1.4–6.5)
NEUTROS PCT: 85 %
Platelets: 199 10*3/uL (ref 150–440)
RBC: 4.12 MIL/uL (ref 3.80–5.20)
RDW: 14.7 % — ABNORMAL HIGH (ref 11.5–14.5)
WBC: 6.3 10*3/uL (ref 3.6–11.0)

## 2018-04-23 LAB — APTT: aPTT: 49 seconds — ABNORMAL HIGH (ref 24–36)

## 2018-04-23 LAB — LIPASE, BLOOD
LIPASE: 259 U/L — AB (ref 11–51)
LIPASE: 265 U/L — AB (ref 11–51)

## 2018-04-23 LAB — GLUCOSE, CAPILLARY
GLUCOSE-CAPILLARY: 70 mg/dL (ref 65–99)
GLUCOSE-CAPILLARY: 78 mg/dL (ref 65–99)
Glucose-Capillary: 109 mg/dL — ABNORMAL HIGH (ref 65–99)

## 2018-04-23 LAB — COMPREHENSIVE METABOLIC PANEL
ALBUMIN: 2.3 g/dL — AB (ref 3.5–5.0)
ALK PHOS: 50 U/L (ref 38–126)
ALT: 158 U/L — AB (ref 14–54)
AST: 369 U/L — AB (ref 15–41)
Anion gap: 10 (ref 5–15)
BUN: 27 mg/dL — ABNORMAL HIGH (ref 6–20)
CALCIUM: 5.5 mg/dL — AB (ref 8.9–10.3)
CHLORIDE: 108 mmol/L (ref 101–111)
CO2: 14 mmol/L — AB (ref 22–32)
CREATININE: 1.74 mg/dL — AB (ref 0.44–1.00)
GFR calc Af Amer: 30 mL/min — ABNORMAL LOW (ref >60)
GFR calc non Af Amer: 26 mL/min — ABNORMAL LOW (ref >60)
GLUCOSE: 83 mg/dL (ref 65–99)
Potassium: 4.7 mmol/L (ref 3.5–5.1)
SODIUM: 132 mmol/L — AB (ref 135–145)
Total Bilirubin: 0.8 mg/dL (ref 0.3–1.2)
Total Protein: 4.2 g/dL — ABNORMAL LOW (ref 6.5–8.1)

## 2018-04-23 LAB — PROTIME-INR
INR: 2.11
Prothrombin Time: 23.5 seconds — ABNORMAL HIGH (ref 11.4–15.2)

## 2018-04-23 LAB — IGG 4: IGG 4: 22 mg/dL (ref 2–96)

## 2018-04-23 LAB — AMYLASE: Amylase: 546 U/L — ABNORMAL HIGH (ref 28–100)

## 2018-04-23 LAB — LACTIC ACID, PLASMA: Lactic Acid, Venous: 4.2 mmol/L (ref 0.5–1.9)

## 2018-04-23 LAB — MAGNESIUM: MAGNESIUM: 1.4 mg/dL — AB (ref 1.7–2.4)

## 2018-04-23 LAB — PHOSPHORUS: Phosphorus: 5.1 mg/dL — ABNORMAL HIGH (ref 2.5–4.6)

## 2018-04-23 MED ORDER — NOREPINEPHRINE BITARTRATE 1 MG/ML IV SOLN
0.0000 ug/min | INTRAVENOUS | Status: DC
  Administered 2018-04-23: 35 ug/min via INTRAVENOUS
  Administered 2018-04-23: 30 ug/min via INTRAVENOUS
  Filled 2018-04-23 (×2): qty 16

## 2018-04-23 MED ORDER — DEXMEDETOMIDINE HCL IN NACL 400 MCG/100ML IV SOLN
0.4000 ug/kg/h | INTRAVENOUS | Status: DC
Start: 2018-04-23 — End: 2018-04-23
  Administered 2018-04-23: 0.4 ug/kg/h via INTRAVENOUS

## 2018-04-23 MED ORDER — MORPHINE SULFATE (PF) 2 MG/ML IV SOLN
INTRAVENOUS | Status: AC
  Filled 2018-04-23: qty 1

## 2018-04-23 MED ORDER — DEXMEDETOMIDINE HCL IN NACL 400 MCG/100ML IV SOLN: 0.4000 ug/kg/h | INTRAVENOUS | Status: DC

## 2018-04-23 MED ORDER — NALOXONE HCL 0.4 MG/ML IJ SOLN
INTRAMUSCULAR | Status: AC
  Administered 2018-04-23: 0.4 mg via INTRAVENOUS
  Filled 2018-04-23: qty 1

## 2018-04-23 MED ORDER — NALOXONE HCL 0.4 MG/ML IJ SOLN
0.4000 mg | INTRAMUSCULAR | Status: DC | PRN
  Administered 2018-04-23: 0.4 mg via INTRAVENOUS

## 2018-04-23 MED ORDER — MORPHINE SULFATE (PF) 2 MG/ML IV SOLN
1.0000 mg | INTRAVENOUS | Status: DC | PRN
Start: 2018-04-23 — End: 2018-04-23
  Administered 2018-04-23: 1 mg via INTRAVENOUS

## 2018-04-23 MED ORDER — ALBUMIN HUMAN 25 % IV SOLN
12.5000 g | Freq: Once | INTRAVENOUS | Status: DC
  Filled 2018-04-23: qty 50

## 2018-04-23 MED ORDER — LORAZEPAM 2 MG/ML IJ SOLN
1.0000 mg | INTRAMUSCULAR | Status: DC | PRN
  Administered 2018-04-23: 1 mg via INTRAVENOUS
  Filled 2018-04-23: qty 1

## 2018-04-24 ENCOUNTER — Telehealth: Payer: Self-pay

## 2018-04-24 LAB — CALCIUM, IONIZED
CALCIUM, IONIZED, SERUM: 3.3 mg/dL — AB (ref 4.5–5.6)
Calcium, Ionized, Serum: 3.2 mg/dL — ABNORMAL LOW (ref 4.5–5.6)

## 2018-04-24 LAB — SURGICAL PATHOLOGY

## 2018-04-24 NOTE — Telephone Encounter (Signed)
Death certificate placed in MD folder.ss

## 2018-04-24 NOTE — Telephone Encounter (Signed)
[place] dropped off death certificate to be completed and signed Placed in nurse box.

## 2018-04-24 NOTE — Telephone Encounter (Signed)
Funeral home notified that death certificate ready for pickup.

## 2018-04-27 LAB — ANAEROBIC CULTURE: GRAM STAIN: NONE SEEN

## 2018-05-09 ENCOUNTER — Other Ambulatory Visit: Payer: Self-pay | Admitting: Internal Medicine

## 2018-05-19 ENCOUNTER — Other Ambulatory Visit: Payer: Self-pay | Admitting: Internal Medicine

## 2018-05-19 NOTE — Progress Notes (Signed)
CRITICAL VALUE ALERT  Critical Value:  Ca 5.5  Date & Time Notied:  5/6 @ 610  Provider Notified: Dr. Oletta Darter- message left with Gretchen @ e-link (provider involved with a Code) @ 0615  Orders Received/Actions taken: no new orders at this time- will continue to monitor pt. Closely.

## 2018-05-19 NOTE — Telephone Encounter (Signed)
Copied from [place] [phone]. Topic: General - Deceased Patient >> 05-04-18 11:05 AM Caitlin Mason, NT wrote: Reason for CRM: Patients granddaughter, [name] called this morning and states that her grandmother passed away this morning.   Route to department's PEC Pool.

## 2018-05-19 NOTE — Progress Notes (Signed)
Called E-link and spoke with Kathlee Nations, RN r/t follow lactic acid lab for pt. With AM labs. Dr. Oletta Darter was unavailable taking other pt. Calls, Genia Hotter stated she would leave him the message. Will continue to monitor pt. Closely.

## 2018-05-19 NOTE — Progress Notes (Signed)
Patient ID: Caitlin Mason, female   DOB: [birth year], 83 y.o.   MRN: [id number] Pulmonary/critical care attending  Discuss with family and granddaughter. Family wishes to proceed with comfort care measures. We'll place on morphine and Ativan as needed pushes, comfort care measures.  Hermelinda Dellen, D.O.

## 2018-05-19 NOTE — Progress Notes (Signed)
Called E-link and spoke with Elzie Rings, RN r/t pt.'s low UOP.  Discussed levo gtt rate, PMHx, day shift UOP. Elzie Rings, RN stated she would pass info along to Dr. Oletta Darter who was unavailable at the time. Will continue to monitor pt. Closely.

## 2018-05-19 NOTE — Progress Notes (Signed)
Patient with 10/10 right lower lobe abdominal pain.  08:24 fentanyl 25 mcg IV given. 08:26 patient desated to 84% and having periods of apnea.  Dr. Jefferson Fuel notified.  Narcan .4mg  IV given.

## 2018-05-19 NOTE — Progress Notes (Signed)
Ativan 1mg  IV given for comfort.

## 2018-05-19 NOTE — Plan of Care (Signed)
Pt. BP soft O/N, requiring levo gtt and pt. Received 1 L NS bolus r/t increased lactic. Pt. UOP low O/N, SpO2 stable on 2 L [place], pain controlled intermittently with fentanyl PRN's. [place] remains on for hypothermia. Multiple calls to MD r/t lab values, UOP, and BP O/N. Report given to [facility].

## 2018-05-19 NOTE — Progress Notes (Addendum)
Patient ID: Caitlin Mason, female   DOB: 07-16-1935, 83 y.o.   MRN: [id number]  Pulmonary/critical care attending  Patient with history of biliary peritonitis, perforated bowel, pancreatitis, status post exploratory laparotomy, cholecystectomy, debridement of necrotic pancreas, takedown of splenic flexure with pancreatic duct disruption. There have been ongoing discussions between surgery, GI and [facility] surgery.Overnight patient has had pressor requirements, worsening lactic acidosis, hypocalcemia, hypomagnesemia. Chest x-ray this morning shows worsening airspace disease  Vital signs: Patient is presently normotensive, has just been reversed with Narcan, also on norepinephrine  HEENT: Area patient is intermittently responsive and intermittently apneic. Clear overt labored breathing Cardiovascular: Tachycardia appreciated Pulmonary: Coarse diffuse rhonchi appreciated right greater than left Abdominal: Drains noted, no bowel sounds appreciated Extremities: No clubbing, cyanosis, edema is noted Neurologic: Clear deterioration in status with intermittent responsiveness and encephalopathy  Lactic acid is 4.2, calcium is 5.5, sodium 132, CO2 is 14, B1 27 over creatinine 1.74 [place]ma amylases 546, lipase is 265, INR is 2.11  Patient on meropenem, fluconazole, vancomycin  Patient with history of perforated bowel, biliary peritonitis, necrotizing pancreatitis, disrupted pancreatic duct, with sepsis, hypotension requiring pressors, significant protracted pain, progressive respiratory failure.     Patient had been given fentanyl for her severe pain. She'll fortunately becomes unresponsive after even low doses of pain medications are given. Narcan given to reverse. Discussed with daughter about ongoing goals of care. Daughter states she wants her to be kept comfortable. She also states that she would not want her intubated, CPR, and once her DO NOT RESUSCITATE. Pending additional family member to arrive.  Considering comfort care measures.  Hermelinda Dellen, D.O.  Critical care time 35 minutes

## 2018-05-19 NOTE — Progress Notes (Addendum)
Precedex to be started for agitation and restlessness.

## 2018-05-19 NOTE — Progress Notes (Signed)
Patient without heart tones or respirations.  Patient pronounced dead at 10:04 by Jeannie Fend, RN and witnessed by Casey Burkitt, RN.

## 2018-05-19 NOTE — Progress Notes (Signed)
Patient changed to DNR.

## 2018-05-19 NOTE — Progress Notes (Signed)
Patient with increased work of breathing.  Daughter and granddaughter at bedside and have made decision to make patient comfort care.  Dr. Jefferson Fuel notified and will place orders.

## 2018-05-19 NOTE — Progress Notes (Signed)
CRITICAL VALUE ALERT  Critical Value:  Lactic 3.6   Date & Time Notied:  05/13/2018 @ 1926  Provider Notified: called E-link, spoke with Elzie Rings, RN who left a message for Dr. Oletta Darter 5/5 @ 2000  Orders Received/Actions taken: 1 L NS bolus and STAT labs ordered. Will continue to monitor pt. Closely.

## 2018-05-19 NOTE — Progress Notes (Signed)
eLink Physician-Brief Progress Note Patient Name: Caitlin Mason DOB: 1935-12-09 MRN: [id number]   Date of Service  2018/05/08  HPI/Events of Note  Last lactic acid level = 5.6.  eICU Interventions  Continue to trend lactic acid levels.      Intervention Category Major Interventions: Acid-Base disturbance - evaluation and management  Savayah Waltrip Eugene May 08, 2018, 5:42 AM

## 2018-05-19 NOTE — Progress Notes (Signed)
CRITICAL VALUE ALERT  Critical Value:  Lactic 5.6 & Ca: 5.8  Date & Time Notied:  5/5 @ 2203  Provider Notified: Called E-link, spoke with Kathlee Nations, RN who stated Dr. Oletta Darter was looking through all the lab work.  Orders Received/Actions taken: No new orders at this time, will continue to monitor pt. Closely.

## 2018-05-19 NOTE — Progress Notes (Signed)
Koleen Nimrod daughter at bedside.  Daughter notified of changes and is speaking with Dr. Jefferson Fuel.

## 2018-05-19 NOTE — Death Summary Note (Signed)
DEATH SUMMARY   Patient Details  Name: Caitlin Mason MRN: [id number] DOB: [birth year]  Admission/Discharge Information   Admit Date:  05/11/2018  Date of Death: Date of Death: 2018/05/12  Time of Death: Time of Death: 1004  Length of Stay: 1  Referring Physician: Crecencio Mc, MD   Reason(s) for Hospitalization  Patient was an 83 yo female with a PMH of Osteoporosis, HTN, Hyperlipidemia, Diverticulosis, Depression, COPD, Cholelithiasis, GERD, and Actinic Keratosis.  She presented to [facility] ER via EMS on May 12, 2023 with diffuse abdominal pain onset of symptoms 3-4 hours after eating.  Per ER notes the pt reported having a normal bowel movement on the morning of 05/4, however she did endorse nausea and vomiting x2. Per H&P she reported taking aspirin daily and Goody Powders.  Upon EMS arrival at pts home her bp was 90/50, however following fluid resuscitation bp improved. CT Abd Pelvis concerning for hollow perforation with moderate to large amount of retroperitoneal gas and small amounts of intraperitoneal free air.  Therefore, surgery consulted and pt emergently transported to OR.  Exploratory laparotomy revealed biliary peritonitis, pancreatitis w disrupted duct body and tail of the pancreas, no evidence of bowel perforation, necrotic tissue eroding into the mesentery of the large bowel and omentum. Therefore, debridement of pancreatic necrosis performed with placement of blake drains x2 and takedown of splenic flexure.  Pt was admitted to the ICU.  Discussions between GI, surgery and [facility] surgery about possible transfer versus stenting for pancreatic ductal damage.  Unfortunately this morning when I arrived at the bedside patient was in distress, altered mental status, apneic.  Discussed with daughter about goals of care, CPR, intubation.  Patient's daughter states that her mother would not want this and wished to proceed with comfort care measures.  Patient was started on as needed morphine and Ativan  for comfort.  Patient deceased 73  Diagnoses  Preliminary cause of death:  Secondary Diagnoses (including complications and co-morbidities):  Active Problems:   Perforated bowel ([place])   Acute pancreatitis   Acute necrotizing pancreatitis   Pertinent Labs and Studies  Significant Diagnostic Studies Ct Abdomen Pelvis Wo Contrast  Result Date: 05-11-18 CLINICAL DATA:  Diffuse abdominal pain EXAM: CT ABDOMEN AND PELVIS WITHOUT CONTRAST TECHNIQUE: Multidetector CT imaging of the abdomen and pelvis was performed following the standard protocol without IV contrast. COMPARISON:  CT 10/27/2016, 04/20/2016 FINDINGS: Lower chest: Lung bases demonstrate no acute consolidation or pleural effusion. Heart size within normal limits. Trace pericardial effusion. Mild fluid-filled distention of the distal esophagus. Hepatobiliary: Small gallstones no biliary dilatation or focal hepatic abnormality. Pancreas: Large amount of peripancreatic gas.  No ductal dilatation Spleen: Normal in size without focal abnormality. Adrenals/Urinary Tract: Adrenal glands are within normal limits. No hydronephrosis. Small exophytic lesion mid left kidney, no change and likely benign. Mildly atrophic kidneys. Bladder is distended. Stomach/Bowel: Stomach nonenlarged. Abnormal wall thickening involving the duodenum and proximal jejunum. Sigmoid colon diverticular disease without acute inflammation. Vascular/Lymphatic: Moderate aortic atherosclerosis. No aneurysmal dilatation. 16 mm right femoral node. Reproductive: Status post hysterectomy. No adnexal masses. Other: Small foci of pneumoperitoneum at the porta hepatis. Small amount of complex ascites around the liver spleen and within the bilateral colic gutters with small foci of gas in the left gutter fluid. Moderate to large amount of extraluminal air, within the retroperitoneum around the pancreas with small amounts of pneumoperitoneum noted. Musculoskeletal: Grade 1 anterolisthesis of  L4 on L5. Multilevel degenerative changes without acute or suspicious abnormality. IMPRESSION: 1. Findings  consistent with hollow viscus perforation with moderate to large amount of retroperitoneal gas and small amounts of intraperitoneal free air. Potential source is abnormally thickened duodenum and proximal jejunal bowel loops; bowel thickening could be secondary to infection, inflammatory bowel disease, or ischemia. Could consider peptic ulcer disease although distribution of extraluminal gas is considered somewhat atypical for this. 2. Small amount of complex ascites within the upper abdomen. 3. Gallstones 4. Sigmoid colon diverticular disease without acute inflammation Critical Value/emergent results were called by telephone at the time of interpretation on 05/14/2018 at 2:55 am to Dr. Carrie Mew , who verbally acknowledged these results. Electronically Signed   By: Donavan Foil M.D.   On: 05/15/2018 02:55   Dg Chest Port 1 View  Result Date: May 06, 2018 CLINICAL DATA:  Atelectasis. EXAM: PORTABLE CHEST 1 VIEW COMPARISON:  05/05/2018 FINDINGS: The cardiac silhouette is normal. Prominence of the aortic knob with calcific atherosclerotic disease. Mediastinal contours appear intact. There is no evidence of lobar airspace consolidation, or pneumothorax. Mildly increased interstitial markings. Persistent peribronchial opacities, most prominent in the right lower lobe. Probable small left pleural effusion. Osseous structures are without acute abnormality. Soft tissues are grossly normal. IMPRESSION: Persistent peribronchial opacities, most prominent in the right lower lobe may represent bronchitic changes. Mild interstitial pulmonary edema. Small left pleural effusion. Tortuosity and calcific atherosclerotic disease of the aorta. Electronically Signed   By: Fidela Salisbury M.D.   On: May 06, 2018 08:57   Dg Chest Port 1 View  Result Date: 05/14/2018 CLINICAL DATA:  83 year old female with history of  known COPD. Pneumoperitoneum noted on recent CT the abdomen and pelvis. EXAM: PORTABLE CHEST 1 VIEW COMPARISON:  Chest x-ray 06/29/2007. FINDINGS: A nasogastric tube is seen extending into the stomach, however, the tip of the nasogastric tube extends below the lower margin of the image. Surgical drain seen projecting over the left upper quadrant of the abdomen, as well as several skin staples. Lung volumes are low. Patchy opacities throughout the lung bases are favored to reflect areas of atelectasis, although some airspace consolidation is not entirely excluded. Trace bilateral pleural effusions. No evidence of pulmonary edema. Heart size is normal. The patient is rotated to the left on today's exam, resulting in distortion of the mediastinal contours and reduced diagnostic sensitivity and specificity for mediastinal pathology. Aortic atherosclerosis. IMPRESSION: 1. Support apparatus, as above. 2. Trace bilateral pleural effusions. 3. Bibasilar opacities likely reflect areas of subsegmental atelectasis, although developing airspace consolidation is not entirely excluded. Attention on follow-up studies is recommended. 4. Aortic atherosclerosis. Electronically Signed   By: Vinnie Langton M.D.   On: 05/18/2018 08:38   [place] Ekg Site Rite  Result Date: 05/16/2018 If Site Rite image not attached, placement could not be confirmed due to current cardiac rhythm.   Microbiology Recent Results (from the past 240 hour(s))  Anaerobic culture     Status: None (Preliminary result)   Collection Time: 05/12/2018  5:04 AM  Result Value Ref Range Status   Specimen Description   Final    ASCITIC Performed at [facility], [address], [place], [place] [address]    Special Requests   Final    A ASCITIS Performed at [facility], [address] [place], [place] [address]    Gram Stain   Final    FEW WBC PRESENT, PREDOMINANTLY PMN NO ORGANISMS SEEN Performed at [facility], [place]  [address], [place], [place] [address]    Culture PENDING  Incomplete   Report  Status PENDING  Incomplete  Anaerobic culture     Status: None (Preliminary result)   Collection Time: 05/09/2018  5:04 AM  Result Value Ref Range Status   Specimen Description   Final    ASCITIC Performed at [facility], [address] [place], [place] [address]    Special Requests   Final    B ASCITIES Performed at [facility], [address] [place], [place] [address]    Gram Stain   Final    NO WBC SEEN FEW GRAM NEGATIVE RODS RARE GRAM POSITIVE COCCI IN PAIRS Performed at [facility], [place] [address] [place], [place] [address]    Culture PENDING  Incomplete   Report Status PENDING  Incomplete  MRSA PCR Screening     Status: None   Collection Time: 05/17/2018  7:40 AM  Result Value Ref Range Status   MRSA by PCR NEGATIVE NEGATIVE Final    Comment:        The GeneXpert MRSA Assay (FDA approved for NASAL specimens only), is one component of a comprehensive MRSA colonization surveillance program. It is not intended to diagnose MRSA infection nor to guide or monitor treatment for MRSA infections. Performed at [facility], [address] [place],  [address]     Lab Basic Metabolic Panel: Recent Labs  Lab 05/09/2018 0100 04/28/2018 0805 04/24/2018 2103 May 14, 2018 0518  NA 134* 137 131* 132*  K 3.4* 3.9 4.2 4.7  CL 99* 108 109 108  CO2 28 20* 14* 14*  GLUCOSE 167* 110* 177* 83  BUN 19 18 23* 27*  CREATININE 0.93 0.90 1.54* 1.74*  CALCIUM 8.8* 6.7* 5.8* 5.5*  MG  --  1.4* 1.4* 1.4*  PHOS  --  3.8 4.8* 5.1*   Liver Function Tests: Recent Labs  Lab 04/28/2018 0100 05/01/2018 0805 05/14/18 0518  AST 45* 423* 369*  ALT 20 132* 158*  ALKPHOS 62 52 50  BILITOT 0.6 0.8 0.8  PROT 6.5 4.0* 4.2*  ALBUMIN 4.0 2.2* 2.3*   Recent Labs  Lab 05/03/2018 0100 05/03/2018 0805 2018/05/14 0518  LIPASE 942* 522* 265*  259*  AMYLASE  --   --  546*   No results for  input(s): AMMONIA in the last 168 hours. CBC: Recent Labs  Lab 05/15/2018 0100 04/18/2018 0805 05/04/2018 2103 2018-05-14 0518  WBC 6.7 1.4* 4.8 6.3  NEUTROABS 5.5 0.9* 4.3 5.4  HGB 13.7 13.8 13.4 12.8  HCT 39.7 40.7 40.0 38.3  MCV 90.0 92.1 93.4 93.0  PLT 276 287 225 199   Cardiac Enzymes: No results for input(s): CKTOTAL, CKMB, CKMBINDEX, TROPONINI in the last 168 hours. Sepsis Labs: Recent Labs  Lab 04/24/2018 0100 04/20/2018 0805  04/27/2018 1119 04/19/2018 1850 05/18/2018 2103 2018/05/14 0518 05/14/2018 0558  WBC 6.7 1.4*  --   --   --  4.8 6.3  --   LATICACIDVEN 1.6  --    < > 2.9* 3.6* 5.6*  --  4.2*   < > = values in this interval not displayed.    Procedures/Operations  PROCEDURES: 1. Laparotomy 2. Debridement of pancreatic necrosis 3. Cholecystectomy 4. Placement of 19 FR blake drains x2 5. Takedown of splenic flexure     Kennadie Brenner May 14, 2018, 12:53 PM

## 2018-05-19 NOTE — Progress Notes (Signed)
Rectal temperature of 99.2.  Bair hugger removed.

## 2018-05-19 DEATH — deceased

## 2018-10-15 ENCOUNTER — Other Ambulatory Visit: Payer: Medicare Other

## 2018-10-16 ENCOUNTER — Ambulatory Visit: Payer: Medicare Other | Admitting: Internal Medicine
